# Patient Record
Sex: Female | Born: 1960
Health system: Southern US, Community
[De-identification: ages and names within clinical notes are randomized; demographics above are authoritative.]

## PROBLEM LIST (undated history)

## (undated) DIAGNOSIS — R1310 Dysphagia, unspecified: Principal | ICD-10-CM

## (undated) DIAGNOSIS — K5904 Chronic idiopathic constipation: Secondary | ICD-10-CM

## (undated) DIAGNOSIS — M1990 Unspecified osteoarthritis, unspecified site: Secondary | ICD-10-CM

## (undated) DIAGNOSIS — K219 Gastro-esophageal reflux disease without esophagitis: Secondary | ICD-10-CM

## (undated) HISTORY — DX: Dysphagia, unspecified: R13.10

## (undated) HISTORY — DX: Chronic idiopathic constipation: K59.04

## (undated) HISTORY — PX: KNEE SURGERY: SHX244

---

## 2002-02-13 ENCOUNTER — Ambulatory Visit (HOSPITAL_COMMUNITY): Admission: RE | Admit: 2002-02-13 | Discharge: 2002-02-13 | Payer: Self-pay | Admitting: Orthopaedic Surgery

## 2002-02-13 ENCOUNTER — Encounter: Payer: Self-pay | Admitting: Orthopaedic Surgery

## 2003-01-08 ENCOUNTER — Ambulatory Visit (HOSPITAL_COMMUNITY): Admission: RE | Admit: 2003-01-08 | Discharge: 2003-01-08 | Payer: Self-pay | Admitting: Orthopaedic Surgery

## 2003-01-08 ENCOUNTER — Encounter: Payer: Self-pay | Admitting: Orthopaedic Surgery

## 2003-05-29 ENCOUNTER — Ambulatory Visit (HOSPITAL_COMMUNITY): Admission: RE | Admit: 2003-05-29 | Discharge: 2003-05-29 | Payer: Self-pay | Admitting: Orthopaedic Surgery

## 2003-06-10 ENCOUNTER — Ambulatory Visit (HOSPITAL_COMMUNITY): Admission: RE | Admit: 2003-06-10 | Discharge: 2003-06-10 | Payer: Self-pay | Admitting: Orthopaedic Surgery

## 2004-07-03 IMAGING — US US RETROPERITONEAL COMPLETE
1 series · 14 of 25 positions shown · non-contrast
Comparison: none

CLINICAL DATA: Retention of activity in the renal collecting systems on a bone scan dated 05/29/03.
 ULTRASOUND RENAL  
 Both kidneys are well visualized and appear normal in size and configuration.  The right kidney is 10.6 cm in length and the left kidney is 10.5 cm in length.  There is no evidence of cyst, solid masses, hydronephrosis, renal calculi, or other significant abnormality.
 IMPRESSION
 Normal bilateral renal ultrasound.  The tracer retention on bone scan is felt to have been physiologic.

[Series 1: unknown · 0.34mm/px · 14 of 31 slices shown]
[im 1/31]
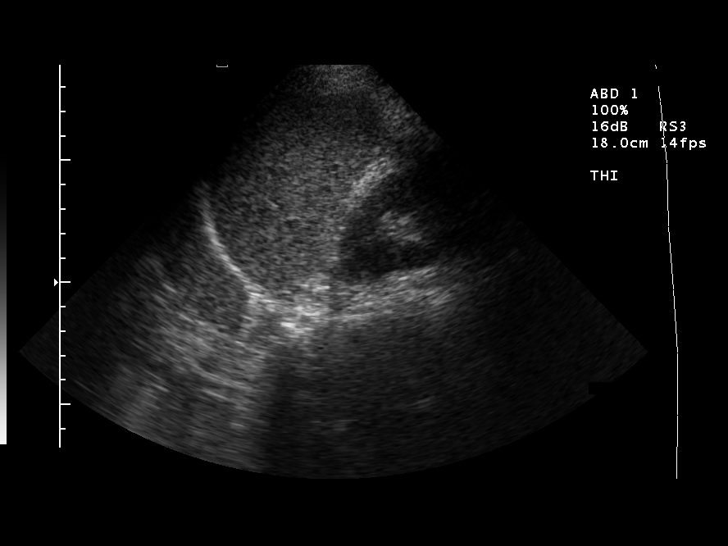
[im 3/31]
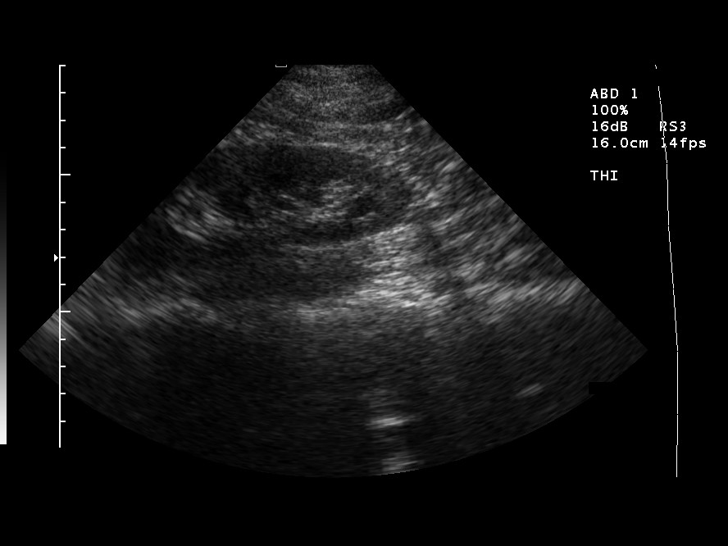
[im 6/31]
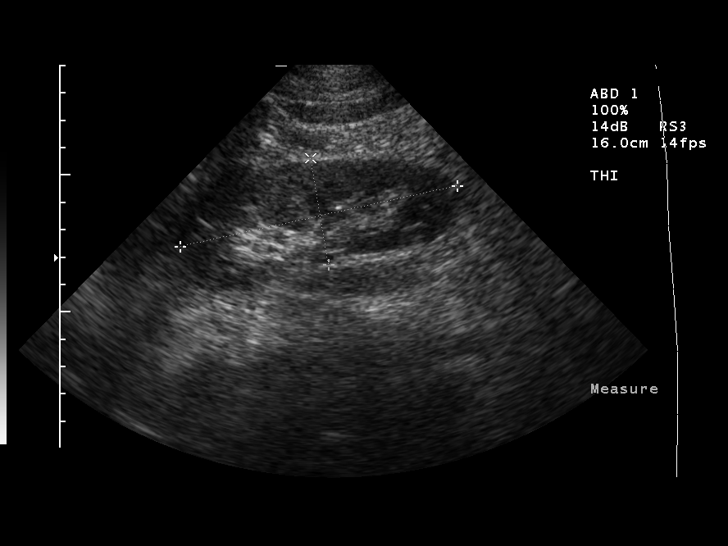
[im 8/31]
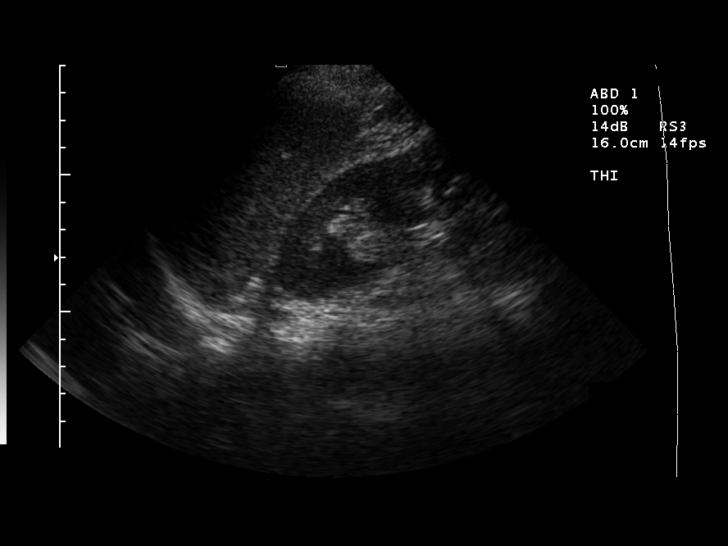
[im 11/31]
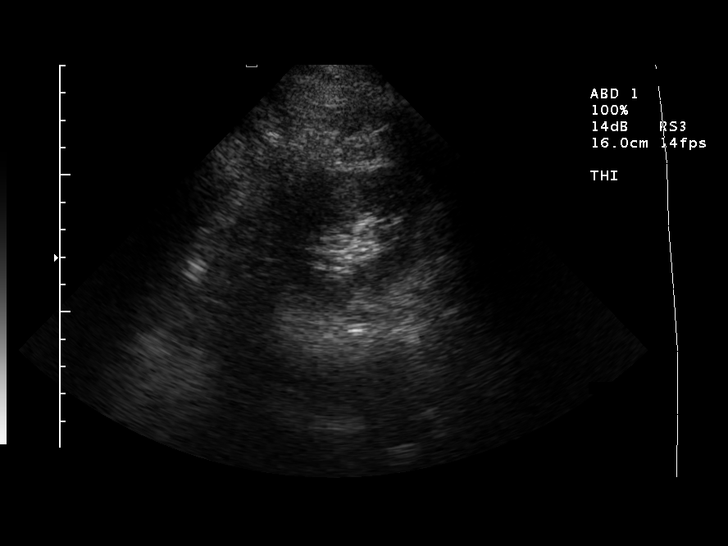
[im 12/31]
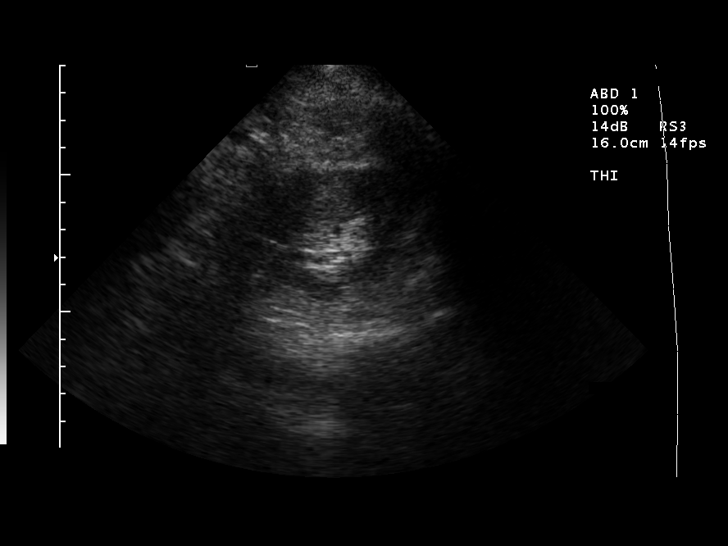
[im 14/31]
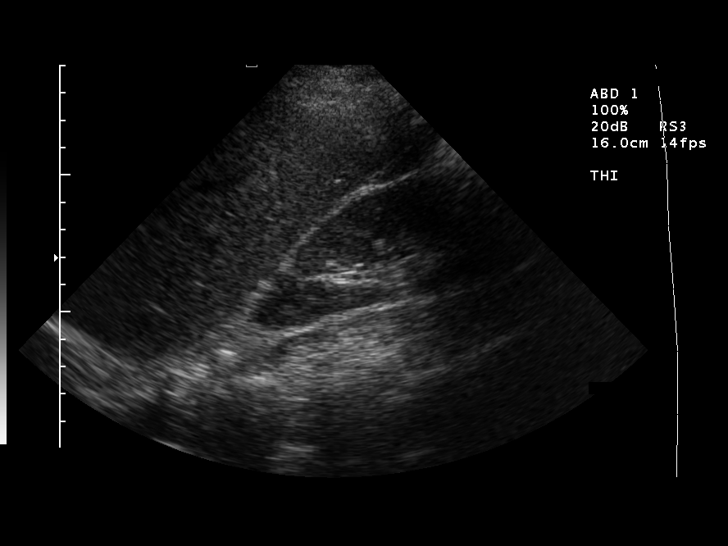
[im 17/31]
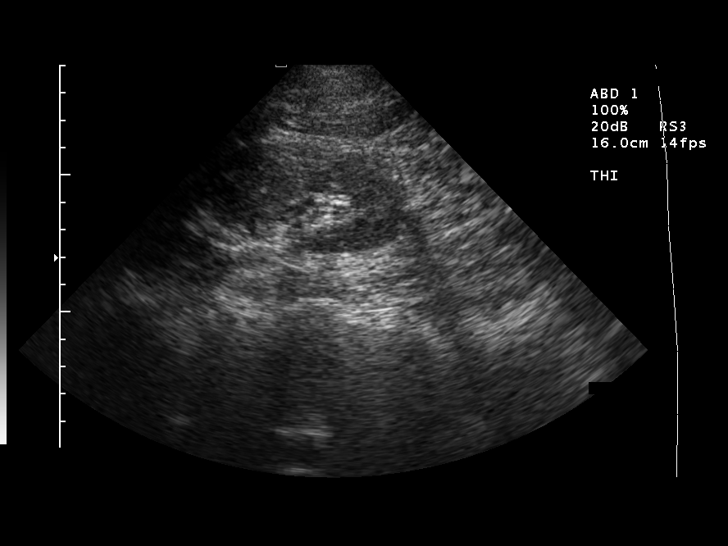
[im 19/31]
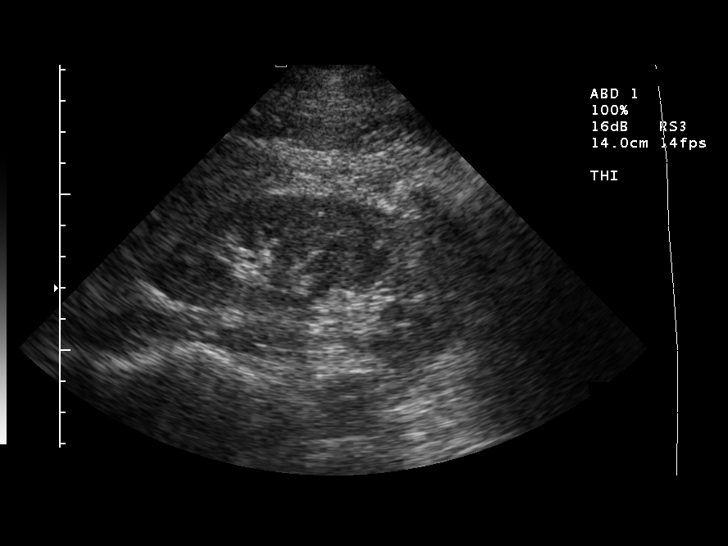
[im 21/31]
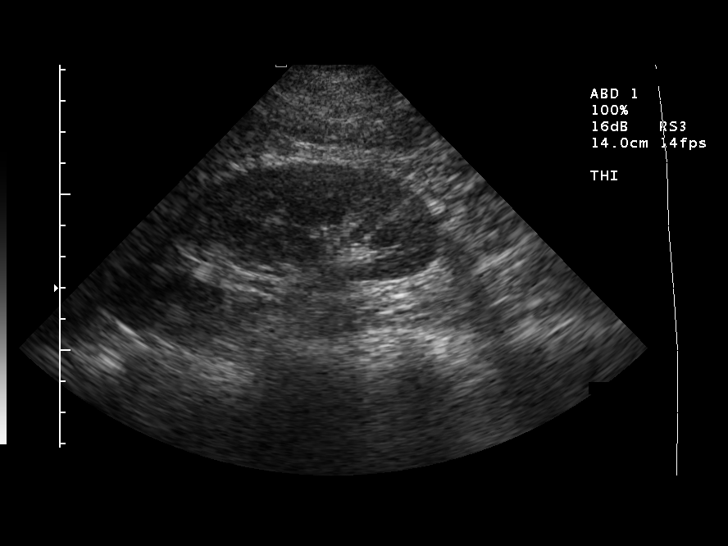
[im 23/31]
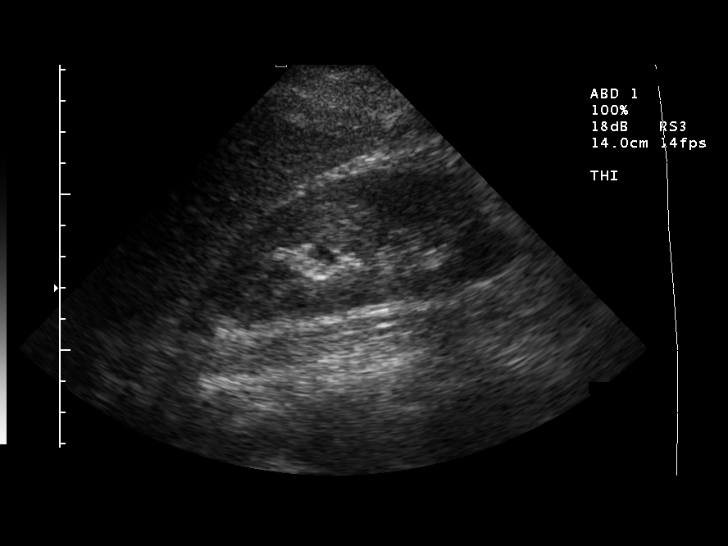
[im 26/31]
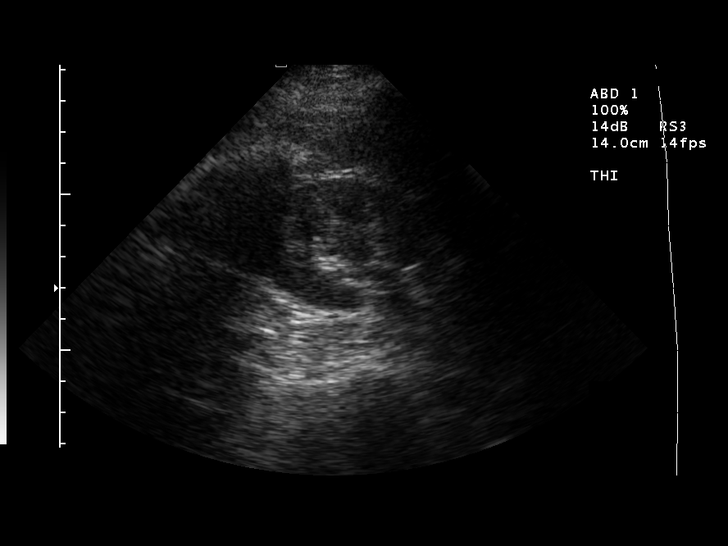
[im 28/31]
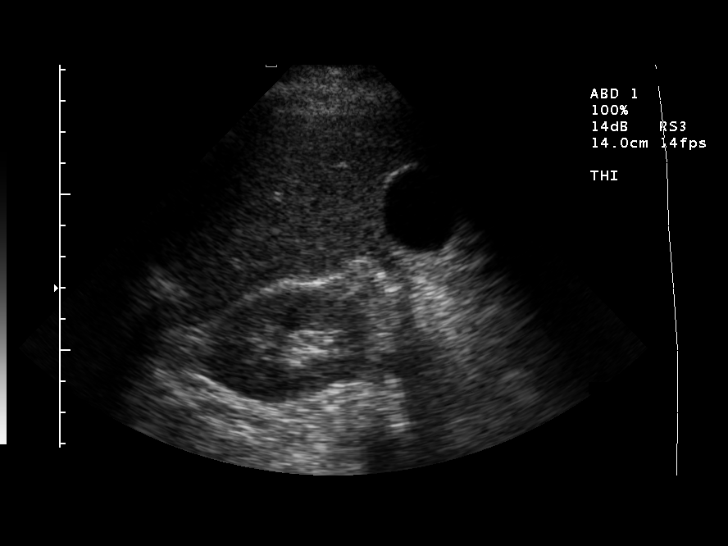
[im 31/31]
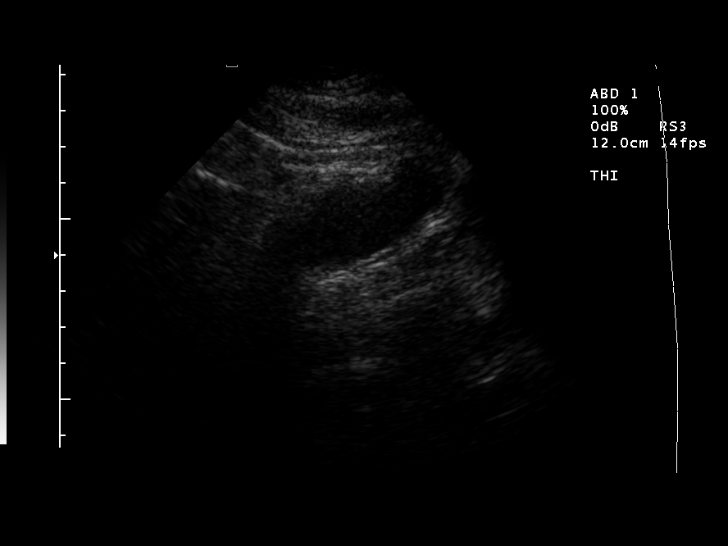

[14 of 25 positions shown; findings below may reference images not displayed]

## 2004-07-31 ENCOUNTER — Ambulatory Visit: Payer: Self-pay | Admitting: Family Medicine

## 2004-09-05 ENCOUNTER — Emergency Department (HOSPITAL_COMMUNITY): Admission: EM | Admit: 2004-09-05 | Discharge: 2004-09-05 | Payer: Self-pay | Admitting: Emergency Medicine

## 2005-06-15 ENCOUNTER — Ambulatory Visit (HOSPITAL_COMMUNITY): Admission: RE | Admit: 2005-06-15 | Discharge: 2005-06-15 | Payer: Self-pay | Admitting: Orthopaedic Surgery

## 2005-07-13 ENCOUNTER — Emergency Department (HOSPITAL_COMMUNITY): Admission: EM | Admit: 2005-07-13 | Discharge: 2005-07-13 | Payer: Self-pay | Admitting: Emergency Medicine

## 2005-07-14 ENCOUNTER — Ambulatory Visit: Payer: Self-pay | Admitting: Family Medicine

## 2005-07-20 ENCOUNTER — Ambulatory Visit: Payer: Self-pay | Admitting: Family Medicine

## 2005-08-04 ENCOUNTER — Ambulatory Visit (HOSPITAL_COMMUNITY): Admission: RE | Admit: 2005-08-04 | Discharge: 2005-08-04 | Payer: Self-pay | Admitting: Family Medicine

## 2005-08-17 ENCOUNTER — Ambulatory Visit: Payer: Self-pay | Admitting: Family Medicine

## 2006-05-24 ENCOUNTER — Encounter (INDEPENDENT_AMBULATORY_CARE_PROVIDER_SITE_OTHER): Payer: Self-pay | Admitting: *Deleted

## 2006-05-24 ENCOUNTER — Ambulatory Visit (HOSPITAL_COMMUNITY): Admission: RE | Admit: 2006-05-24 | Discharge: 2006-05-24 | Payer: Self-pay | Admitting: Orthopaedic Surgery

## 2006-06-02 ENCOUNTER — Encounter (HOSPITAL_COMMUNITY): Admission: RE | Admit: 2006-06-02 | Discharge: 2006-07-02 | Payer: Self-pay | Admitting: Orthopaedic Surgery

## 2006-06-16 ENCOUNTER — Encounter: Payer: Self-pay | Admitting: Family Medicine

## 2006-06-16 LAB — CONVERTED CEMR LAB: Pap Smear: NORMAL

## 2006-07-06 ENCOUNTER — Encounter (HOSPITAL_COMMUNITY): Admission: RE | Admit: 2006-07-06 | Discharge: 2006-08-05 | Payer: Self-pay | Admitting: Orthopaedic Surgery

## 2007-11-15 ENCOUNTER — Encounter: Payer: Self-pay | Admitting: Family Medicine

## 2007-11-15 DIAGNOSIS — Z8669 Personal history of other diseases of the nervous system and sense organs: Secondary | ICD-10-CM

## 2007-11-15 DIAGNOSIS — R519 Headache, unspecified: Secondary | ICD-10-CM | POA: Insufficient documentation

## 2007-11-15 DIAGNOSIS — N926 Irregular menstruation, unspecified: Secondary | ICD-10-CM

## 2007-11-15 DIAGNOSIS — R42 Dizziness and giddiness: Secondary | ICD-10-CM

## 2007-11-15 DIAGNOSIS — R51 Headache: Secondary | ICD-10-CM

## 2009-06-12 ENCOUNTER — Encounter: Payer: Self-pay | Admitting: Family Medicine

## 2010-01-15 ENCOUNTER — Emergency Department (HOSPITAL_COMMUNITY): Admission: EM | Admit: 2010-01-15 | Discharge: 2010-01-16 | Payer: Self-pay | Admitting: Emergency Medicine

## 2010-07-25 ENCOUNTER — Encounter: Payer: Self-pay | Admitting: Orthopaedic Surgery

## 2010-07-26 ENCOUNTER — Encounter: Payer: Self-pay | Admitting: Family Medicine

## 2010-11-20 NOTE — H&P (Signed)
NAME:  Sandra Davies, HOGLE            ACCOUNT NO.:  [id number]   MEDICAL RECORD NO.:  [id number]         PATIENT TYPE:  AMB   LOCATION:  DAY                           FACILITY:  [facility]   PHYSICIAN:  J. Darreld Mclean, M.D. DATE OF BIRTH:  08/14/1960   DATE OF ADMISSION:  DATE OF DISCHARGE:  LH                                HISTORY & PHYSICAL   CHIEF COMPLAINT:  Right knee pain.   HISTORY OF PRESENT ILLNESS:  The patient is a 50 year old female with pain  and tenderness in her right knee.  She has had pain and tenderness in her  knee for about 6 to 8 weeks.  She had MRI of the knee done on the 17th of  October showing a tear of the anterior and posterior horn of the lateral  meniscus with a small joint effusion.  Mild degenerative joint disease was  present as well.  I informed her of the findings and she wanted to hold off  on the surgery.  I saw her in the office on the 15th of November.  She wants  to go ahead and have the surgery right before Thanksgiving.  I have  __________ 2.4.  I have gone over the risks and imponderables of the  procedure.  She appears to understand.  This is an outpatient procedure.   PAST MEDICAL HISTORY:  Negative.  She denies heart disease, lung disease,  kidney disease, stroke, paralysis, weakness, hypertension, diabetes, cancer,  polio or ulcer disease.  She does have a history of degenerative joint  disease of the left knee.  She has a history of chronic back pain.   ALLERGIES:  She has no allergies.   CURRENT MEDICATIONS:  Vicodin ES which I have just given her recently, one  every 4 hours p.r.n. pain and Flexeril 5 mg one t.i.d. p.r.n. spasms.  She  also takes Mobic 15 mg daily.  She does not smoke.   SOCIAL HISTORY:  She denies alcoholic beverages.  She is a Recruitment consultant.  She lives in [place].  Dr. Donzetta Sprung is her  family doctor.   PAST SURGICAL HISTORY:  She denies any previous surgery.   FAMILY HISTORY:  Lung  disease runs in the family.   PHYSICAL EXAMINATION:  GENERAL: The patient is alert, cooperative, oriented.  HEENT:  Negative.  NECK:  Supple.  LUNGS:  Clear to P&A.  HEART:  Regular rate and rhythm without murmur.  ABDOMEN:  Soft, nontender, without masses.  EXTREMITIES:  The right knee has mild effusion, pain and tenderness with a  classic Murray laterally.  Range of motion of the knee is from 0 to 95 with  some tenderness.  Gait is good.  Other extremities are within normal limits.  CNS:  Intact.  SKIN:  Intact.   IMPRESSION:  1. Tear right knee lateral meniscus.  2. History of degenerative joint disease left knee.  3. History of chronic back pain.   PLAN:  Operative arthroscopy of the right knee, partial lateral  meniscectomy.  I discussed with the patient the planned procedure, risks and  imponderables.  She understands and agrees to the procedure as outlined.  Labs are pending.                                            ______________________________  J. Darreld Mclean, M.D.     JWK/MEDQ  D:  05/19/2006  T:  05/20/2006  Job:  [id number]

## 2010-11-20 NOTE — Op Note (Signed)
NAME:  Sandra Davies, Sandra Davies            ACCOUNT NO.:  [id number]   MEDICAL RECORD NO.:  [id number]          PATIENT TYPE:  AMB   LOCATION:  DAY                           FACILITY:  [facility]   PHYSICIAN:  J. Darreld Mclean, M.D. DATE OF BIRTH:  04/28/1961   DATE OF PROCEDURE:  05/24/2006  DATE OF DISCHARGE:                                 OPERATIVE REPORT   PREOPERATIVE DIAGNOSIS:  Tear lateral meniscus right knee.   POSTOPERATIVE DIAGNOSIS:  Tear lateral meniscus right knee.   PROCEDURE:  1. Operative arthroscopy of the right knee.  2. Partial lateral meniscectomy.   ANESTHESIA:  General.   SURGEON:  Keeling.   TOURNIQUET TIME:  Twenty minutes.   No drains.   INDICATION:  Patient is a 50 year old female with pain and tenderness in her  right knee.  An MRI shows tear of the lateral meniscus right knee.  She has  giving way with the knee pain, not improved by conservative treatment.  Surgery has been recommended.  Risks and imponderables of the procedure have  been discussed and the patient appears to understand and agrees to procedure  as outlined.   DESCRIPTION OF PROCEDURE:  The patient was seen in the holding area.  The  right knee was identified as the correct surgical site.  I placed a marker  on the right knee, she placed a marker on the right knee.  Then brought back  to the operating room, placed supine on the operating room table.  General  anesthesia was obtained.  A tourniquet and leg holder placed deflated right  upper thigh.  She was prepped and draped in the usual manner.  We had a time-  out identifying Ms. Sytsma as the patient and the right knee was correct  surgical site.  The leg was elevated, wrapped circumferentially with an  Esmarch bandage after being prepped and draped.  An inflow cannula inserted  medially, lactated Ringers inserted in the knee by an infusion pump.  Arthroscope inserted laterally.   FINDINGS:  Suprapatellar pouch had some mild synovitis,  mild __________  surface patella medially.  The articular surfaces looked very good.  There  was some grade 2 changes, no tear of the meniscus.  Laterally, the meniscus  was torn in the mid portion and more on the anterior portion of lateral  meniscus.  There were grade 2-3 changes of the femoral condyle and tibial  plateau.  No loose bodies were present.   DESCRIPTION OF PROCEDURE:  Using a meniscal shaver, a punch, I was able to  remove the meniscus and a good smooth contour.  The knee was systematically  examined, no new pathology found.  Permanent pictures were taken.  The knee  was irrigated with the remaining part of lactated Ringer's.  The wound was  reapproximated using #3-0 nylon interrupted vertical mattress manner.  Marcaine 0.25% instilled into each portal.  Tourniquet deflated for 20  minutes.  Sterile dressing applied, bulky dressing applied, knee immobilizer  applied.  A prescription for Vicodin ES for pain has been given.  If any  difficulty, she is to contact me  through the office hospital beeper system;  otherwise, I will see her in 10 days to 2 weeks.  Physical therapy has been  arranged.  Phone numbers have been provided.           ______________________________  Shela Commons. Darreld Mclean, M.D.     JWK/MEDQ  D:  05/24/2006  T:  05/24/2006  Job:  [id number]

## 2011-10-07 ENCOUNTER — Other Ambulatory Visit: Payer: Self-pay | Admitting: Obstetrics and Gynecology

## 2011-10-07 ENCOUNTER — Other Ambulatory Visit (HOSPITAL_COMMUNITY)
Admission: RE | Admit: 2011-10-07 | Discharge: 2011-10-07 | Disposition: A | Discharge status: Home / Self Care | Payer: Self-pay | Source: Ambulatory Visit | Attending: Obstetrics and Gynecology | Admitting: Obstetrics and Gynecology

## 2011-10-07 DIAGNOSIS — Z01419 Encounter for gynecological examination (general) (routine) without abnormal findings: Secondary | ICD-10-CM | POA: Insufficient documentation

## 2013-07-05 DIAGNOSIS — R1310 Dysphagia, unspecified: Secondary | ICD-10-CM

## 2013-07-05 HISTORY — DX: Dysphagia, unspecified: R13.10

## 2013-08-02 ENCOUNTER — Emergency Department (HOSPITAL_COMMUNITY): Payer: BC Managed Care – PPO

## 2013-08-02 ENCOUNTER — Emergency Department (HOSPITAL_COMMUNITY)
Admission: EM | Admit: 2013-08-02 | Discharge: 2013-08-02 | Disposition: A | Discharge status: Home / Self Care | Payer: BC Managed Care – PPO | Attending: Emergency Medicine | Admitting: Emergency Medicine

## 2013-08-02 ENCOUNTER — Encounter (HOSPITAL_COMMUNITY): Payer: Self-pay | Admitting: Emergency Medicine

## 2013-08-02 DIAGNOSIS — J111 Influenza due to unidentified influenza virus with other respiratory manifestations: Secondary | ICD-10-CM

## 2013-08-02 DIAGNOSIS — F17200 Nicotine dependence, unspecified, uncomplicated: Secondary | ICD-10-CM | POA: Insufficient documentation

## 2013-08-02 DIAGNOSIS — J029 Acute pharyngitis, unspecified: Secondary | ICD-10-CM | POA: Insufficient documentation

## 2013-08-02 DIAGNOSIS — R69 Illness, unspecified: Secondary | ICD-10-CM

## 2013-08-02 MED ORDER — GUAIFENESIN ER 1200 MG PO TB12: 1.0000 | ORAL_TABLET | Freq: Two times a day (BID) | ORAL | Status: DC

## 2013-08-02 MED ORDER — PROMETHAZINE-DM 6.25-15 MG/5ML PO SYRP: 5.0000 mL | ORAL_SOLUTION | Freq: Four times a day (QID) | ORAL | Status: DC | PRN

## 2013-08-02 MED ORDER — IBUPROFEN 800 MG PO TABS: 800.0000 mg | ORAL_TABLET | Freq: Three times a day (TID) | ORAL | Status: DC | PRN

## 2013-08-02 MED ORDER — ALBUTEROL SULFATE HFA 108 (90 BASE) MCG/ACT IN AERS
2.0000 | INHALATION_SPRAY | Freq: Four times a day (QID) | RESPIRATORY_TRACT | Status: DC
Start: 2013-08-02 — End: 2013-08-02
  Administered 2013-08-02: 2 via RESPIRATORY_TRACT
  Filled 2013-08-02: qty 6.7

## 2013-08-02 NOTE — ED Notes (Signed)
Sick for 2 weeks, cough, sore throat, nasal congestion.  Last vomiting, 2 days ago.

## 2013-08-02 NOTE — Discharge Instructions (Signed)
Return here as needed.  Increase your fluid intake, rest as much as possible °

## 2013-08-02 NOTE — ED Provider Notes (Signed)
CSN: [id number]     Arrival date & time 08/02/13  1408 History   First MD Initiated Contact with Patient 08/02/13 1441     Chief Complaint  Patient presents with  . Influenza   (Consider location/radiation/quality/duration/timing/severity/associated sxs/prior Treatment) HPI Patient presents emergency department with a two-week history of cough, runny nose, sore throat, and body aches.  The patient, states, that coughing makes her feel worse.  Patient, states, that seems to make her condition, better.  The patient has taken over-the-counter cough, and cold medications without relief.  Patient denies use of breath, nausea, vomiting, abdominal pain, back pain, dysuria, weakness, dizziness, fever, or syncope.  The patient, states, that she has coughed to the point where she has vomited. History reviewed. No pertinent past medical history. Past Surgical History  Procedure Laterality Date  . Knee surgery     History reviewed. No pertinent family history. History  Substance Use Topics  . Smoking status: Current Every Day Smoker  . Smokeless tobacco: Not on file  . Alcohol Use: Yes   OB History   Grav Para Term Preterm Abortions TAB SAB Ect Mult Living                 Review of Systems All other systems negative except as documented in the HPI. All pertinent positives and negatives as reviewed in the HPI. Allergies  Review of patient's allergies indicates no known allergies.  Home Medications   Current Outpatient Rx  Name  Route  Sig  Dispense  Refill  . diphenhydrAMINE (BENADRYL) 25 MG tablet   Oral   Take 25 mg by mouth every 6 (six) hours as needed for allergies.          BP 135/86  Pulse 90  Temp(Src) 98.8 F (37.1 C) (Oral)  Resp 20  Ht 5' 7.5" (1.715 m)  Wt 160 lb (72.576 kg)  BMI 24.68 kg/m2  SpO2 100%  LMP 07/02/2013 Physical Exam  Nursing note and vitals reviewed. Constitutional: She is oriented to person, place, and time. She appears well-developed and  well-nourished. No distress.  HENT:  Head: Normocephalic and atraumatic.  Mouth/Throat: Oropharynx is clear and moist.  Eyes: Pupils are equal, round, and reactive to light.  Neck: Normal range of motion. Neck supple.  Cardiovascular: Normal rate, regular rhythm and normal heart sounds.  Exam reveals no gallop and no friction rub.   No murmur heard. Pulmonary/Chest: Effort normal and breath sounds normal. No respiratory distress. She has no wheezes. She has no rales.  Neurological: She is alert and oriented to person, place, and time. She exhibits normal muscle tone. Coordination normal.  Skin: Skin is warm and dry. No rash noted. No erythema.    ED Course  Procedures (including critical care time) Patient most likely has an influenza-like illness to be treated and advised to increase her fluid intake.  Told to return here as needed.   Carlyle Dollyhristopher W Leya Paige, PA-C 08/02/13 1542

## 2013-08-02 NOTE — ED Notes (Signed)
Resp paged for inhaler. 

## 2013-08-03 NOTE — ED Provider Notes (Signed)
Medical screening examination/treatment/procedure(s) were performed by non-physician practitioner and as supervising physician I was immediately available for consultation/collaboration.  EKG Interpretation   None         Elsy Chiang W. Shaquil Aldana, MD 08/03/13 0716 

## 2014-07-19 ENCOUNTER — Other Ambulatory Visit (HOSPITAL_COMMUNITY): Payer: Self-pay | Admitting: Internal Medicine

## 2014-07-19 DIAGNOSIS — Z1231 Encounter for screening mammogram for malignant neoplasm of breast: Secondary | ICD-10-CM

## 2014-07-22 ENCOUNTER — Encounter: Payer: Self-pay | Admitting: Gastroenterology

## 2014-07-24 ENCOUNTER — Ambulatory Visit (HOSPITAL_COMMUNITY): Payer: Self-pay

## 2014-08-21 ENCOUNTER — Ambulatory Visit (INDEPENDENT_AMBULATORY_CARE_PROVIDER_SITE_OTHER): Payer: BLUE CROSS/BLUE SHIELD | Admitting: Gastroenterology

## 2014-08-21 ENCOUNTER — Other Ambulatory Visit: Payer: Self-pay

## 2014-08-21 ENCOUNTER — Encounter: Payer: Self-pay | Admitting: Gastroenterology

## 2014-08-21 ENCOUNTER — Ambulatory Visit (HOSPITAL_COMMUNITY)
Admission: RE | Admit: 2014-08-21 | Discharge: 2014-08-21 | Disposition: A | Discharge status: Home / Self Care | Payer: BLUE CROSS/BLUE SHIELD | Source: Ambulatory Visit | Attending: Internal Medicine | Admitting: Internal Medicine

## 2014-08-21 VITALS — BP 147/94 | HR 71 | Temp 97.7°F | Ht 67.0 in | Wt 162.8 lb

## 2014-08-21 DIAGNOSIS — Z1231 Encounter for screening mammogram for malignant neoplasm of breast: Secondary | ICD-10-CM | POA: Diagnosis present

## 2014-08-21 DIAGNOSIS — K5901 Slow transit constipation: Secondary | ICD-10-CM

## 2014-08-21 DIAGNOSIS — K5900 Constipation, unspecified: Secondary | ICD-10-CM | POA: Insufficient documentation

## 2014-08-21 DIAGNOSIS — R1310 Dysphagia, unspecified: Secondary | ICD-10-CM

## 2014-08-21 NOTE — Assessment & Plan Note (Addendum)
SYMPTOMS LIKELY DUE TO STRICTURE OR RING, LESS LIKELY GE JXN TUMOR OR ESOPHAGEAL CANCER  EGD/DIL MAR 1-DISCUSSED PROCEDURE, BENEFITS, & RISKS: < 1% chance of medication reaction, PERFORATION, OR bleeding.  SOFT DIET.  MEATS SHOULD BE CHOPPED OR GROUND ONLY. DO NOT EAT CHUNKS OF ANYTHING. FOLLOW UP IN 4 MOS.

## 2014-08-21 NOTE — Patient Instructions (Signed)
UPPER ENDOSCOPY TO DILATE YOUR ESOPHAGUS ON  MAR 1  FOLLOW A SOFT MECHANICAL DIET. SEE INFO BELOW.  MEATS SHOULD BE CHOPPED OR GROUND ONLY. DO NOT EAT CHUNKS OF ANYTHING.   AVOID LARGE PILLS.  DRINK WATER TO KEEP YOUR URINE LIGHT YELLOW.  USE MILK OF MAGNESIA LIQUID AS NEEDED FOR CONSTIPATION. YOU CAN USE A DOSE EVERY DAY OR EVERY OTHER DAY.  FOLLOW UP IN 4 MOS.   SOFT MECHANICAL DIET This SOFT MECHANICAL DIET is restricted to:  Foods that are moist, soft-textured, and easy to chew and swallow.   Meats that are ground or are minced no larger than one-quarter inch pieces. Meats are moist with gravy or sauce added.   Foods that do not include bread or bread-like textures except soft pancakes, well-moistened with syrup or sauce.   Textures with some chewing ability required.   Casseroles without rice.   Cooked vegetables that are less than half an inch in size and easily mashed with a fork. No cooked corn, peas, broccoli, cauliflower, cabbage, Brussels sprouts, asparagus, or other fibrous, non-tender or rubbery cooked vegetables.   Canned fruit except for pineapple. Fruit must be cut into pieces no larger than half an inch in size.   Foods that do not include nuts, seeds, coconut, or sticky textures.   FOOD TEXTURES FOR DYSPHAGIA DIET LEVEL 2 -SOFT MECHANICAL DIET (includes all foods on Dysphagia Diet Level 1 - Pureed, in addition to the foods listed below)  FOOD GROUP: Breads. RECOMMENDED: Soft pancakes, well-moistened with syrup or sauce.  AVOID: All others.  FOOD GROUP: Cereals.  RECOMMENDED: Cooked cereals with little texture, including oatmeal. Unprocessed wheat bran stirred into cereals for bulk. Note: If thin liquids are restricted, it is important that all of the liquid is absorbed into the cereal.  AVOID: All dry cereals and any cooked cereals that may contain flax seeds or other seeds or nuts. Whole-grain, dry, or coarse cereals. Cereals with nuts, seeds, dried  fruit, and/or coconut.  FOOD GROUP: Desserts. RECOMMENDED: Pudding, custard. Soft fruit pies with bottom crust only. Canned fruit (excluding pineapple). Soft, moist cakes with icing.Frozen malts, milk shakes, frozen yogurt, eggnog, nutritional supplements, ice cream, sherbet, regular or sugar-free gelatin, or any foods that become thin liquid at either room (70 F) or body temperature (98 F).  AVOID: Dry, coarse cakes and cookies. Anything with nuts, seeds, coconut, pineapple, or dried fruit. Breakfast yogurt with nuts. Rice or bread pudding.  FOOD GROUP: Fats. RECOMMENDED: Butter, margarine, cream for cereal (depending on liquid consistency recommendations), gravy, cream sauces, sour cream, sour cream dips with soft additives, mayonnaise, salad dressings, cream cheese, cream cheese spreads with soft additives, whipped toppings.  AVOID: All fats with coarse or chunky additives.  FOOD GROUP: Fruits. RECOMMENDED: Soft drained, canned, or cooked fruits without seeds or skin. Fresh soft and ripe banana. Fruit juices with a small amount of pulp. If thin liquids are restricted, fruit juices should be thickened to appropriate consistency.  AVOID: Fresh or frozen fruits. Cooked fruit with skin or seeds. Dried fruits. Fresh, canned, or cooked pineapple.  FOOD GROUP: Meats and Meat Substitutes. (Meat pieces should not exceed 1/4 of an inch cube and should be tender.) RECOMMENDED: Moistened ground or cooked meat, poultry, or fish. Moist ground or tender meat may be served with gravy or sauce. Casseroles without rice. Moist macaroni and cheese, well-cooked pasta with meat sauce, tuna noodle casserole, soft, moist lasagna. Moist meatballs, meatloaf, or fish loaf. Protein salads, such as tuna  or egg without large chunks, celery, or onion. Cottage cheese, smooth quiche without large chunks. Poached, scrambled, or soft-cooked eggs (egg yolks should not be "runny" but should be moist and able to be mashed with  butter, margarine, or other moisture added to them). (Cook eggs to 160 F or use pasteurized eggs for safety.) Souffls may have small, soft chunks. Tofu. Well-cooked, slightly mashed, moist legumes, such as baked beans. All meats or protein substitutes should be served with sauces or moistened to help maintain cohesiveness in the oral cavity.  AVOID: Dry meats, tough meats (such as bacon, sausage, hot dogs, bratwurst). Dry casseroles or casseroles with rice or large chunks. Peanut butter. Cheese slices and cubes. Hard-cooked or crisp fried eggs. Sandwiches.Pizza.  FOOD GROUP: Potatoes and Starches. RECOMMENDED: Well-cooked, moistened, boiled, baked, or mashed potatoes. Well-cooked shredded hash brown potatoes that are not crisp. (All potatoes need to be moist and in sauces.)Well-cooked noodles in sauce. Spaetzel or soft dumplings that have been moistened with butter or gravy.  AVOID: Potato skins and chips. Fried or French-fried potatoes. Rice.  FOOD GROUP: Soups. RECOMMENDED: Soups with easy-to-chew or easy-to-swallow meats or vegetables: Particle sizes in soups should be less than 1/2 inch. Soups will need to be thickened to appropriate consistency if soup is thinner than prescribed liquid consistency.  AVOID: Soups with large chunks of meat and vegetables. Soups with rice, corn, peas.  FOOD GROUP: Vegetables. RECOMMENDED: All soft, well-cooked vegetables. Vegetables should be less than a half inch. Should be easily mashed with a fork.  AVOID: Cooked corn and peas. Broccoli, cabbage, Brussels sprouts, asparagus, or other fibrous, non-tender or rubbery cooked vegetables.  FOOD GROUP: Miscellaneous. RECOMMENDED: Jams and preserves without seeds, jelly. Sauces, salsas, etc., that may have small tender chunks less than 1/2 inch. Soft, smooth chocolate bars that are easily chewed.  AVOID: Seeds, nuts, coconut, or sticky foods. Chewy candies such as caramels or licorice.

## 2014-08-21 NOTE — Assessment & Plan Note (Signed)
MOST LIKELY FUNCTIONAL.  DRINK WATER TO KEEP YOUR URINE LIGHT YELLOW. EAT FIBER TCS IN NEAR FUTURE FOLLOW UP IN 4 MOS.

## 2014-08-21 NOTE — Progress Notes (Signed)
   Subjective:    Patient ID: Sandra Davies, female    DOB: 02/05/1961, 54 y.o.   MRN: [id number]  HALL, ZACH, MD  HPI Sx for 1 year AND GETTING WORSE. HAVING TROUBLE WITH THINGS COMING BACK UP. IF SHE EATS IT GETS HUNG BUT IT COMES BACK UP. WEIGHT LOSS: 2-3 LBS. PROBLEMS WITH CONSTIPATION. RARE CHEST PAIN. SX STARTED AFTER MUCINEX PILLS. QUIT SMOKING 3 YRS AGO. RARE ETOH. UNABLE TO HAVE KIDS. HALF SIS/BROS(8)  PT DENIES FEVER, CHILLS, HEMATOCHEZIA, nausea, melena, diarrhea,  SHORTNESS OF BREATH, CHANGE IN BOWEL IN HABITS, abdominal pain, OR problems with sedation.  Past Medical History  Diagnosis Date  . Dysphagia 2015  . Constipation - functional     FOR A LONG TIME    Past Surgical History  Procedure Laterality Date  . Knee surgery     No Known Allergies  Current Outpatient Prescriptions  Medication Sig Dispense Refill  . cyclobenzaprine (FLEXERIL) 10 MG tablet Take 10 mg by mouth daily.    . diclofenac (VOLTAREN) 75 MG EC tablet Take 75 mg by mouth 2 (two) times daily.    . diphenhydrAMINE (BENADRYL) 25 MG tablet Take 25 mg by mouth every 6 (six) hours as needed for allergies.    . HYDROcodone-acetaminophen (NORCO) 7.5-325 MG per tablet Take 1 tablet by mouth every 6 (six) hours as needed for moderate pain (as needed).    . ibuprofen (ADVIL,MOTRIN) 800 MG tablet Take 1 tablet (800 mg total) by mouth every 8 (eight) hours as needed. QD:2X   .      .        History  Substance Use Topics  . Smoking status: Former Smoker    Quit date: 08/21/2009  . Smokeless tobacco: Not on file  . Alcohol Use: 0.0 oz/week    0 Standard drinks or equivalent per week   Review of Systems PER HPI OTHERWISE ALL SYSTEMS ARE NEGATIVE.     Objective:   Physical Exam  Constitutional: She is oriented to person, place, and time. She appears well-developed and well-nourished. No distress.  HENT:  Head: Normocephalic and atraumatic.  Mouth/Throat: Oropharynx is clear and moist. No  oropharyngeal exudate.  Eyes: Pupils are equal, round, and reactive to light. No scleral icterus.  Neck: Normal range of motion. Neck supple.  Cardiovascular: Normal rate, regular rhythm and normal heart sounds.   Pulmonary/Chest: Effort normal and breath sounds normal. No respiratory distress.  Abdominal: Soft. Bowel sounds are normal. She exhibits no distension. There is no tenderness.  Musculoskeletal: She exhibits no edema.  Lymphadenopathy:    She has no cervical adenopathy.  Neurological: She is alert and oriented to person, place, and time.  NO FOCAL DEFICITS   Psychiatric:  SLIGHTLY ANXIOUS MOOD, NL AFFECT   Vitals reviewed.         Assessment & Plan:   

## 2014-08-21 NOTE — Progress Notes (Signed)
PATIENT ON RECALL LIST  °

## 2014-08-24 NOTE — Progress Notes (Signed)
CC'ED TO PCP 

## 2014-08-26 ENCOUNTER — Telehealth: Payer: Self-pay | Admitting: Gastroenterology

## 2014-08-26 NOTE — Telephone Encounter (Signed)
PLEASE CALL PT. She only needs to be out of work MAR 1 AND MAR 2.

## 2014-08-26 NOTE — Telephone Encounter (Signed)
Patient called today to ask if SF would know about how many days she would need out of work or if she needed to get FMLA papers. I told her that I would have to ask to find out and [facility] and it may be tomorrow before I know anything, She said that she would just try calling back tomorrow. Please advise.

## 2014-08-26 NOTE — Telephone Encounter (Signed)
Talked with patient and she is going to bring some FMLA papers by here Tuesday for work.

## 2014-08-27 NOTE — Telephone Encounter (Signed)
Called home, many rings and no answer.

## 2014-08-28 NOTE — Telephone Encounter (Signed)
Left the message on Vm that she will only need to be out for the 2 days.

## 2014-09-03 ENCOUNTER — Encounter (HOSPITAL_COMMUNITY): Payer: Self-pay | Admitting: *Deleted

## 2014-09-03 ENCOUNTER — Ambulatory Visit (HOSPITAL_COMMUNITY)
Admission: RE | Admit: 2014-09-03 | Discharge: 2014-09-03 | Disposition: A | Discharge status: Home / Self Care | Payer: BLUE CROSS/BLUE SHIELD | Source: Ambulatory Visit | Attending: Gastroenterology | Admitting: Gastroenterology

## 2014-09-03 ENCOUNTER — Encounter (HOSPITAL_COMMUNITY)
Admission: RE | Disposition: A | Discharge status: Home / Self Care | Payer: Self-pay | Source: Ambulatory Visit | Attending: Gastroenterology

## 2014-09-03 DIAGNOSIS — K2950 Unspecified chronic gastritis without bleeding: Secondary | ICD-10-CM | POA: Diagnosis not present

## 2014-09-03 DIAGNOSIS — K222 Esophageal obstruction: Secondary | ICD-10-CM

## 2014-09-03 DIAGNOSIS — R1310 Dysphagia, unspecified: Secondary | ICD-10-CM | POA: Diagnosis present

## 2014-09-03 DIAGNOSIS — Z7982 Long term (current) use of aspirin: Secondary | ICD-10-CM | POA: Diagnosis not present

## 2014-09-03 DIAGNOSIS — Z87891 Personal history of nicotine dependence: Secondary | ICD-10-CM | POA: Insufficient documentation

## 2014-09-03 DIAGNOSIS — K5900 Constipation, unspecified: Secondary | ICD-10-CM | POA: Insufficient documentation

## 2014-09-03 HISTORY — PX: ESOPHAGOGASTRODUODENOSCOPY: SHX5428

## 2014-09-03 HISTORY — DX: Unspecified osteoarthritis, unspecified site: M19.90

## 2014-09-03 HISTORY — PX: ESOPHAGEAL DILATION: SHX303

## 2014-09-03 SURGERY — EGD (ESOPHAGOGASTRODUODENOSCOPY)
Anesthesia: Moderate Sedation

## 2014-09-03 MED ORDER — STERILE WATER FOR IRRIGATION IR SOLN
Status: DC | PRN
  Administered 2014-09-03: 15:00:00

## 2014-09-03 MED ORDER — MINERAL OIL PO OIL
TOPICAL_OIL | ORAL | Status: AC
  Filled 2014-09-03: qty 30

## 2014-09-03 MED ORDER — LIDOCAINE VISCOUS 2 % MT SOLN
OROMUCOSAL | Status: DC | PRN
  Administered 2014-09-03: 1 via OROMUCOSAL

## 2014-09-03 MED ORDER — OMEPRAZOLE 20 MG PO CPDR: DELAYED_RELEASE_CAPSULE | ORAL | Status: DC

## 2014-09-03 MED ORDER — LIDOCAINE VISCOUS 2 % MT SOLN
OROMUCOSAL | Status: AC
  Filled 2014-09-03: qty 15

## 2014-09-03 MED ORDER — MEPERIDINE HCL 100 MG/ML IJ SOLN
INTRAMUSCULAR | Status: DC | PRN
  Administered 2014-09-03 (×3): 25 mg via INTRAVENOUS

## 2014-09-03 MED ORDER — MIDAZOLAM HCL 5 MG/5ML IJ SOLN
INTRAMUSCULAR | Status: DC | PRN
  Administered 2014-09-03 (×2): 1 mg via INTRAVENOUS
  Administered 2014-09-03: 2 mg via INTRAVENOUS
  Administered 2014-09-03: 1 mg via INTRAVENOUS
  Administered 2014-09-03: 2 mg via INTRAVENOUS

## 2014-09-03 MED ORDER — SODIUM CHLORIDE 0.9 % IV SOLN
INTRAVENOUS | Status: DC
  Administered 2014-09-03: 14:00:00 via INTRAVENOUS

## 2014-09-03 NOTE — H&P (View-Only) (Signed)
   Subjective:    Patient ID: Sandra Davies, female    DOB: 04/07/1961, 54 y.o.   MRN: [id number]  [name], ZACH, MD  HPI Sx for 1 year AND GETTING WORSE. HAVING TROUBLE WITH THINGS COMING BACK UP. IF SHE EATS IT GETS HUNG BUT IT COMES BACK UP. WEIGHT LOSS: 2-3 LBS. PROBLEMS WITH CONSTIPATION. RARE CHEST PAIN. SX STARTED AFTER MUCINEX PILLS. QUIT SMOKING 3 YRS AGO. RARE ETOH. UNABLE TO HAVE KIDS. HALF SIS/BROS(8)  PT DENIES FEVER, CHILLS, HEMATOCHEZIA, nausea, melena, diarrhea,  SHORTNESS OF BREATH, CHANGE IN BOWEL IN HABITS, abdominal pain, OR problems with sedation.  Past Medical History  Diagnosis Date  . Dysphagia 2015  . Constipation - functional     FOR A LONG TIME    Past Surgical History  Procedure Laterality Date  . Knee surgery     No Known Allergies  Current Outpatient Prescriptions  Medication Sig Dispense Refill  . cyclobenzaprine (FLEXERIL) 10 MG tablet Take 10 mg by mouth daily.    . diclofenac (VOLTAREN) 75 MG EC tablet Take 75 mg by mouth 2 (two) times daily.    . diphenhydrAMINE (BENADRYL) 25 MG tablet Take 25 mg by mouth every 6 (six) hours as needed for allergies.    Marland Kitchen. HYDROcodone-acetaminophen (NORCO) 7.5-325 MG per tablet Take 1 tablet by mouth every 6 (six) hours as needed for moderate pain (as needed).    Marland Kitchen. ibuprofen (ADVIL,MOTRIN) 800 MG tablet Take 1 tablet (800 mg total) by mouth every 8 (eight) hours as needed. QD:2X   .      .        History  Substance Use Topics  . Smoking status: Former Smoker    Quit date: 08/21/2009  . Smokeless tobacco: Not on file  . Alcohol Use: 0.0 oz/week    0 Standard drinks or equivalent per week   Review of Systems PER HPI OTHERWISE ALL SYSTEMS ARE NEGATIVE.     Objective:   Physical Exam  Constitutional: She is oriented to person, place, and time. She appears well-developed and well-nourished. No distress.  HENT:  Head: Normocephalic and atraumatic.  Mouth/Throat: Oropharynx is clear and moist. No  oropharyngeal exudate.  Eyes: Pupils are equal, round, and reactive to light. No scleral icterus.  Neck: Normal range of motion. Neck supple.  Cardiovascular: Normal rate, regular rhythm and normal heart sounds.   Pulmonary/Chest: Effort normal and breath sounds normal. No respiratory distress.  Abdominal: Soft. Bowel sounds are normal. She exhibits no distension. There is no tenderness.  Musculoskeletal: She exhibits no edema.  Lymphadenopathy:    She has no cervical adenopathy.  Neurological: She is alert and oriented to person, place, and time.  NO FOCAL DEFICITS   Psychiatric:  SLIGHTLY ANXIOUS MOOD, NL AFFECT   Vitals reviewed.         Assessment & Plan:

## 2014-09-03 NOTE — Progress Notes (Signed)
[facility], therefore Marcelino DusterMichelle from Pharmacy restocked Demerol 100 mg vial to [facility] and Jacobs EngineeringMegan White, RN witnessed waste of 3 mg of Versed.

## 2014-09-03 NOTE — Discharge Instructions (Signed)
I dilated your esophagus. You have a stricture near the base of your esophagus.  You have mild gastritis & A GASTRIC POLYP. I biopsied your stomach.   CONTINUE A SOFT MECHANICAL DIET. MEATS SHOULD BE BAKED, BROILED, OR BOILED.  MEATS SHOULD BE CHOPPED OR GROUND ONLY. DO NOT EAT CHUNKS OF ANYTHING.  START OMEPRAZOLE.  TAKE 30 MINUTES PRIOR TO YOUR MEALS TWICE DAILY.  HOLD IBUPROFEN FOR 7 DAYS.  YOUR BIOPSY RESULTS WILL BE AVAILABLE IN MY CHART AFTER MAR 3 OR MY OFFICE WILL CONTACT YOU IN 10-14 DAYS WITH YOUR RESULTS.   REPEAT UPPER ENDOSCOPY TO DILATE YOUR ESOPHAGUS IN 3 WEEKS.  FOLLOW UP IN 4 MOS.  UPPER ENDOSCOPY AFTER CARE Read the instructions outlined below and refer to this sheet in the next week. These discharge instructions provide you with general information on caring for yourself after you leave the hospital. While your treatment has been planned according to the most current medical practices available, unavoidable complications occasionally occur. If you have any problems or questions after discharge, call DR. Jannell Franta, [phone].  ACTIVITY  You may resume your regular activity, but move at a slower pace for the next 24 hours.   Take frequent rest periods for the next 24 hours.   Walking will help get rid of the air and reduce the bloated feeling in your belly (abdomen).   No driving for 24 hours (because of the medicine (anesthesia) used during the test).   You may shower.   Do not sign any important legal documents or operate any machinery for 24 hours (because of the anesthesia used during the test).    NUTRITION  Drink plenty of fluids.   You may resume your normal diet as instructed by your doctor.   Begin with a light meal and progress to your normal diet. Heavy or fried foods are harder to digest and may make you feel sick to your stomach (nauseated).   Avoid alcoholic beverages for 24 hours or as instructed.    MEDICATIONS  You may resume your  normal medications.   WHAT YOU CAN EXPECT TODAY  Some feelings of bloating in the abdomen.   Passage of more gas than usual.    IF YOU HAD A BIOPSY TAKEN DURING THE UPPER ENDOSCOPY:  Eat a soft diet IF YOU HAVE NAUSEA, BLOATING, ABDOMINAL PAIN, OR VOMITING.    FINDING OUT THE RESULTS OF YOUR TEST Not all test results are available during your visit. DR. Darrick Penna WILL CALL YOU WITHIN 14 DAYS OF YOUR PROCEDUE WITH YOUR RESULTS. Do not assume everything is normal if you have not heard from DR. Zaheer Wageman, CALL HER OFFICE AT [phone].  SEEK IMMEDIATE MEDICAL ATTENTION AND CALL THE OFFICE: [phone] IF:  You have more than a spotting of blood in your stool.   Your belly is swollen (abdominal distention).   You are nauseated or vomiting.   You have a temperature over 101F.   You have abdominal pain or discomfort that is severe or gets worse throughout the day.  Gastritis  Gastritis is an inflammation (the body's way of reacting to injury and/or infection) of the stomach. It is often caused by viral or bacterial (germ) infections. It can also be caused BY ASPIRIN, BC/GOODY POWDER'S, (IBUPROFEN) MOTRIN, OR ALEVE (NAPROXEN), chemicals (including alcohol), SPICY FOODS, and medications. This illness may be associated with generalized malaise (feeling tired, not well), UPPER ABDOMINAL STOMACH cramps, and fever. One common bacterial cause of gastritis is an organism known as H.  Pylori. This can be treated with antibiotics.   ESOPHAGEAL STRICTURE  Esophageal strictures can be caused by stomach acid backing up into the tube that carries food from the mouth down to the stomach (lower esophagus).  TREATMENT There are a number of medicines used to treat reflux/stricture, including: Antacids.  Proton-pump inhibitors: OMEPRAZOLE ZANTAC OR PEPCID  HOME CARE INSTRUCTIONS Eat 2-3 hours before going to bed.  Try to reach and maintain a healthy weight.  Do not eat just a few very large meals.  Instead, eat 4 TO 6 smaller meals throughout the day.  Try to identify foods and beverages that make your symptoms worse, and avoid these.  Avoid tight clothing.  Do not exercise right after eating.

## 2014-09-03 NOTE — Interval H&P Note (Signed)
History and Physical Interval Note:  09/03/2014 3:08 PM  Barkley BrunsJacqueline D Rogstad  has presented today for surgery, with the diagnosis of dysphagia  The various methods of treatment have been discussed with the patient and family. After consideration of risks, benefits and other options for treatment, the patient has consented to  Procedure(s) with comments: ESOPHAGOGASTRODUODENOSCOPY (EGD) (N/A) - 230  ESOPHAGEAL DILATION (N/A) as a surgical intervention .  The patient's history has been reviewed, patient examined, no change in status, stable for surgery.  I have reviewed the patient's chart and labs.  Questions were answered to the patient's satisfaction.     Eaton CorporationSandi Pranish Akhavan

## 2014-09-04 ENCOUNTER — Encounter (HOSPITAL_COMMUNITY): Payer: Self-pay | Admitting: Gastroenterology

## 2014-09-04 NOTE — Op Note (Signed)
NAME:  Sandra Davies, Sandra Davies            ACCOUNT NO.:  [id number]  MEDICAL RECORD NO.:  [id number]  LOCATION:  [facility]                          FACILITY:  [facility]  PHYSICIAN:  Jonette EvaSandi Fields, M.D.     DATE OF BIRTH:  Nov 13, 1960  DATE OF PROCEDURE:  09/03/2014 DATE OF DISCHARGE:  09/03/2014                              OPERATIVE REPORT  FINAL PATH REPORT: GASTRITIS. REPEAT EGD/DILATION W/ ESOPHAGEAL BIOPSY IN 3 WEEKS.   REFERRING PHYSICIAN:  Catalina PizzaZach Hall, M.D.  PROCEDURE:  Esophagogastroduodenoscopy with Savary dilation and cold forceps biopsy.  INDICATION FOR EXAM:  Sandra Davies is a 54 year old female who uses ibuprofen and Voltaren.  She presents with solid dysphagia and intermittent food impaction.  FINDINGS: 1. Distal esophageal stricture narrowing the lumen to approximately 10     mm.  The esophagus was dilated from 10 mm to 12.8 mm.  Trace heme     was seen on the dilators.  The dilators passed with mild     resistance. 2. Diffuse erythema and edema of the gastric mucosa.  Cold forceps     biopsies obtained to evaluate for H. pylori gastritis. 3. Normal duodenal bulb and second portion of the duodenum.  IMPRESSION: 1. Dysphagia due to distal esophageal stricture. 2. Moderate gastritis.  RECOMMENDATIONS: 1. Continue soft mechanical diet.  Meat should be chopped or ground     only.  She should eat meats that are baked, boiled, or broiled.     She should not eat junks of anything. 2. Start omeprazole.  Take 30 minutes prior to meals twice daily. 3. Hold aspirin for 7 days. 4. Await biopsies. 5. Repeat upper endoscopy to complete dilation in 3 weeks. 6. Follow up in 4 months.  MEDICATIONS: 1. Demerol 100 mg IV. 2. Versed 7 mg IV.  PROCEDURE TECHNIQUE:  Physical exam was performed.  Informed consent was obtained from the patient, explained the benefits, risks, and alternatives to the procedure.  The patient was connected to monitor and placed in left lateral position.  Continuous  oxygen was provided by nasal cannula and IV medicine administered through an indwelling cannula.  After administration of sedation, the patient's esophagus was intubated and scope was advanced under direct visualization to the distal esophagus.  The diagnostic gastroscope was passed with mild resistance.  The scope was advanced under direct visualization to the second portion of the duodenum.  The scope was removed slowly and carefully by examining the color, texture, anatomy, and integrity of the mucosa on the way out.  Prior to removal of the scope, cold forceps biopsies were obtained.  The Savary guidewire was advanced.  A mild superficial tear was noted in the distal esophagus just proximal to the esophageal stricture.  The esophagus was dilated from 10 mm to 12.8 mm.  The dilator and guidewire were removed.  The patient was recovered in endoscopy and discharged home in satisfactory condition.     Jonette EvaSandi Fields, M.D.     SF/MEDQ  D:  09/03/2014  T:  09/04/2014  Job:  [id number]

## 2014-09-05 ENCOUNTER — Other Ambulatory Visit: Payer: Self-pay

## 2014-09-05 ENCOUNTER — Telehealth: Payer: Self-pay

## 2014-09-05 DIAGNOSIS — R1314 Dysphagia, pharyngoesophageal phase: Secondary | ICD-10-CM

## 2014-09-05 NOTE — Telephone Encounter (Signed)
REVIEWED-NO ADDITIONAL RECOMMENDATIONS. 

## 2014-09-05 NOTE — Telephone Encounter (Signed)
Pt is scheduled for repeat EGD on 09/26/2014.  Spoke with her and all medications were reviewed. H&P is up to date.

## 2014-09-07 NOTE — Telephone Encounter (Signed)
Please call pt. HER stomach Bx shows gastritis.    CONTINUE A SOFT MECHANICAL DIET. MEATS SHOULD BE BAKED, BROILED, OR BOILED.  MEATS SHOULD BE CHOPPED OR GROUND ONLY. DO NOT EAT CHUNKS OF ANYTHING.  START OMEPRAZOLE.  TAKE 30 MINUTES PRIOR TO YOUR MEALS TWICE DAILY.  HOLD IBUPROFEN FOR 7 DAYS.  FOLLOW UP FOR REPEAT UPPER ENDOSCOPY.  FOLLOW UP IN JUN 2016 E30 DYSPHAGIA/CONSTIPATION.

## 2014-09-09 NOTE — Telephone Encounter (Signed)
Called home. Many rings and no answer. Mailing a letter to call.

## 2014-09-16 ENCOUNTER — Other Ambulatory Visit: Payer: Self-pay

## 2014-09-16 NOTE — Telephone Encounter (Signed)
Pt called and was informed. Said she is scheduled for the repeat EGD on 09/26/2014.

## 2014-09-26 ENCOUNTER — Ambulatory Visit (HOSPITAL_COMMUNITY)
Admission: RE | Admit: 2014-09-26 | Discharge: 2014-09-26 | Disposition: A | Discharge status: Home / Self Care | Payer: BLUE CROSS/BLUE SHIELD | Source: Ambulatory Visit | Attending: Gastroenterology | Admitting: Gastroenterology

## 2014-09-26 ENCOUNTER — Encounter (HOSPITAL_COMMUNITY): Payer: Self-pay | Admitting: *Deleted

## 2014-09-26 ENCOUNTER — Encounter (HOSPITAL_COMMUNITY)
Admission: RE | Disposition: A | Discharge status: Home / Self Care | Payer: Self-pay | Source: Ambulatory Visit | Attending: Gastroenterology

## 2014-09-26 DIAGNOSIS — K219 Gastro-esophageal reflux disease without esophagitis: Secondary | ICD-10-CM | POA: Insufficient documentation

## 2014-09-26 DIAGNOSIS — K2950 Unspecified chronic gastritis without bleeding: Secondary | ICD-10-CM | POA: Diagnosis not present

## 2014-09-26 DIAGNOSIS — K2970 Gastritis, unspecified, without bleeding: Secondary | ICD-10-CM | POA: Diagnosis not present

## 2014-09-26 DIAGNOSIS — R1310 Dysphagia, unspecified: Secondary | ICD-10-CM | POA: Diagnosis present

## 2014-09-26 DIAGNOSIS — K2270 Barrett's esophagus without dysplasia: Secondary | ICD-10-CM | POA: Diagnosis not present

## 2014-09-26 DIAGNOSIS — R1314 Dysphagia, pharyngoesophageal phase: Secondary | ICD-10-CM

## 2014-09-26 DIAGNOSIS — M1990 Unspecified osteoarthritis, unspecified site: Secondary | ICD-10-CM | POA: Diagnosis not present

## 2014-09-26 DIAGNOSIS — K222 Esophageal obstruction: Secondary | ICD-10-CM | POA: Insufficient documentation

## 2014-09-26 HISTORY — DX: Gastro-esophageal reflux disease without esophagitis: K21.9

## 2014-09-26 HISTORY — PX: ESOPHAGOGASTRODUODENOSCOPY: SHX5428

## 2014-09-26 SURGERY — EGD (ESOPHAGOGASTRODUODENOSCOPY)
Anesthesia: Moderate Sedation

## 2014-09-26 MED ORDER — STERILE WATER FOR IRRIGATION IR SOLN
Status: DC | PRN
  Administered 2014-09-26: 09:00:00

## 2014-09-26 MED ORDER — MIDAZOLAM HCL 5 MG/5ML IJ SOLN
INTRAMUSCULAR | Status: AC
  Filled 2014-09-26: qty 10

## 2014-09-26 MED ORDER — MEPERIDINE HCL 100 MG/ML IJ SOLN
INTRAMUSCULAR | Status: DC | PRN
  Administered 2014-09-26: 50 mg
  Administered 2014-09-26: 25 mg

## 2014-09-26 MED ORDER — MEPERIDINE HCL 100 MG/ML IJ SOLN
INTRAMUSCULAR | Status: AC
  Filled 2014-09-26: qty 2

## 2014-09-26 MED ORDER — SODIUM CHLORIDE 0.9 % IV SOLN
INTRAVENOUS | Status: DC
  Administered 2014-09-26: 08:00:00 via INTRAVENOUS

## 2014-09-26 MED ORDER — LIDOCAINE VISCOUS 2 % MT SOLN
OROMUCOSAL | Status: AC
  Filled 2014-09-26: qty 15

## 2014-09-26 MED ORDER — LIDOCAINE VISCOUS 2 % MT SOLN
OROMUCOSAL | Status: DC | PRN
  Administered 2014-09-26: 1 via OROMUCOSAL

## 2014-09-26 MED ORDER — MIDAZOLAM HCL 5 MG/5ML IJ SOLN
INTRAMUSCULAR | Status: DC | PRN
  Administered 2014-09-26: 1 mg via INTRAVENOUS
  Administered 2014-09-26 (×2): 2 mg via INTRAVENOUS

## 2014-09-26 NOTE — H&P (Signed)
Primary Care Physician:  Catalina Pizza, MD Primary Gastroenterologist:  Dr. Darrick Penna  Pre-Procedure History & Physical: HPI:  Sandra Davies is a 54 y.o. female here for DYSPHAGIA.   Past Medical History  Diagnosis Date  . Dysphagia 2015  . Constipation - functional     FOR A LONG TIME  . Arthritis   . GERD (gastroesophageal reflux disease)     Past Surgical History  Procedure Laterality Date  . Knee surgery    . Esophagogastroduodenoscopy N/A 09/03/2014    Procedure: ESOPHAGOGASTRODUODENOSCOPY (EGD);  Surgeon: West Bali, MD;  Location: [facility];  Service: Endoscopy;  Laterality: N/A;  230   . Esophageal dilation N/A 09/03/2014    Procedure: ESOPHAGEAL DILATION;  Surgeon: West Bali, MD;  Location: [facility];  Service: Endoscopy;  Laterality: N/A;    Prior to Admission medications   Medication Sig Start Date End Date Taking? Authorizing Provider  cyclobenzaprine (FLEXERIL) 10 MG tablet Take 10 mg by mouth daily.   Yes Historical Provider, MD  diclofenac (VOLTAREN) 75 MG EC tablet Take 75 mg by mouth 2 (two) times daily.   Yes Historical Provider, MD  Guaifenesin 1200 MG TB12 Take 1 tablet (1,200 mg total) by mouth 2 (two) times daily. 08/02/13  Yes Christopher Lawyer, PA-C  HYDROcodone-acetaminophen (NORCO) 7.5-325 MG per tablet Take 1 tablet by mouth every 6 (six) hours as needed for moderate pain (as needed).   Yes Historical Provider, MD  omeprazole (PRILOSEC) 20 MG capsule 1 PO 30 mins prior to breakfast and supper 09/03/14  Yes West Bali, MD  diphenhydrAMINE (BENADRYL) 25 MG tablet Take 25 mg by mouth every 6 (six) hours as needed for allergies.    Historical Provider, MD  promethazine-dextromethorphan (PROMETHAZINE-DM) 6.25-15 MG/5ML syrup Take 5 mLs by mouth 4 (four) times daily as needed for cough. Patient not taking: Reported on 08/21/2014 08/02/13   Charlestine Night, PA-C    Allergies as of 09/05/2014  . (No Known Allergies)    Family History   Problem Relation Age of Onset  . Breast cancer Mother     DECEASED  . Colon polyps Neg Hx   . Colon cancer Neg Hx   . Stomach cancer Neg Hx     History   Social History  . Marital Status: Single    Spouse Name: N/A  . Number of Children: N/A  . Years of Education: N/A   Occupational History  . Not on file.   Social History Main Topics  . Smoking status: Never Smoker   . Smokeless tobacco: Not on file  . Alcohol Use: 0.0 oz/week    0 Standard drinks or equivalent per week     Comment: very occasional  . Drug Use: No  . Sexual Activity: Not on file   Other Topics Concern  . Not on file   Social History Narrative    Review of Systems: See HPI, otherwise negative ROS   Physical Exam: BP 131/97 mmHg  Pulse 76  Temp(Src) 97.7 F (36.5 C) (Oral)  Resp 14  Ht  (1.702 m)  Wt 162 lb (73.483 kg)  BMI 25.37 kg/m2  SpO2 100%  LMP 07/02/2013 General:   Alert,  pleasant and cooperative in NAD Head:  Normocephalic and atraumatic. Neck:  Supple; Lungs:  Clear throughout to auscultation.    Heart:  Regular rate and rhythm. Abdomen:  Soft, nontender and nondistended. Normal bowel sounds, without guarding, and without rebound.   Neurologic:  Alert  and  oriented x4;  grossly normal neurologically.  Impression/Plan:    DYSPHAGIA  PLAN:  EGD/DIL TODAY

## 2014-09-26 NOTE — Discharge Instructions (Signed)
I dilated your esophagus. You have a stricture near the base of your esophagus.  You have mild gastritis. I biopsied your esophagus.   CONTINUE OMEPRAZOLE.  TAKE 30 MINUTES PRIOR TO YOUR MEALS TWICE DAILY.  FOLLOW A LOW FAT DIET.  YOUR BIOPSY RESULTS WILL BE AVAILABLE IN MY CHART AFTER MAR 29 OR MY OFFICE WILL CONTACT YOU IN 10-14 DAYS WITH YOUR RESULTS.   FOLLOW UP IN JUN 2016.  UPPER ENDOSCOPY AFTER CARE Read the instructions outlined below and refer to this sheet in the next week. These discharge instructions provide you with general information on caring for yourself after you leave the hospital. While your treatment has been planned according to the most current medical practices available, unavoidable complications occasionally occur. If you have any problems or questions after discharge, call DR. Garren Greenman, [phone].  ACTIVITY  You may resume your regular activity, but move at a slower pace for the next 24 hours.   Take frequent rest periods for the next 24 hours.   Walking will help get rid of the air and reduce the bloated feeling in your belly (abdomen).   No driving for 24 hours (because of the medicine (anesthesia) used during the test).   You may shower.   Do not sign any important legal documents or operate any machinery for 24 hours (because of the anesthesia used during the test).    NUTRITION  Drink plenty of fluids.   You may resume your normal diet as instructed by your doctor.   Begin with a light meal and progress to your normal diet. Heavy or fried foods are harder to digest and may make you feel sick to your stomach (nauseated).   Avoid alcoholic beverages for 24 hours or as instructed.    MEDICATIONS  You may resume your normal medications.   WHAT YOU CAN EXPECT TODAY  Some feelings of bloating in the abdomen.   Passage of more gas than usual.    IF YOU HAD A BIOPSY TAKEN DURING THE UPPER ENDOSCOPY:  Eat a soft diet IF YOU HAVE NAUSEA,  BLOATING, ABDOMINAL PAIN, OR VOMITING.    FINDING OUT THE RESULTS OF YOUR TEST Not all test results are available during your visit. DR. Darrick Penna WILL CALL YOU WITHIN 14 DAYS OF YOUR PROCEDUE WITH YOUR RESULTS. Do not assume everything is normal if you have not heard from DR. Nymir Ringler, CALL HER OFFICE AT [phone].  SEEK IMMEDIATE MEDICAL ATTENTION AND CALL THE OFFICE: [phone] IF:  You have more than a spotting of blood in your stool.   Your belly is swollen (abdominal distention).   You are nauseated or vomiting.   You have a temperature over 101F.   You have abdominal pain or discomfort that is severe or gets worse throughout the day.  Gastritis  Gastritis is an inflammation (the body's way of reacting to injury and/or infection) of the stomach. It is often caused by viral or bacterial (germ) infections. It can also be caused BY ASPIRIN, BC/GOODY POWDER'S, (IBUPROFEN) MOTRIN, OR ALEVE (NAPROXEN), chemicals (including alcohol), SPICY FOODS, and medications. This illness may be associated with generalized malaise (feeling tired, not well), UPPER ABDOMINAL STOMACH cramps, and fever. One common bacterial cause of gastritis is an organism known as H. Pylori. This can be treated with antibiotics.   ESOPHAGEAL STRICTURE  Esophageal strictures can be caused by stomach acid backing up into the tube that carries food from the mouth down to the stomach (lower esophagus).  TREATMENT There are a  number of medicines used to treat reflux/stricture, including: Antacids.  Proton-pump inhibitors: OMEPRAZOLE  HOME CARE INSTRUCTIONS Eat 2-3 hours before going to bed.  Try to reach and maintain a healthy weight.  Do not eat just a few very large meals. Instead, eat 4 TO 6 smaller meals throughout the day.  Try to identify foods and beverages that make your symptoms worse, and avoid these.  Avoid tight clothing.  Do not exercise right after eating.

## 2014-09-26 NOTE — Op Note (Signed)
[facility] [address] [place], [id number]   ENDOSCOPY PROCEDURE REPORT  PATIENT: Sandra Davies, [name]  MR#: [id number] BIRTHDATE: [birth year] , 53  yrs. old GENDER: female  ENDOSCOPIST: West BaliSandi L Kaydin Labo, MD REFFERED AO:ZHYQBY:Zach Hall, M.D.  PROCEDURE DATE:  09/26/2014 PROCEDURE:   EGD with biopsy and EGD with dilatation over guidewire   INDICATIONS:1.  dysphagia. MEDICATIONS: Demerol 75 mg IV and Versed 5 mg IV TOPICAL ANESTHETIC: Viscous Xylocaine  DESCRIPTION OF PROCEDURE:   After the risks benefits and alternatives of the procedure were thoroughly explained, informed consent was obtained.  The [id number] ([id number]  endoscope was introduced through the mouth and advanced to the second portion of the duodenum. The instrument was slowly withdrawn as the mucosa was carefully examined.  Prior to withdrawal of the scope, the guidwire was placed.  The esophagus was dilated successfully.  The patient was recovered in endoscopy and discharged home in satisfactory condition.   ESOPHAGUS: A stricture was found at the gastroesophageal junction. The stenosis was traversable with the endoscope.  Multiple biopsies were performed using cold forceps.  Sample obtained for evaluation of eosinophilic esophagitis.   STOMACH: Mild non-erosive gastritis (inflammation) was found in the gastric antrum.  Multiple biopsies were performed using cold forceps.   DUODENUM: The duodenal mucosa showed no abnormalities in the bulb and second portion of the duodenum.   Dilation was then performed at the gastroesphageal junction Dilator: Savary over guidewire Size(s): 14-16 mm Resistance: moderate Heme: yes  COMPLICATIONS: There were no immediate complications.  ENDOSCOPIC IMPRESSION: 1.   Stricture at the gastroesophageal junction 2.   MILD Non-erosive gastritis  RECOMMENDATIONS: START OMEPRAZOLE.  TAKE 30 MINUTES PRIOR TO BREAKFAST AND SUPPER FOR 3 MOS THEN ONCE DAILY . AVOID ITEMS THAT  TRIGGER GASTRITIS. FOLLOW A HIGH FIBER/LOW FAT DIET.  AVOID ITEMS THAT CAUSE BLOATING.  AWAIT BIOPSY. REPEAT EGD IN 3 MOS. Next colonoscopy in 3-5 years.   eSigned:  West BaliSandi L Manette Doto, MD 09/26/2014 11:35 AM   CPT CODES: ICD CODES:  The ICD and CPT codes recommended by this software are interpretations from the data that the clinical staff has captured with the software.  The verification of the translation of this report to the ICD and CPT codes and modifiers is the sole responsibility of the health care institution and practicing physician where this report was generated.  [employer] will not be held responsible for the validity of the ICD and CPT codes included on this report.  AMA assumes no liability for data contained or not contained herein. CPT is a Publishing rights managerregistered trademark of the [employer].

## 2014-09-30 ENCOUNTER — Encounter (HOSPITAL_COMMUNITY): Payer: Self-pay | Admitting: Gastroenterology

## 2014-10-15 ENCOUNTER — Telehealth: Payer: Self-pay | Admitting: Gastroenterology

## 2014-10-15 NOTE — Telephone Encounter (Signed)
Tried to call pt. Many rings and no answer. Mailing a letter to call.  

## 2014-10-15 NOTE — Telephone Encounter (Signed)
Please call pt. HER ESOPHAGUS BIOPSIES show AN INFLAMMED ESOPHAGUS FROM ACID REFULX. Marland Kitchen.    CONTINUE OMEPRAZOLE.  TAKE 30 MINUTES PRIOR TO YOUR MEALS TWICE DAILY FOR 3 MS THEN ONCE DAILY FOREVER.  FOLLOW A LOW FAT DIET AND GERD LIFESTYLE RECOMMENDATIONS.  FOLLOW UP IN JUN 2016 E30 DYSPHAGIA/GERD.    Lifestyle and home remedies You may eliminate or reduce the frequency of heartburn by making the following lifestyle changes:  . Control your weight. Being overweight is a major risk factor for heartburn and GERD. Excess pounds put pressure on your abdomen, pushing up your stomach and causing acid to back up into your esophagus.   . Eat smaller meals. 4 TO 6 MEALS A DAY. This reduces pressure on the lower esophageal sphincter, helping to prevent the valve from opening and acid from washing back into your esophagus.   Allena Earing. Loosen your belt. Clothes that fit tightly around your waist put pressure on your abdomen and the lower esophageal sphincter.  . Eliminate heartburn triggers. Everyone has specific triggers.  .  Common triggers such as fatty or fried foods, spicy food, tomato sauce, carbonated beverages, alcohol, chocolate, mint, garlic, onion, caffeine and nicotine may make heartburn worse.   Marland Kitchen. Avoid stooping or bending. Tying your shoes is OK. Bending over for longer periods to weed your garden isn't, especially soon after eating.   . Don't lie down after a meal. Wait at least three to four hours after eating before going to bed, and don't lie down right after eating.

## 2014-10-16 NOTE — Telephone Encounter (Signed)
PATIENT HAS APPOINTMENT  °

## 2014-12-10 ENCOUNTER — Ambulatory Visit: Payer: BLUE CROSS/BLUE SHIELD | Admitting: Nurse Practitioner

## 2014-12-16 ENCOUNTER — Ambulatory Visit: Payer: BLUE CROSS/BLUE SHIELD | Admitting: Nurse Practitioner

## 2015-01-07 ENCOUNTER — Ambulatory Visit: Payer: BLUE CROSS/BLUE SHIELD | Admitting: Gastroenterology

## 2015-04-09 ENCOUNTER — Ambulatory Visit (INDEPENDENT_AMBULATORY_CARE_PROVIDER_SITE_OTHER): Payer: Federal, State, Local not specified - PPO | Admitting: Obstetrics and Gynecology

## 2015-04-09 ENCOUNTER — Encounter: Payer: Self-pay | Admitting: Obstetrics and Gynecology

## 2015-04-09 VITALS — BP 130/92 | Ht 67.5 in | Wt 162.0 lb

## 2015-04-09 DIAGNOSIS — N951 Menopausal and female climacteric states: Secondary | ICD-10-CM | POA: Diagnosis not present

## 2015-04-09 MED ORDER — ESTRADIOL 0.1 MG/24HR TD PTTW: 1.0000 | MEDICATED_PATCH | TRANSDERMAL | Status: DC

## 2015-04-09 MED ORDER — PROGESTERONE MICRONIZED 200 MG PO CAPS: 200.0000 mg | ORAL_CAPSULE | Freq: Every day | ORAL | Status: DC

## 2015-04-09 NOTE — Progress Notes (Signed)
Patient ID: Sandra Davies, female   DOB: 14-Feb-1961, 54 y.o.   MRN: [id number] Pt here today for menopause issues. Pt states that she is very tired, has no desire for intercourse, hot flashes. Pt states that the symptoms started over the summer.

## 2015-04-09 NOTE — Progress Notes (Signed)
Patient ID: Sandra Davies, female   DOB: Jun 05, 1961, 55 y.o.   MRN: [id number]    [facility] Visit  Patient name: Sandra Davies MRN [id number]  Date of birth: Mar 05, 1961  CC & HPI:  Sandra Davies is a 54 y.o. female presenting today for menopause issues.decreased libido, vag dryness, insomnia, and severe hot flashes. Pt works at [employer], where workspace is very very hot and has been sanctioned for conditions.  ROS:  Lengthy discussion of pt concerns about coworkers, social issues in community, and potential for change.  Pertinent History Reviewed:   Reviewed: Significant for  Medical         Past Medical History  Diagnosis Date  . Dysphagia 2015  . Constipation - functional     FOR A LONG TIME  . Arthritis   . GERD (gastroesophageal reflux disease)                               Surgical Hx:    Past Surgical History  Procedure Laterality Date  . Knee surgery    . Esophagogastroduodenoscopy N/A 09/03/2014    YQM:VHQIONGE gastritis/dysphagia due to distal esophagel stricture  . Esophageal dilation N/A 09/03/2014    Procedure: ESOPHAGEAL DILATION;  Surgeon: West Bali, MD;  [facility];  Service: Endoscopy;  Laterality: N/A;  . Esophagogastroduodenoscopy N/A 09/26/2014    XBM:WUXL non-erosive gastritis/stricture at the gastroesphageal   Medications: Reviewed & Updated - see associated section                       Current outpatient prescriptions:  .  cyclobenzaprine (FLEXERIL) 10 MG tablet, Take 10 mg by mouth daily., Disp: , Rfl:  .  diclofenac (VOLTAREN) 75 MG EC tablet, Take 75 mg by mouth 2 (two) times daily., Disp: , Rfl:  .  diphenhydrAMINE (BENADRYL) 25 MG tablet, Take 25 mg by mouth every 6 (six) hours as needed for allergies., Disp: , Rfl:  .  HYDROcodone-acetaminophen (NORCO) 7.5-325 MG per tablet, Take 1 tablet by mouth every 6 (six) hours as needed for moderate pain (as needed)., Disp: , Rfl:  .  omeprazole (PRILOSEC) 20 MG capsule, 1  PO 30 mins prior to breakfast and supper, Disp: 60 capsule, Rfl: 11   Social History: Reviewed -  reports that she has never smoked. She has never used smokeless tobacco.  Objective Findings:  Vitals: Blood pressure 130/92, height 5' 7.5" (1.715 m), weight 162 lb (73.483 kg), last menstrual period 07/02/2013.  Physical Examination: General appearance - alert, well appearing, and in no distress, oriented to person, place, and time and normal appearing weight Mental status - alert, oriented to person, place, and time, normal mood, behavior, speech, dress, motor activity, and thought processes .time spent 30 mims  Assessment & Plan:  A: menopause sx.  P: 1. Trial Vivelle dot. 2 prometrium 200 x 14d/month  1. rechk 2 mos

## 2015-06-11 ENCOUNTER — Ambulatory Visit: Payer: Federal, State, Local not specified - PPO | Admitting: Obstetrics and Gynecology

## 2015-07-03 ENCOUNTER — Ambulatory Visit: Payer: Federal, State, Local not specified - PPO | Admitting: Obstetrics and Gynecology

## 2015-07-14 ENCOUNTER — Ambulatory Visit: Payer: Federal, State, Local not specified - PPO | Admitting: Obstetrics and Gynecology

## 2015-07-23 ENCOUNTER — Ambulatory Visit: Payer: Federal, State, Local not specified - PPO | Admitting: Obstetrics and Gynecology

## 2015-08-05 ENCOUNTER — Other Ambulatory Visit: Payer: Self-pay | Admitting: Orthopaedic Surgery

## 2015-08-05 DIAGNOSIS — M545 Low back pain: Secondary | ICD-10-CM

## 2015-08-12 ENCOUNTER — Encounter: Payer: Self-pay | Admitting: Orthopaedic Surgery

## 2015-08-20 ENCOUNTER — Ambulatory Visit (HOSPITAL_COMMUNITY)
Admission: RE | Admit: 2015-08-20 | Discharge: 2015-08-20 | Disposition: A | Discharge status: Home / Self Care | Payer: BLUE CROSS/BLUE SHIELD | Source: Ambulatory Visit | Attending: Orthopaedic Surgery | Admitting: Orthopaedic Surgery

## 2015-08-20 DIAGNOSIS — M545 Low back pain: Secondary | ICD-10-CM | POA: Insufficient documentation

## 2015-08-20 DIAGNOSIS — M899 Disorder of bone, unspecified: Secondary | ICD-10-CM | POA: Diagnosis not present

## 2015-08-20 DIAGNOSIS — M47816 Spondylosis without myelopathy or radiculopathy, lumbar region: Secondary | ICD-10-CM | POA: Insufficient documentation

## 2015-08-21 ENCOUNTER — Encounter: Payer: Self-pay | Admitting: Orthopaedic Surgery

## 2015-08-21 ENCOUNTER — Telehealth: Payer: Self-pay | Admitting: Obstetrics and Gynecology

## 2015-08-21 ENCOUNTER — Ambulatory Visit: Payer: Federal, State, Local not specified - PPO | Admitting: Obstetrics and Gynecology

## 2015-08-21 ENCOUNTER — Encounter: Payer: Self-pay | Admitting: Obstetrics and Gynecology

## 2015-08-21 ENCOUNTER — Ambulatory Visit (INDEPENDENT_AMBULATORY_CARE_PROVIDER_SITE_OTHER): Payer: BLUE CROSS/BLUE SHIELD | Admitting: Orthopaedic Surgery

## 2015-08-21 VITALS — BP 152/94 | HR 92 | Temp 97.3°F | Ht 67.5 in | Wt 161.6 lb

## 2015-08-21 DIAGNOSIS — M545 Low back pain, unspecified: Secondary | ICD-10-CM

## 2015-08-21 MED ORDER — DICLOFENAC SODIUM 75 MG PO TBEC: 75.0000 mg | DELAYED_RELEASE_TABLET | Freq: Two times a day (BID) | ORAL | Status: DC

## 2015-08-21 NOTE — Progress Notes (Signed)
Patient ZO:[id number] Sandra Davies, female DOB:05-23-1961, 55 y.o. WJX:[id number]  Chief Complaint  Patient presents with  . Follow-up    Back pain and MRI results    HPI  Sandra Davies is a 55 y.o. female who has been having lower back pain.  She had a MRI on 08/20/15.  The findings were:   Transitional lumbosacral anatomy as described above. Recommend correlation with plain films if any intervention is planned.  No evidence of posttraumatic change is identified.  Mild progression of spondylosis without central canal narrowing. Extra foraminal disc on the right at L4-5 contacts but does not compress or displace the the right L4 root. There has also been mild progression of facet degenerative disease on the left at L3-4.  No change in a T2 hyperintense lesion on the right T12-L1 since 2004 which could be a prominent perineural cyst or pseudomeningocele.  I have explained the findings to her.  I have reviewed the films.  She asked appropriate questions.  She will continue her exercises.  She will remain out of work. HPI  Body mass index is 24.92 kg/(m^2).  Review of Systems   Review of Systems  Gastrointestinal: Positive for constipation.       GERD  Musculoskeletal: Positive for back pain and joint swelling (right knee).  All other systems reviewed and are negative.   Past Medical History  Diagnosis Date  . Dysphagia 2015  . Constipation - functional     FOR A LONG TIME  . Arthritis   . GERD (gastroesophageal reflux disease)     Past Surgical History  Procedure Laterality Date  . Knee surgery    . Esophagogastroduodenoscopy N/A 09/03/2014    OZH:[id number] gastritis/dysphagia due to distal esophagel stricture  . Esophageal dilation N/A 09/03/2014    Procedure: ESOPHAGEAL DILATION;  Surgeon: West Bali, MD;  [facility];  Service: Endoscopy;  Laterality: N/A;  . Esophagogastroduodenoscopy N/A 09/26/2014    NGE:XBMW non-erosive gastritis/stricture at the  gastroesphageal    Family History  Problem Relation Age of Onset  . Breast cancer Mother     DECEASED  . Colon polyps Neg Hx   . Colon cancer Neg Hx   . Stomach cancer Neg Hx   . Alzheimer's disease Father   . Heart disease Brother   . Lupus Sister     Social History Social History  Substance Use Topics  . Smoking status: Never Smoker   . Smokeless tobacco: Never Used  . Alcohol Use: 0.0 oz/week    0 Standard drinks or equivalent per week     Comment: very occasional    No Known Allergies  Current Outpatient Prescriptions  Medication Sig Dispense Refill  . cyclobenzaprine (FLEXERIL) 10 MG tablet Take 10 mg by mouth daily.    Marland Kitchen estradiol (ALORA) 0.1 MG/24HR patch Place 1 patch (0.1 mg total) onto the skin 2 (two) times a week. 8 patch 12  . HYDROcodone-acetaminophen (NORCO) 7.5-325 MG per tablet Take 1 tablet by mouth every 6 (six) hours as needed for moderate pain (as needed).    Marland Kitchen omeprazole (PRILOSEC) 20 MG capsule 1 PO 30 mins prior to breakfast and supper 60 capsule 11  . progesterone (PROMETRIUM) 200 MG capsule Take 1 capsule (200 mg total) by mouth daily. Take x 14 days each month (first 14 days) 42 capsule 3  . diclofenac (VOLTAREN) 75 MG EC tablet Take 1 tablet (75 mg total) by mouth 2 (two) times daily with a meal. 60 tablet  2  . diphenhydrAMINE (BENADRYL) 25 MG tablet Take 25 mg by mouth every 6 (six) hours as needed for allergies. Reported on 08/21/2015     No current facility-administered medications for this visit.     Physical Exam  Blood pressure 152/94, pulse 92, temperature 97.3 F (36.3 C), height 5' 7.5" (1.715 m), weight 161 lb 9.6 oz (73.301 kg), last menstrual period 07/02/2013.  Constitutional: overall normal hygiene, normal nutrition, well developed, normal grooming, normal body habitus. Assistive device:none  Musculoskeletal: gait and station Limp none, muscle tone and strength are normal, no tremors or atrophy is present.  .  Neurological:  coordination overall normal.  Deep tendon reflex/nerve stretch intact.  Sensation normal.  Cranial nerves II-XII intact.   Skin:   normal overall no scars, lesions, ulcers or rashes. No psoriasis.  Psychiatric: Alert and oriented x 3.  Recent memory intact, remote memory unclear.  Normal mood and affect. Well groomed.  Good eye contact.  Cardiovascular: overall no swelling, no varicosities, no edema bilaterally, normal temperatures of the legs and arms, no clubbing, cyanosis and good capillary refill.  Lymphatic: palpation is normal.  Spine/Pelvis examination:  Inspection:  Overall, sacoiliac joint benign and hips nontender; without crepitus or defects.   Thoracic spine inspection: Alignment normal without kyphosis present   Lumbar spine inspection:  Alignment  with normal lumbar lordosis, without scoliosis apparent.   Thoracic spine palpation:  without tenderness of spinal processes   Lumbar spine palpation: with tenderness of lumbar area; without tightness of lumbar muscles    Range of Motion:   Lumbar flexion, forward flexion is 30  with pain or tenderness    Lumbar extension is 5  with pain or tenderness   Left lateral bend is Normal  without pain or tenderness   Right lateral bend is Normal without pain or tenderness   Straight leg raising is Normal   Strength & tone: Normal   Stability overall normal stability   The patient has been educated about the nature of the problem(s) and counseled on treatment options.  The patient appeared to understand what I have discussed and is in agreement with it.  PLAN Call if any problems.  Precautions discussed.  Continue current medications.   Return to clinic 2 wks Remain out of work

## 2015-08-21 NOTE — Telephone Encounter (Signed)
Husband reports that pt had an appt he thinks with Dr Hilda Lias at 2 pm today, and that she left 20 mins ago for that meeting. Pt to call our office and reschedule. Pt would be a good source of info as we try to sort out how to improve appt system.

## 2015-08-23 ENCOUNTER — Other Ambulatory Visit: Payer: Self-pay | Admitting: Gastroenterology

## 2015-09-01 ENCOUNTER — Telehealth: Payer: Self-pay | Admitting: Orthopaedic Surgery

## 2015-09-01 MED ORDER — HYDROCODONE-ACETAMINOPHEN 7.5-325 MG PO TABS: 1.0000 | ORAL_TABLET | ORAL | Status: DC | PRN

## 2015-09-01 NOTE — Telephone Encounter (Signed)
Rx printed

## 2015-09-01 NOTE — Telephone Encounter (Signed)
Patient is requesting Norco 7.5-325 mgs.  Qty 120   This was last filled on 07-28-15

## 2015-09-04 ENCOUNTER — Encounter: Payer: Self-pay | Admitting: Orthopaedic Surgery

## 2015-09-04 ENCOUNTER — Ambulatory Visit (INDEPENDENT_AMBULATORY_CARE_PROVIDER_SITE_OTHER): Payer: BLUE CROSS/BLUE SHIELD | Admitting: Orthopaedic Surgery

## 2015-09-04 VITALS — BP 130/91 | HR 110 | Temp 97.3°F | Resp 16 | Ht 67.5 in | Wt 161.0 lb

## 2015-09-04 DIAGNOSIS — M545 Low back pain, unspecified: Secondary | ICD-10-CM

## 2015-09-04 MED ORDER — HYDROCODONE-ACETAMINOPHEN 7.5-325 MG PO TABS: 1.0000 | ORAL_TABLET | ORAL | Status: DC | PRN

## 2015-09-04 NOTE — Progress Notes (Addendum)
Patient Sandra Davies Sandra Davies, female DOB:Jan 23, 1961, 55 y.o. EXB:[id number]  Chief Complaint  Patient presents with  . Follow-up    follow up back pain    HPI  Sandra Davies is a 55 y.o. female who has lower back pain that is not improving.  She had MRI which did not show HNP.  She has been doing exercises at home that are not helping.  Her pain medicine helps some but she still has lower back pain and no radiation.  She has no bowel or bladder problem.  She has tried ice, heat, rest and still has pain.  I will begin PT.  HPI  Body mass index is 24.83 kg/(m^2).   Review of Systems  Constitutional:       Patient does not have Diabetes Mellitus. Patient does not have hypertension. Patient does not have COPD or shortness of breath. Patient does not have BMI > 35. Patient does not have current smoking history.   HENT: Negative for congestion.   Respiratory: Negative for cough and shortness of breath.   Cardiovascular: Negative for chest pain.  Gastrointestinal: Positive for constipation.       GERD  Endocrine: Positive for cold intolerance.  Musculoskeletal: Positive for back pain and joint swelling (right knee).  Allergic/Immunologic: Negative for environmental allergies.  All other systems reviewed and are negative.   Past Medical History  Diagnosis Date  . Dysphagia 2015  . Constipation - functional     FOR A LONG TIME  . Arthritis   . GERD (gastroesophageal reflux disease)     Past Surgical History  Procedure Laterality Date  . Knee surgery    . Esophagogastroduodenoscopy N/A 09/03/2014    NUU:[id number] gastritis/dysphagia due to distal esophagel stricture  . Esophageal dilation N/A 09/03/2014    Procedure: ESOPHAGEAL DILATION;  Surgeon: West Bali, MD;  [facility];  Service: Endoscopy;  Laterality: N/A;  . Esophagogastroduodenoscopy N/A 09/26/2014    IHK:VQQV non-erosive gastritis/stricture at the gastroesphageal    Family History  Problem  Relation Age of Onset  . Breast cancer Mother     DECEASED  . Colon polyps Neg Hx   . Colon cancer Neg Hx   . Stomach cancer Neg Hx   . Alzheimer's disease Father   . Heart disease Brother   . Lupus Sister     Social History Social History  Substance Use Topics  . Smoking status: Never Smoker   . Smokeless tobacco: Never Used  . Alcohol Use: 0.0 oz/week    0 Standard drinks or equivalent per week     Comment: very occasional    No Known Allergies  Current Outpatient Prescriptions  Medication Sig Dispense Refill  . cyclobenzaprine (FLEXERIL) 10 MG tablet Take 10 mg by mouth daily.    . diclofenac (VOLTAREN) 75 MG EC tablet Take 1 tablet (75 mg total) by mouth 2 (two) times daily with a meal. 60 tablet 2  . diphenhydrAMINE (BENADRYL) 25 MG tablet Take 25 mg by mouth every 6 (six) hours as needed for allergies. Reported on 08/21/2015    . estradiol (ALORA) 0.1 MG/24HR patch Place 1 patch (0.1 mg total) onto the skin 2 (two) times a week. 8 patch 12  . HYDROcodone-acetaminophen (NORCO) 7.5-325 MG tablet Take 1 tablet by mouth every 4 (four) hours as needed for moderate pain (Must last 30 days.  Do not drive or operate machinery while taking this medicine.). 120 tablet 0  . omeprazole (PRILOSEC) 20 MG capsule TAKE  1 CAPSULE BY MOUTH 30 MINUTES PRIOR TO BREAKFAST AND SUPPER 60 capsule 11  . progesterone (PROMETRIUM) 200 MG capsule Take 1 capsule (200 mg total) by mouth daily. Take x 14 days each month (first 14 days) 42 capsule 3   No current facility-administered medications for this visit.     Physical Exam  Blood pressure 130/91, pulse 110, temperature 97.3 F (36.3 C), resp. rate 16, height 5' 7.5" (1.715 m), weight 161 lb (73.029 kg), last menstrual period 07/02/2013.  Constitutional: overall normal hygiene, normal nutrition, well developed, normal grooming, normal body habitus. Assistive device:none  Musculoskeletal: gait and station Limp none, muscle tone and strength are  normal, no tremors or atrophy is present.  .  Neurological: coordination overall normal.  Deep tendon reflex/nerve stretch intact.  Sensation normal.  Cranial nerves II-XII intact.   Skin:   normal overall no scars, lesions, ulcers or rashes. No psoriasis.  Psychiatric: Alert and oriented x 3.  Recent memory intact, remote memory unclear.  Normal mood and affect. Well groomed.  Good eye contact.  Cardiovascular: overall no swelling, no varicosities, no edema bilaterally, normal temperatures of the legs and arms, no clubbing, cyanosis and good capillary refill.  Lymphatic: palpation is normal. Spine/Pelvis examination:  Inspection:  Overall, sacoiliac joint benign and hips nontender; without crepitus or defects.   Thoracic spine inspection: Alignment normal without kyphosis present   Lumbar spine inspection:  Alignment  with normal lumbar lordosis, without scoliosis apparent.   Thoracic spine palpation:  without tenderness of spinal processes   Lumbar spine palpation: with tenderness of lumbar area; with tightness of lumbar muscles    Range of Motion:   Lumbar flexion, forward flexion is 20  with pain or tenderness    Lumbar extension is 5  with pain or tenderness   Left lateral bend is Normal  without pain or tenderness   Right lateral bend is Normal without pain or tenderness   Straight leg raising is Normal   Strength & tone: Normal   Stability overall normal stability    Additional services performed: Begin PT.  I have talked to her about her pain.  She is depressed as the pain continues and she cannot work.  I have talked about stretching exercises and the therapy.  The patient has been educated about the nature of the problem(s) and counseled on treatment options.  The patient appeared to understand what I have discussed and is in agreement with it.  Encounter Diagnosis  Name Primary?  . Midline low back pain without sciatica Yes    PLAN Call if any problems.   Precautions discussed.  Continue current medications.   Return to clinic 3 weeks

## 2015-09-04 NOTE — Patient Instructions (Addendum)
OOW Begin PT

## 2015-09-18 ENCOUNTER — Ambulatory Visit (HOSPITAL_COMMUNITY): Payer: BLUE CROSS/BLUE SHIELD | Attending: Orthopedic Surgery | Admitting: Physical Therapy

## 2015-09-18 DIAGNOSIS — R6889 Other general symptoms and signs: Secondary | ICD-10-CM | POA: Insufficient documentation

## 2015-09-18 DIAGNOSIS — R29898 Other symptoms and signs involving the musculoskeletal system: Secondary | ICD-10-CM | POA: Diagnosis present

## 2015-09-18 DIAGNOSIS — M545 Low back pain, unspecified: Secondary | ICD-10-CM

## 2015-09-18 NOTE — Therapy (Signed)
[facility] [address] [place], [place], [address] Phone: [phone]   Fax:  [phone]  Physical Therapy Evaluation  Patient Details  Name: Sandra Davies MRN: [id number] Date of Birth: 02/05/1961 Referring Provider: Darreld Mclean  Encounter Date: 09/18/2015      PT End of Session - 09/18/15 1609    Visit Number 1   Number of Visits 12   Date for PT Re-Evaluation 10/09/15   Authorization Type [employer]   PT Start Time 1520   PT Stop Time 1600   PT Time Calculation (min) 40 min   Activity Tolerance Patient tolerated treatment well   Behavior During Therapy [facility] for tasks assessed/performed      Past Medical History  Diagnosis Date  . Dysphagia 2015  . Constipation - functional     FOR A LONG TIME  . Arthritis   . GERD (gastroesophageal reflux disease)     Past Surgical History  Procedure Laterality Date  . Knee surgery    . Esophagogastroduodenoscopy N/A 09/03/2014    ONG:[id number] gastritis/dysphagia due to distal esophagel stricture  . Esophageal dilation N/A 09/03/2014    Procedure: ESOPHAGEAL DILATION;  Surgeon: West Bali, MD;  [facility];  Service: Endoscopy;  Laterality: N/A;  . Esophagogastroduodenoscopy N/A 09/26/2014    KGM:WNUU non-erosive gastritis/stricture at the gastroesphageal    There were no vitals filed for this visit.  Visit Diagnosis:  Proximal leg weakness - Plan: PT plan of care cert/re-cert  Bilateral low back pain without sciatica - Plan: PT plan of care cert/re-cert  Decreased activity tolerance - Plan: PT plan of care cert/re-cert      Subjective Assessment - 09/18/15 1523    Subjective Ms. Hiraldo states that she fell in the summer and was stiff for a couple of days but during the holidays she began to notice increased pain.   She states that her pain is mainly on the right side of her back but is bilateral.    She states that she currently has came out of work due to her back  pain.    Pertinent History Rt knee surgery    How long can you sit comfortably? Able to sit for 30 minutes    How long can you stand comfortably? able to stand for 15-20 mintues    How long can you walk comfortably? able to walk for 15 mintues   Patient Stated Goals less pain; get back to work    Currently in Pain? Yes   Pain Score 6    Pain Location Back   Pain Orientation Lower   Pain Descriptors / Indicators Aching;Tightness;Throbbing   Pain Type Chronic pain   Pain Onset More than a month ago   Pain Frequency Intermittent   Aggravating Factors  sitting down,(the act of); lying on her back    Pain Relieving Factors , ointment, baths   Multiple Pain Sites No            [facility] PT Assessment - 09/18/15 0001    Assessment   Medical Diagnosis Lumbar strain   Referring Provider Darreld Mclean   Onset Date/Surgical Date 07/23/15   Next MD Visit 09/25/2015   Prior Therapy None   Precautions   Precautions None   Restrictions   Weight Bearing Restrictions No   Balance Screen   Has the patient fallen in the past 6 months No   How many times? 1   Has the patient had a decrease  in activity level because of a fear of falling?  Yes   Is the patient reluctant to leave their home because of a fear of falling?  Yes   Home Environment   Living Environment Private residence   Home Access Level entry   Home Layout One level   Prior Function   Vocation Full time employment   Vocation Requirements Uses a handjack; pulls boxes out, on concrete    Observation/Other Assessments   Focus on Therapeutic Outcomes (FOTO)  36   Posture/Postural Control   Posture/Postural Control No significant limitations   ROM / Strength   AROM / PROM / Strength AROM;Strength   AROM   AROM Assessment Site Lumbar   Lumbar Flexion 90 with increased pain pulling herself back up    Lumbar Extension 22 reps no change    Strength   Overall Strength Comments core appears to be 3/5    Strength Assessment Site  Hip;Knee;Ankle   Right/Left Hip Right;Left   Right Hip Flexion 4/5   Right Hip Extension 4/5   Right Hip ABduction 5/5   Left Hip Flexion 4/5   Left Hip Extension 4+/5   Left Hip ABduction 5/5   Right/Left Knee Right;Left   Right Knee Flexion 4/5   Right Knee Extension 5/5   Left Knee Flexion 5/5   Left Knee Extension 5/5   Right/Left Ankle Right;Left   Right Ankle Dorsiflexion 5/5   Left Ankle Dorsiflexion 5/5   Flexibility   Soft Tissue Assessment /Muscle Length yes   Hamstrings LT 155; Rt  145                   [facility] Adult PT Treatment/Exercise - 09/18/15 0001    Exercises   Exercises Lumbar   Lumbar Exercises: Stretches   Active Hamstring Stretch 3 reps;30 seconds   Single Knee to Chest Stretch 3 reps;30 seconds   Standing Extension 5 reps   Lumbar Exercises: Supine   Ab Set 5 reps   Clam 5 reps   Bridge 5 reps                PT Education - 09/18/15 1608    Education provided Yes   Education Details HEP   Person(s) Educated Patient   Methods Explanation;Handout;Verbal cues   Comprehension Verbalized understanding          PT Short Term Goals - 09/18/15 1618    PT SHORT TERM GOAL #1   Title Pt to improve her hip and low back ROM to be able to pick items off the floor with ease   Time 3   Period Weeks   Status New   PT SHORT TERM GOAL #2   Title Pt core strength to improve one grade to allow pt to complete an hour of housework without having increased pain.    Time 3   Period Weeks   Status New   PT SHORT TERM GOAL #3   Title Pt to understand and to be using core stabiization with transitional motion to allow sit to stand and stand to sit to occur without any increased pain    Time 3   Period Weeks   Status New   PT SHORT TERM GOAL #4   Title Pt to be able to demonstrate correct body mechanics for washing face, getting items out of a low cabinet in order to decrease stress on her low back to allow pain to decrease to no greater than  a 3/10  Time 3   Period Weeks   Status New           PT Long Term Goals - 09/18/15 1623    PT LONG TERM GOAL #1   Title Pt core sterngth to improve to a 4+/5 to allow pt to carry in groceries without having increased pain    Time 6   Period Weeks   PT LONG TERM GOAL #2   Title Pt to be able to demonsrate proper body mechanics for lifting 12 to waist as well as to operate a Landscape architectjack hammer in order to feel confident in returning to work    Time 6   Period Weeks   Status New   PT LONG TERM GOAL #3   Title Pt LE strength to be 5/5 to allow pt to use proper body mechanics with higher level activity to allow pt pain to decrease to no greater than a 1/10 to assist in returning to work.    Time 6   Period Weeks   Status New   PT LONG TERM GOAL #4   Title Pt to state that she is able to sleep on her back with comfort again    Time 6   Period Weeks   Status New               Plan - 09/18/15 1610    Clinical Impression Statement [name] is a 55 yo female who has been having progressive bilaterel back pain to the extent that she came out of work.  She feels her pain is related to a fall that she had in the summer due to losing her balance.    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    Examination demonstrate tight paraspinal musculature, decreased ROM, decreased proximla leg strength and increased pain.   [name] will beneft trom skilled therapy to address these issues.    Pt will benefit from skilled therapeutic intervention in order to improve on the following deficits Decreased activity tolerance;Decreased balance;Decreased range of motion;Decreased strength;Difficulty walking;Impaired flexibility;Pain   Rehab Potential Good   PT Frequency 2x / week   PT Duration 6 weeks   PT  Treatment/Interventions Ultrasound;Cryotherapy;Electrical Stimulation;Iontophoresis 4mg /ml Dexamethasone;Moist Heat;Traction;Gait training;Therapeutic exercise;Balance training;Manual techniques;Patient/family education   PT Next Visit Plan continue with stabilization including straight leg raise both prone and supine, heel squeezes; heel raises and functional squats;  add manual to decrease pain if needed    PT Home Exercise Plan given          Problem List Patient Active Problem List   Diagnosis Date Noted  . Menopausal symptoms 04/09/2015  . Dysphagia, pharyngoesophageal phase   . Dysphagia 08/21/2014  . Constipation 08/21/2014  . IRREGULAR MENSTRUAL CYCLE 11/15/2007  . VERTIGO, CHRONIC 11/15/2007  . HEADACHE 11/15/2007  . Junius FinnerSYNCOPE, HX OF 11/15/2007    Virgina Organynthia Russell, PT CLT [phone] 09/18/2015, 4:30 PM  [facility] [address] [place], [place], [address] Phone: [phone]   Fax:  [phone]  Name: Barkley BrunsJacqueline D Felipe MRN: [id number] Date of Birth: 02/09/1961

## 2015-09-18 NOTE — Patient Instructions (Signed)
Hamstring Stretch: Active    Support behind right knee. Starting with knee bent, attempt to straighten knee until a comfortable stretch is felt in back of thigh. Hold 30____ seconds. Repeat __3__ times per set. Do1 ____ sets per session. Do __2__ sessions per day.  http://orth.exer.us/158   Copyright  VHI. All rights reserved.  Knee-to-Chest Stretch: Unilateral    With hand behind right knee, pull knee in to chest until a comfortable stretch is felt in lower back and buttocks. Keep back relaxed. Hold _30___ seconds. Repeat _10___ times per set. Do __1__ sets per session. Do _2___ sessions per day.  http://orth.exer.us/126   Copyright  VHI. All rights reserved.  Isometric Abdominal    Lying on back with knees bent, tighten stomach by pressing elbows down. Hold ___5_ seconds. Repeat _10___ times per set. Do ___1_ sets per session. Do _3___ sessions per day.  http://orth.exer.us/1086   Copyright  VHI. All rights reserved.  Hip Abduction / Adduction: with Knee Flexion (Supine)    With right knee bent, gently lower knee to side and return. Repeat __10__ times per set. Do ___1_ sets per session. Do _2___ sessions per day.  http://orth.exer.us/682   Copyright  VHI. All rights reserved.  Bent Leg Lift (Hook-Lying)    Tighten stomach and slowly raise right leg __2__ inches from floor. Keep trunk rigid. Hold 2____ seconds. Repeat __10__ times per set. Do __1__ sets per session. Do 2____ sessions per day.  http://orth.exer.us/1090   Copyright  VHI. All rights reserved.  Bridging    Slowly raise buttocks from floor, keeping stomach tight. Repeat __10__ times per set. Do __1__ sets per session. Do ___2_ sessions per day.  http://orth.exer.us/1096   Copyright  VHI. All rights reserved.

## 2015-09-24 ENCOUNTER — Ambulatory Visit (HOSPITAL_COMMUNITY): Payer: BLUE CROSS/BLUE SHIELD

## 2015-09-24 DIAGNOSIS — R6889 Other general symptoms and signs: Secondary | ICD-10-CM

## 2015-09-24 DIAGNOSIS — R29898 Other symptoms and signs involving the musculoskeletal system: Secondary | ICD-10-CM

## 2015-09-24 DIAGNOSIS — M545 Low back pain, unspecified: Secondary | ICD-10-CM

## 2015-09-24 NOTE — Therapy (Signed)
[place] [facility] [address] [place], [place], [address] Phone: [phone]   Fax:  [phone]  Physical Therapy Treatment  Patient Details  Name: Sandra Davies MRN: [id number] Date of Birth: [birth year] Referring Provider: Darreld Mclean  Encounter Date: 09/24/2015      PT End of Session - 09/24/15 1751    Visit Number 2   Number of Visits 12   Date for PT Re-Evaluation 10/09/15   Authorization Type BCBS   PT Start Time 1736   PT Stop Time 1823   PT Time Calculation (min) 47 min   Activity Tolerance Patient tolerated treatment well   Behavior During Therapy [facility] for tasks assessed/performed      Past Medical History  Diagnosis Date  . Dysphagia 2015  . Constipation - functional     FOR A LONG TIME  . Arthritis   . GERD (gastroesophageal reflux disease)     Past Surgical History  Procedure Laterality Date  . Knee surgery    . Esophagogastroduodenoscopy N/A 09/03/2014    ONG:EXBMWUXL gastritis/dysphagia due to distal esophagel stricture  . Esophageal dilation N/A 09/03/2014    Procedure: ESOPHAGEAL DILATION;  Surgeon: West Bali, MD;  [facility];  Service: Endoscopy;  Laterality: N/A;  . Esophagogastroduodenoscopy N/A 09/26/2014    KGM:WNUU non-erosive gastritis/stricture at the gastroesphageal    There were no vitals filed for this visit.  Visit Diagnosis:  Proximal leg weakness  Bilateral low back pain without sciatica  Decreased activity tolerance      Subjective Assessment - 09/24/15 1741    Subjective Pt stated her Rt hip bursitis and Rt flank to core pain scale 5/10   Pertinent History Rt knee surgery    Patient Stated Goals less pain; get back to work    Currently in Pain? Yes   Pain Score 5    Pain Location Back   Pain Orientation Right;Lower   Pain Descriptors / Indicators Aching;Dull   Pain Type Chronic pain   Pain Frequency Intermittent   Aggravating Factors  sitting down,(the act of);  lying on her back    Pain Relieving Factors ointment, baths   Multiple Pain Sites Yes   Pain Score 5   Pain Location Hip   Pain Orientation Right   Pain Descriptors / Indicators Aching;Dull   Pain Type Chronic pain   Pain Onset More than a month ago   Pain Frequency Intermittent   Aggravating Factors  sitting down,(the act of); lying on her back    Pain Relieving Factors ointment, baths             [facility] Adult PT Treatment/Exercise - 09/24/15 0001    Lumbar Exercises: Stretches   Active Hamstring Stretch 3 reps;30 seconds  1set) arms behind knee 2) rope   Single Knee to Chest Stretch 3 reps;30 seconds   Lumbar Exercises: Standing   Heel Raises 10 reps   Functional Squats 10 reps   Lumbar Exercises: Supine   Ab Set 10 reps;3 seconds   AB Set Limitations cueing for proper activation   Clam 10 reps   Bent Knee Raise 10 reps;5 seconds   Bent Knee Raise Limitations with ab set   Bridge 10 reps   Straight Leg Raise 10 reps   Lumbar Exercises: Prone   Straight Leg Raise 10 reps   Other Prone Lumbar Exercises heel squeeze 10 x5"            PT Short Term Goals -  09/18/15 1618    PT SHORT TERM GOAL #1   Title Pt to improve her hip and low back ROM to be able to pick items off the floor with ease   Time 3   Period Weeks   Status New   PT SHORT TERM GOAL #2   Title Pt core strength to improve one grade to allow pt to complete an hour of housework without having increased pain.    Time 3   Period Weeks   Status New   PT SHORT TERM GOAL #3   Title Pt to understand and to be using core stabiization with transitional motion to allow sit to stand and stand to sit to occur without any increased pain    Time 3   Period Weeks   Status New   PT SHORT TERM GOAL #4   Title Pt to be able to demonstrate correct body mechanics for washing face, getting items out of a low cabinet in order to decrease stress on her low back to allow pain to decrease to no greater than a 3/10     Time 3   Period Weeks   Status New           PT Long Term Goals - 09/18/15 1623    PT LONG TERM GOAL #1   Title Pt core sterngth to improve to a 4+/5 to allow pt to carry in groceries without having increased pain    Time 6   Period Weeks   PT LONG TERM GOAL #2   Title Pt to be able to demonsrate proper body mechanics for lifting 12 to waist as well as to operate a Landscape architectjack hammer in order to feel confident in returning to work    Time 6   Period Weeks   Status New   PT LONG TERM GOAL #3   Title Pt LE strength to be 5/5 to allow pt to use proper body mechanics with higher level activity to allow pt pain to decrease to no greater than a 1/10 to assist in returning to work.    Time 6   Period Weeks   Status New   PT LONG TERM GOAL #4   Title Pt to state that she is able to sleep on her back with comfort again    Time 6   Period Weeks   Status New               Plan - 09/24/15 1815    Clinical Impression Statement Reviewed goals, compliance with HEP and copy of evaluation given to pt.  Reviewed technqiue with HEP with multimodal cueing to improve core activation.  Session foucs on improving core stabilization and proximal musculature strengthening to reduce stress on lower back and pain control.  Pt did c/o cramping Rt thigh area, able to reduce with stretches.  End of session no reports of pain.     Pt will benefit from skilled therapeutic intervention in order to improve on the following deficits Decreased activity tolerance;Decreased balance;Decreased range of motion;Decreased strength;Difficulty walking;Impaired flexibility;Pain   Rehab Potential Good   PT Frequency 2x / week   PT Duration 6 weeks   PT Treatment/Interventions Ultrasound;Cryotherapy;Electrical Stimulation;Iontophoresis 4mg /ml Dexamethasone;Moist Heat;Traction;Gait training;Therapeutic exercise;Balance training;Manual techniques;Patient/family education   PT Next Visit Plan continue with stabilization  including straight leg raise both prone and supine, heel squeezes; heel raises and functional squats;  add manual to decrease pain if needed         Problem  List Patient Active Problem List   Diagnosis Date Noted  . Menopausal symptoms 04/09/2015  . Dysphagia, pharyngoesophageal phase   . Dysphagia 08/21/2014  . Constipation 08/21/2014  . IRREGULAR MENSTRUAL CYCLE 11/15/2007  . VERTIGO, CHRONIC 11/15/2007  . HEADACHE 11/15/2007  . SYNCOPE, HX OF 11/15/2007   Becky Sax, LPTA; CBIS [phone]  Juel Burrow 09/24/2015, 6:30 PM  [place] [facility] [address] [place], [place], [address] Phone: [phone]   Fax:  [phone]  Name: Sandra Davies MRN: [id number] Date of Birth: 1960/09/01

## 2015-09-25 ENCOUNTER — Ambulatory Visit (HOSPITAL_COMMUNITY): Payer: BLUE CROSS/BLUE SHIELD | Admitting: Physical Therapy

## 2015-09-25 ENCOUNTER — Ambulatory Visit (INDEPENDENT_AMBULATORY_CARE_PROVIDER_SITE_OTHER): Payer: BLUE CROSS/BLUE SHIELD | Admitting: Orthopaedic Surgery

## 2015-09-25 ENCOUNTER — Encounter: Payer: Self-pay | Admitting: Orthopaedic Surgery

## 2015-09-25 ENCOUNTER — Telehealth (HOSPITAL_COMMUNITY): Payer: Self-pay | Admitting: Physical Therapy

## 2015-09-25 VITALS — BP 145/91 | HR 90 | Temp 97.3°F | Ht 67.5 in | Wt 161.0 lb

## 2015-09-25 DIAGNOSIS — M545 Low back pain, unspecified: Secondary | ICD-10-CM

## 2015-09-25 MED ORDER — CYCLOBENZAPRINE HCL 10 MG PO TABS: 10.0000 mg | ORAL_TABLET | Freq: Three times a day (TID) | ORAL | Status: DC | PRN

## 2015-09-25 NOTE — Telephone Encounter (Signed)
Patient a no-show for today's session. Called and left message detailing time/date of next session.    Nedra HaiKristen Miaa Latterell PT, DPT [phone]

## 2015-09-26 NOTE — Progress Notes (Signed)
Patient ZO:[id number] [name], female DOB:[birth year], 55 y.o. WJX:[id number]  Chief Complaint  Patient presents with  . Follow-up    back    HPI  PAIZLEE KINDER is a 55 y.o. female who has had lower back pain that has been persistent.  She has been going to physical therapy recently and is slowly getting better.  She is pleased she is finally making progress now.  She has less lower back pain, no paresthesias.  She is doing her exercises regularly. She has no new acute episodes of pain.  She has no bowel or bladder problems.  Her pain medicine helps as well.  She is taking her anti-spasm medicine which helps too.  HPI  Body mass index is 24.83 kg/(m^2).  Review of Systems  Constitutional:       Patient does not have Diabetes Mellitus. Patient does not have hypertension. Patient does not have COPD or shortness of breath. Patient does not have BMI > 35. Patient does not have current smoking history.   HENT: Negative for congestion.   Respiratory: Negative for cough and shortness of breath.   Cardiovascular: Negative for chest pain.  Gastrointestinal: Positive for constipation.       GERD  Endocrine: Positive for cold intolerance.  Musculoskeletal: Positive for back pain and joint swelling (right knee).  All other systems reviewed and are negative.   Past Medical History  Diagnosis Date  . Dysphagia 2015  . Constipation - functional     FOR A LONG TIME  . Arthritis   . GERD (gastroesophageal reflux disease)     Past Surgical History  Procedure Laterality Date  . Knee surgery    . Esophagogastroduodenoscopy N/A 09/03/2014    OZH:YQMVHQIO gastritis/dysphagia due to distal esophagel stricture  . Esophageal dilation N/A 09/03/2014    Procedure: ESOPHAGEAL DILATION;  Surgeon: West Bali, MD;  [facility];  Service: Endoscopy;  Laterality: N/A;  . Esophagogastroduodenoscopy N/A 09/26/2014    NGE:XBMW non-erosive gastritis/stricture at the gastroesphageal     Family History  Problem Relation Age of Onset  . Breast cancer Mother     DECEASED  . Colon polyps Neg Hx   . Colon cancer Neg Hx   . Stomach cancer Neg Hx   . Alzheimer's disease Father   . Heart disease Brother   . Lupus Sister     Social History Social History  Substance Use Topics  . Smoking status: Never Smoker   . Smokeless tobacco: Never Used  . Alcohol Use: 0.0 oz/week    0 Standard drinks or equivalent per week     Comment: very occasional    No Known Allergies  Current Outpatient Prescriptions  Medication Sig Dispense Refill  . cyclobenzaprine (FLEXERIL) 10 MG tablet Take 1 tablet (10 mg total) by mouth 3 (three) times daily as needed for muscle spasms. 90 tablet 3  . diclofenac (VOLTAREN) 75 MG EC tablet Take 1 tablet (75 mg total) by mouth 2 (two) times daily with a meal. 60 tablet 2  . diphenhydrAMINE (BENADRYL) 25 MG tablet Take 25 mg by mouth every 6 (six) hours as needed for allergies. Reported on 08/21/2015    . estradiol (ALORA) 0.1 MG/24HR patch Place 1 patch (0.1 mg total) onto the skin 2 (two) times a week. 8 patch 12  . HYDROcodone-acetaminophen (NORCO) 7.5-325 MG tablet Take 1 tablet by mouth every 4 (four) hours as needed for moderate pain (Must last 30 days.  Do not drive or operate machinery  while taking this medicine.). 120 tablet 0  . omeprazole (PRILOSEC) 20 MG capsule TAKE 1 CAPSULE BY MOUTH 30 MINUTES PRIOR TO BREAKFAST AND SUPPER 60 capsule 11  . progesterone (PROMETRIUM) 200 MG capsule Take 1 capsule (200 mg total) by mouth daily. Take x 14 days each month (first 14 days) 42 capsule 3   No current facility-administered medications for this visit.     Physical Exam  Blood pressure 145/91, pulse 90, temperature 97.3 F (36.3 C), height 5' 7.5" (1.715 m), weight 161 lb (73.029 kg), last menstrual period 07/02/2013.  Constitutional: overall normal hygiene, normal nutrition, well developed, normal grooming, normal body habitus. Assistive  device:none  Musculoskeletal: gait and station Limp none, muscle tone and strength are normal, no tremors or atrophy is present.  .  Neurological: coordination overall normal.  Deep tendon reflex/nerve stretch intact.  Sensation normal.  Cranial nerves II-XII intact.   Skin:   normal overall no scars, lesions, ulcers or rashes. No psoriasis.  Psychiatric: Alert and oriented x 3.  Recent memory intact, remote memory unclear.  Normal mood and affect. Well groomed.  Good eye contact.  Cardiovascular: overall no swelling, no varicosities, no edema bilaterally, normal temperatures of the legs and arms, no clubbing, cyanosis and good capillary refill.  Lymphatic: palpation is normal.  Spine/Pelvis examination:  Inspection:  Overall, sacoiliac joint benign and hips nontender; without crepitus or defects.   Thoracic spine inspection: Alignment normal without kyphosis present   Lumbar spine inspection:  Alignment  with normal lumbar lordosis, without scoliosis apparent.   Thoracic spine palpation:  without tenderness of spinal processes   Lumbar spine palpation: with tenderness of lumbar area; without tightness of lumbar muscles    Range of Motion:   Lumbar flexion, forward flexion is 35  without pain or tenderness    Lumbar extension is 10  without pain or tenderness   Left lateral bend is Normal  without pain or tenderness   Right lateral bend is Normal without pain or tenderness   Straight leg raising is Normal   Strength & tone: Normal   Stability overall normal stability     The patient has been educated about the nature of the problem(s) and counseled on treatment options.  The patient appeared to understand what I have discussed and is in agreement with it.  Encounter Diagnosis  Name Primary?  . Midline low back pain without sciatica Yes    PLAN Call if any problems.  Precautions discussed.  Continue current medications.   Continue PT.  Return to clinic 2  weeks

## 2015-09-30 ENCOUNTER — Ambulatory Visit (HOSPITAL_COMMUNITY): Payer: BLUE CROSS/BLUE SHIELD

## 2015-09-30 DIAGNOSIS — R6889 Other general symptoms and signs: Secondary | ICD-10-CM

## 2015-09-30 DIAGNOSIS — R29898 Other symptoms and signs involving the musculoskeletal system: Secondary | ICD-10-CM

## 2015-09-30 DIAGNOSIS — M545 Low back pain, unspecified: Secondary | ICD-10-CM

## 2015-09-30 NOTE — Therapy (Signed)
[place] [facility] [address] [place], [place], [address] Phone: [phone]   Fax:  [phone]  Physical Therapy Treatment  Patient Details  Name: Sandra Davies MRN: [id number] Date of Birth: 1961-01-13 Referring Provider: Darreld Mclean  Encounter Date: 09/30/2015      PT End of Session - 09/30/15 0759    Visit Number 3   Number of Visits 12   Date for PT Re-Evaluation 10/09/15   Authorization Type BCBS   PT Start Time 0800   PT Stop Time 0844   PT Time Calculation (min) 44 min   Activity Tolerance Patient tolerated treatment well   Behavior During Therapy [facility] for tasks assessed/performed      Past Medical History  Diagnosis Date  . Dysphagia 2015  . Constipation - functional     FOR A LONG TIME  . Arthritis   . GERD (gastroesophageal reflux disease)     Past Surgical History  Procedure Laterality Date  . Knee surgery    . Esophagogastroduodenoscopy N/A 09/03/2014    ONG:EXBMWUXL gastritis/dysphagia due to distal esophagel stricture  . Esophageal dilation N/A 09/03/2014    Procedure: ESOPHAGEAL DILATION;  Surgeon: West Bali, MD;  [facility];  Service: Endoscopy;  Laterality: N/A;  . Esophagogastroduodenoscopy N/A 09/26/2014    KGM:WNUU non-erosive gastritis/stricture at the gastroesphageal    There were no vitals filed for this visit.  Visit Diagnosis:  Proximal leg weakness  Bilateral low back pain without sciatica  Decreased activity tolerance      Subjective Assessment - 09/30/15 0801    Subjective Pt reported that her low back is sore today, which she attributed to "the rainy weather". Her low back soreness was rated a 4/10 on a  VAS. In addition, she c/o R hip pain associated with her hx of hip bursitis that was rated a 6/10 on a VAS upon arrival to clinic. Pt reports that her LBP has improved with less frequent and severe pain reported.    Pertinent History Rt knee surgery    Patient Stated  Goals less pain; get back to work    Currently in Pain? Yes   Pain Score 4    Pain Location Back   Pain Orientation Right;Lower   Pain Descriptors / Indicators Aching;Dull   Pain Type Chronic pain   Pain Onset More than a month ago   Pain Frequency Intermittent   Aggravating Factors  sitting down and lifting   Pain Relieving Factors rest    Effect of Pain on Daily Activities limiting her ability to complete work related activities    Pain Score 6   Pain Location Hip   Pain Orientation Right   Pain Descriptors / Indicators Aching   Pain Type Chronic pain   Pain Onset More than a month ago   Pain Frequency Intermittent   Aggravating Factors  lying on her R side    Pain Relieving Factors rest, hot tub, and muscle pain rub    Effect of Pain on Daily Activities limiting her ability to complete work related activities                          [facility] Adult PT Treatment/Exercise - 09/30/15 0001    Lumbar Exercises: Stretches   Active Hamstring Stretch 3 reps;30 seconds   Active Hamstring Stretch Limitations bilateral   Single Knee to Chest Stretch 3 reps;30 seconds   Single Knee to Chest Stretch  Limitations bilateral   Piriformis Stretch 3 reps;30 seconds   Piriformis Stretch Limitations bilateral   Lumbar Exercises: Aerobic   Stationary Bike Nustep; LE only; level 2 x 6.5 minutes    Lumbar Exercises: Standing   Functional Squats 15 reps   Lumbar Exercises: Supine   Ab Set 10 reps;3 seconds   Bent Knee Raise 10 reps;5 seconds   Bent Knee Raise Limitations with ab set   Straight Leg Raise 10 reps   Straight Leg Raises Limitations 2 sets   Lumbar Exercises: Sidelying   Clam 15 reps   Clam Limitations red thera-band    Lumbar Exercises: Prone   Straight Leg Raise 15 reps   Manual Therapy   Manual Therapy Passive ROM   Manual therapy comments completed separately from ther ex   Passive ROM B hip  PROM into flexion/IR/ER x 10 reps with 5 sec hold                 PT Education - 09/30/15 0800    Education provided Yes   Education Details HEP and core bracing with functional mobil;ity activities    Person(s) Educated Patient   Methods Explanation   Comprehension Verbalized understanding          PT Short Term Goals - 09/18/15 1618    PT SHORT TERM GOAL #1   Title Pt to improve her hip and low back ROM to be able to pick items off the floor with ease   Time 3   Period Weeks   Status New   PT SHORT TERM GOAL #2   Title Pt core strength to improve one grade to allow pt to complete an hour of housework without having increased pain.    Time 3   Period Weeks   Status New   PT SHORT TERM GOAL #3   Title Pt to understand and to be using core stabiization with transitional motion to allow sit to stand and stand to sit to occur without any increased pain    Time 3   Period Weeks   Status New   PT SHORT TERM GOAL #4   Title Pt to be able to demonstrate correct body mechanics for washing face, getting items out of a low cabinet in order to decrease stress on her low back to allow pain to decrease to no greater than a 3/10    Time 3   Period Weeks   Status New           PT Long Term Goals - 09/18/15 1623    PT LONG TERM GOAL #1   Title Pt core sterngth to improve to a 4+/5 to allow pt to carry in groceries without having increased pain    Time 6   Period Weeks   PT LONG TERM GOAL #2   Title Pt to be able to demonsrate proper body mechanics for lifting 12 to waist as 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    Time 6   Period Weeks   Status New   PT LONG TERM GOAL #4  Title Pt to state that she is able to sleep on her back with  comfort again    Time 6   Period Weeks   Status New               Plan - 09/30/15 0759    Clinical Impression Statement PT tx was focused on B LE stretches, core stabilization in supine, and hip strengthening with addition of resistance. Initiated PT tx with warm-up on the Nustep with use of LE only in order to improve LE conditioning. Progressed clam shells from supine to S/L position with addition of red thera-band in order to improve lumbar/pelvic stabilization. Reviewed and completed ab set in supine with fair technique demo. Verbal cues required to maintain core bracing during alternating hip flexion. Improved technique demo once cues provided. Further added B hip PROM in order to improve mobility into IR/ER. Pt tolerated progressed ther ex with no issues reported.  LBP and hip pain were reduced to 3/10 on a VAS and 4/10 on a VAS, respectively. Pt is progressing towards stated goals with less LBP and improved activity tolerance. Continue with current POC and progress with lumbar stabilization ther ex as tolerated.    Pt will benefit from skilled therapeutic intervention in order to improve on the following deficits Decreased activity tolerance;Decreased balance;Decreased range of motion;Decreased strength;Difficulty walking;Impaired flexibility;Pain   Rehab Potential Good   PT Frequency 2x / week   PT Duration 6 weeks   PT Treatment/Interventions Ultrasound;Cryotherapy;Electrical Stimulation;Iontophoresis 4mg /ml Dexamethasone;Moist Heat;Traction;Gait training;Therapeutic exercise;Balance training;Manual techniques;Patient/family education   PT Next Visit Plan Next visit to focus on core stabilization in supine, LE stretches, functional squats with focus on core bracing, and hip strengthening. Progress core stabilization from supine to quadruped or seated position   PT Home Exercise Plan Reviewed HEP with no changes made this visit; Add piriformis stretch to HEP at next PT visit.     Recommended Other Services None at this time    Consulted and Agree with Plan of Care Patient        Problem List Patient Active Problem List   Diagnosis Date Noted  . Menopausal symptoms 04/09/2015  . Dysphagia, pharyngoesophageal phase   . Dysphagia 08/21/2014  . Constipation 08/21/2014  . IRREGULAR MENSTRUAL CYCLE 11/15/2007  . VERTIGO, CHRONIC 11/15/2007  . HEADACHE 11/15/2007  . SYNCOPE, HX OF 11/15/2007    Bonnee QuinGabe Aruna Nestler, PT, DPT  09/30/2015, 8:46 AM  [place] [facility] [address] [place], [place], [address] Phone: [phone]   Fax:  [phone]  Name: Sandra Davies MRN: [id number] Date of Birth: 01/21/1961

## 2015-10-02 ENCOUNTER — Ambulatory Visit (HOSPITAL_COMMUNITY): Payer: BLUE CROSS/BLUE SHIELD | Admitting: Physical Therapy

## 2015-10-02 DIAGNOSIS — M545 Low back pain, unspecified: Secondary | ICD-10-CM

## 2015-10-02 DIAGNOSIS — R6889 Other general symptoms and signs: Secondary | ICD-10-CM

## 2015-10-02 DIAGNOSIS — R29898 Other symptoms and signs involving the musculoskeletal system: Secondary | ICD-10-CM

## 2015-10-02 NOTE — Therapy (Signed)
[facility] [address] [place], [place], [address] Phone: [phone]   Fax:  [phone]  Physical Therapy Treatment  Patient Details  Name: Sandra Davies MRN: [id number] Date of Birth: 12/15/1960 Referring Provider: Darreld Mclean  Encounter Date: 10/02/2015      PT End of Session - 10/02/15 1550    Visit Number 4   Number of Visits 12   Date for PT Re-Evaluation 10/09/15   Authorization Type BCBS   PT Start Time 1503   PT Stop Time 1547   PT Time Calculation (min) 44 min   Activity Tolerance Patient tolerated treatment well   Behavior During Therapy [facility] for tasks assessed/performed      Past Medical History  Diagnosis Date  . Dysphagia 2015  . Constipation - functional     FOR A LONG TIME  . Arthritis   . GERD (gastroesophageal reflux disease)     Past Surgical History  Procedure Laterality Date  . Knee surgery    . Esophagogastroduodenoscopy N/A 09/03/2014    ONG:EXBMWUXL gastritis/dysphagia due to distal esophagel stricture  . Esophageal dilation N/A 09/03/2014    Procedure: ESOPHAGEAL DILATION;  Surgeon: West Bali, MD;  [facility];  Service: Endoscopy;  Laterality: N/A;  . Esophagogastroduodenoscopy N/A 09/26/2014    KGM:WNUU non-erosive gastritis/stricture at the gastroesphageal    There were no vitals filed for this visit.  Visit Diagnosis:  Proximal leg weakness  Bilateral low back pain without sciatica  Decreased activity tolerance      Subjective Assessment - 10/02/15 1506    Subjective Pt state she did some work around the house yesterday and is a little sore from that. She rates it a 4/10 VAS. She has been performing her HEP.   Pertinent History Rt knee surgery                          [facility] Adult PT Treatment/Exercise - 10/02/15 0001    Lumbar Exercises: Stretches   Active Hamstring Stretch 3 reps;30 seconds   Active Hamstring Stretch Limitations bilateral    Piriformis Stretch 3 reps;30 seconds   Piriformis Stretch Limitations bilateral   Lumbar Exercises: Supine   Bridge 10 reps   Bridge Limitations 2 sets  last set with pillow under hips for end range strengthening   Straight Leg Raise 10 reps   Straight Leg Raises Limitations with UE pressdown   Lumbar Exercises: Sidelying   Clam 15 reps  Rt   Clam Limitations red TB   Hip Abduction 15 reps  Lt   Hip Abduction Limitations against wall   Lumbar Exercises: Prone   Straight Leg Raise 5 reps  with UE pressdowns x3 sets each   Manual Therapy   Manual Therapy Soft tissue mobilization   Manual therapy comments completed separately from ther ex   Soft tissue mobilization trigger point release to R piriformis, R illiopsoas and LL hip adductor                PT Education - 10/02/15 1549    Education provided Yes   Education Details reviewed HEP and updated with piriformis stretch and red TB for clamshells   Person(s) Educated Patient   Methods Explanation   Comprehension Verbalized understanding          PT Short Term Goals - 09/18/15 1618    PT SHORT TERM GOAL #1   Title Pt to improve her hip  and low back ROM to be able to pick items off the floor with ease   Time 3   Period Weeks   Status New   PT SHORT TERM GOAL #2   Title Pt core strength to improve one grade to allow pt to complete an hour of housework without having increased pain.    Time 3   Period Weeks   Status New   PT SHORT TERM GOAL #3   Title Pt to understand and to be using core stabiization with transitional motion to allow sit to stand and stand to sit to occur without any increased pain    Time 3   Period Weeks   Status New   PT SHORT TERM GOAL #4   Title Pt to be able to demonstrate correct body mechanics for washing face, getting items out of a low cabinet in order to decrease stress on her low back to allow pain to decrease to no greater than a 3/10    Time 3   Period Weeks   Status New            PT Long Term Goals - 09/18/15 1623    PT LONG TERM GOAL #1   Title Pt core sterngth to improve to a 4+/5 to allow pt to carry in groceries without having increased pain    Time 6   Period Weeks   PT LONG TERM GOAL #2   Title Pt to be able to demonsrate proper body mechanics for lifting 12 to waist as well as to operate a Landscape architectjack hammer in order to feel confident in returning to work    Time 6   Period Weeks   Status New   PT LONG TERM GOAL #3   Title Pt LE strength to be 5/5 to allow pt to use proper body mechanics with higher level activity to allow pt pain to decrease to no greater than a 1/10 to assist in returning to work.    Time 6   Period Weeks   Status New   PT LONG TERM GOAL #4   Title Pt to state that she is able to sleep on her back with comfort again    Time 6   Period Weeks   Status New               Plan - 10/02/15 1550    Clinical Impression Statement Pt presenting with increased trigger points and pain report noted with palpation along her R piriformis region. Manual treatment was performed with some relief. She continues to demonstrate poor glute strength and activation leading to increased hip pain report during activity. Attempted sidelying hip abduction strengthening exercise with increase in R hip pain, so activity was changed to clamshell with improved pain report. Will continue current POC addressing limitations in hip strength, flexibility and trunk strength to improve activity tolerance.   Pt will benefit from skilled therapeutic intervention in order to improve on the following deficits Decreased activity tolerance;Decreased balance;Decreased range of motion;Decreased strength;Difficulty walking;Impaired flexibility;Pain   Rehab Potential Good   PT Frequency 2x / week   PT Duration 6 weeks   PT Treatment/Interventions Ultrasound;Cryotherapy;Electrical Stimulation;Iontophoresis 4mg /ml Dexamethasone;Moist Heat;Traction;Gait training;Therapeutic  exercise;Balance training;Manual techniques;Patient/family education   PT Next Visit Plan  core stabilization in supine, LE stretches, functional squats with focus on core bracing, and hip strengthening. Progress core stabilization from supine to quadruped or seated position; manual treatment to address soft tissue restrictions as needed for pain control  PT Home Exercise Plan Updated HEP with piriformis stretch and provided red TB for clamshells   Consulted and Agree with Plan of Care Patient        Problem List Patient Active Problem List   Diagnosis Date Noted  . Menopausal symptoms 04/09/2015  . Dysphagia, pharyngoesophageal phase   . Dysphagia 08/21/2014  . Constipation 08/21/2014  . IRREGULAR MENSTRUAL CYCLE 11/15/2007  . VERTIGO, CHRONIC 11/15/2007  . HEADACHE 11/15/2007  . SYNCOPE, HX OF 11/15/2007    3:58 PM,10/02/2015 Marylyn Ishihara PT, DPT [facility] [phone]  [facility] [address] [place], [place], [address] Phone: [phone]   Fax:  [phone]  Name: JEYLI ZWICKER MRN: [id number] Date of Birth: 1961/03/29

## 2015-10-03 ENCOUNTER — Telehealth: Payer: Self-pay | Admitting: Orthopaedic Surgery

## 2015-10-03 NOTE — Telephone Encounter (Signed)
Patient requesting refill of Hydrocodone 7.5/325mg Qty 120 Tablets °

## 2015-10-06 MED ORDER — HYDROCODONE-ACETAMINOPHEN 7.5-325 MG PO TABS: 1.0000 | ORAL_TABLET | ORAL | Status: DC | PRN

## 2015-10-06 NOTE — Telephone Encounter (Signed)
Rx done. 

## 2015-10-07 ENCOUNTER — Ambulatory Visit (HOSPITAL_COMMUNITY): Payer: BLUE CROSS/BLUE SHIELD | Attending: Orthopedic Surgery

## 2015-10-07 DIAGNOSIS — M545 Low back pain, unspecified: Secondary | ICD-10-CM

## 2015-10-07 DIAGNOSIS — M6281 Muscle weakness (generalized): Secondary | ICD-10-CM | POA: Insufficient documentation

## 2015-10-07 DIAGNOSIS — R6889 Other general symptoms and signs: Secondary | ICD-10-CM | POA: Insufficient documentation

## 2015-10-07 DIAGNOSIS — R29898 Other symptoms and signs involving the musculoskeletal system: Secondary | ICD-10-CM | POA: Insufficient documentation

## 2015-10-07 NOTE — Patient Instructions (Addendum)
Piriformis Stretch, Sitting    Sit, one ankle on opposite knee, same-side hand on crossed knee. Push down on knee, keeping spine straight. Lean torso forward, with flat back, until tension is felt in hamstrings and gluteals of crossed-leg side. Hold 30 seconds.  Repeat 3 times per session. Do 1-2 sessions per day.  Copyright  VHI. All rights reserved.   Piriformis Stretch, Supine    Lie supine, one ankle crossed onto opposite knee. Holding bottom leg behind knee, gently pull legs toward chest until stretch is felt in buttock of top leg. Hold 30 seconds. For deeper stretch gently push top knee away from body.  Repeat 3 times per session. Do 1-2 sessions per day.  Copyright  VHI. All rights reserved.   Heel Squeeze (Prone)    Abdomen supported, bend knees and gently squeeze heels together making a V shape with feet. Hold 5 seconds. Repeat 10-20  times per set. Do 1-2 sets per session.   http://orth.exer.us/1081   Copyright  VHI. All rights reserved.   Abduction: Clam (Eccentric) - Side-Lying    Lie on side with knees bent. Lift top knee, keeping feet together. Keep trunk steady. Slowly lower for 3-5 seconds. 10-20 reps per set, 1-2 sets per day, 3-5 days per week.  http://ecce.exer.us/65   Copyright  VHI. All rights reserved.

## 2015-10-07 NOTE — Therapy (Signed)
[facility] [address] [place], [place], [address] Phone: [phone]   Fax:  [phone]  Physical Therapy Treatment  Patient Details  Name: Sandra Davies MRN: [id number] Date of Birth: 1961-06-21 Referring Provider: Darreld Mclean  Encounter Date: 10/07/2015      PT End of Session - 10/07/15 1420    Visit Number 5   Number of Visits 12   Date for PT Re-Evaluation 10/09/15   Authorization Type BCBS   PT Start Time 1340   PT Stop Time 1430   PT Time Calculation (min) 50 min   Activity Tolerance Patient tolerated treatment well   Behavior During Therapy [facility] for tasks assessed/performed      Past Medical History  Diagnosis Date  . Dysphagia 2015  . Constipation - functional     FOR A LONG TIME  . Arthritis   . GERD (gastroesophageal reflux disease)     Past Surgical History  Procedure Laterality Date  . Knee surgery    . Esophagogastroduodenoscopy N/A 09/03/2014    ONG:EXBMWUXL gastritis/dysphagia due to distal esophagel stricture  . Esophageal dilation N/A 09/03/2014    Procedure: ESOPHAGEAL DILATION;  Surgeon: West Bali, MD;  [facility];  Service: Endoscopy;  Laterality: N/A;  . Esophagogastroduodenoscopy N/A 09/26/2014    KGM:WNUU non-erosive gastritis/stricture at the gastroesphageal    There were no vitals filed for this visit.  Visit Diagnosis:  Proximal leg weakness  Bilateral low back pain without sciatica  Decreased activity tolerance      Subjective Assessment - 10/07/15 1337    Subjective Pt stated she has been doing more around the house, making sheets on bed is most difficult task.  Current pain center of lower back and Rt hip due to bursitiis pain scale 5/10   Pertinent History Rt knee surgery    Patient Stated Goals less pain; get back to work    Currently in Pain? Yes   Pain Score 5    Pain Location Back   Pain Orientation Lower   Pain Descriptors / Indicators Aching   Pain  Type Chronic pain   Pain Onset More than a month ago   Pain Frequency Intermittent   Aggravating Factors  sitting down and lifting   Pain Relieving Factors rest   Effect of Pain on Daily Activities limiting her ability to complete work related activities   Pain Score 5   Pain Location Hip   Pain Orientation Right   Pain Descriptors / Indicators Aching   Pain Type Chronic pain   Pain Onset More than a month ago   Pain Frequency Intermittent   Aggravating Factors  lying on her R side   Pain Relieving Factors rest, hot tub, and muscle pain rub   Effect of Pain on Daily Activities limiting her ability to complete work related activities                [facility] Adult PT Treatment/Exercise - 10/07/15 0001    Lumbar Exercises: Stretches   Active Hamstring Stretch 3 reps;30 seconds   Active Hamstring Stretch Limitations BLE supine with rope   Piriformis Stretch 3 reps;30 seconds   Piriformis Stretch Limitations bilateral 1st set in seated 2nd/3rd supine figure 4 with towel   Lumbar Exercises: Standing   Functional Squats 15 reps   Lumbar Exercises: Sidelying   Clam 15 reps  Bil LE with RTB   Clam Limitations red TB   Hip Abduction 15 reps  c/o  increased pain Rt hip   Hip Abduction Limitations against wall   Lumbar Exercises: Quadruped   [place]/Old Horse 5 reps   Single Arm Raise Right;Left;5 reps;3 seconds   Straight Leg Raise 5 reps;3 seconds   Manual Therapy   Manual Therapy Soft tissue mobilization   Manual therapy comments completed separately from ther ex   Soft tissue mobilization trigger point release to R piriformis, R illiopsoas and LL hip adductor            PT Short Term Goals - 09/18/15 1618    PT SHORT TERM GOAL #1   Title Pt to improve her hip and low back ROM to be able to pick items off the floor with ease   Time 3   Period Weeks   Status New   PT SHORT TERM GOAL #2   Title Pt core strength to improve one grade to allow pt to complete an hour of  housework without having increased pain.    Time 3   Period Weeks   Status New   PT SHORT TERM GOAL #3   Title Pt to understand and to be using core stabiization with transitional motion to allow sit to stand and stand to sit to occur without any increased pain    Time 3   Period Weeks   Status New   PT SHORT TERM GOAL #4   Title Pt to be able to demonstrate correct body mechanics for washing face, getting items out of a low cabinet in order to decrease stress on her low back to allow pain to decrease to no greater than a 3/10    Time 3   Period Weeks   Status New           PT Long Term Goals - 09/18/15 1623    PT LONG TERM GOAL #1   Title Pt core sterngth to improve to a 4+/5 to allow pt to carry in groceries without having increased pain    Time 6   Period Weeks   PT LONG TERM GOAL #2   Title Pt to be able to demonsrate proper body mechanics for lifting 12 to waist as well as to operate a Landscape architectjack hammer in order to feel confident in returning to work    Time 6   Period Weeks   Status New   PT LONG TERM GOAL #3   Title Pt LE strength to be 5/5 to allow pt to use proper body mechanics with higher level activity to allow pt pain to decrease to no greater than a 1/10 to assist in returning to work.    Time 6   Period Weeks   Status New   PT LONG TERM GOAL #4   Title Pt to state that she is able to sleep on her back with comfort again    Time 6   Period Weeks   Status New               Plan - 10/07/15 1424    Clinical Impression Statement PT session on improving core and proximal musculature strengthening and manual techniques to reduce pain.  Progressed to quadruped for core and gluteal  strengthening with cueing for stability with new exercise.  Pt continues to demonstreat poor gluteal medius strengthening and activation leading to increased hip pain with sidelying abduction, reports increased tolerance with sidelying clam activities.  Pt presenting with increased  trigger point and reports of pain along Rt piriformis region.  Manual  techniques complete to reduce trigger point and soft tissue mobilization to Rt piriformis region.     Pt will benefit from skilled therapeutic intervention in order to improve on the following deficits Decreased activity tolerance;Decreased balance;Decreased range of motion;Decreased strength;Difficulty walking;Impaired flexibility;Pain   Rehab Potential Good   PT Frequency 2x / week   PT Duration 6 weeks   PT Treatment/Interventions Ultrasound;Cryotherapy;Electrical Stimulation;Iontophoresis /ml Dexamethasone;Moist Heat;Traction;Gait training;Therapeutic exercise;Balance training;Manual techniques;Patient/family education   PT Next Visit Plan  core stabilization in supine, LE stretches, functional squats with focus on core bracing, and hip strengthening. Progress core stabilization from supine to quadruped or seated position; manual treatment to address soft tissue restrictions as needed for pain control   PT Home Exercise Plan Given handout for HEP including piriformis stretches, prone heel squeeze and clamshells.        Problem List Patient Active Problem List   Diagnosis Date Noted  . Menopausal symptoms 04/09/2015  . Dysphagia, pharyngoesophageal phase   . Dysphagia 08/21/2014  . Constipation 08/21/2014  . IRREGULAR MENSTRUAL CYCLE 11/15/2007  . VERTIGO, CHRONIC 11/15/2007  . HEADACHE 11/15/2007  . SYNCOPE, HX OF 11/15/2007   Becky Sax, LPTA; CBIS [phone]  Juel Burrow 10/07/2015, 2:55 PM  [facility] [address] [place], [place], [address] Phone: [phone]   Fax:  [phone]  Name: KALESHA IRVING MRN: [id number] Date of Birth: Feb 15, 1961

## 2015-10-09 ENCOUNTER — Ambulatory Visit (HOSPITAL_COMMUNITY): Payer: BLUE CROSS/BLUE SHIELD

## 2015-10-09 ENCOUNTER — Ambulatory Visit (INDEPENDENT_AMBULATORY_CARE_PROVIDER_SITE_OTHER): Payer: BLUE CROSS/BLUE SHIELD | Admitting: Orthopaedic Surgery

## 2015-10-09 ENCOUNTER — Encounter: Payer: Self-pay | Admitting: Orthopaedic Surgery

## 2015-10-09 VITALS — BP 143/94 | HR 80 | Temp 97.4°F | Ht 67.5 in | Wt 165.6 lb

## 2015-10-09 DIAGNOSIS — M545 Low back pain, unspecified: Secondary | ICD-10-CM

## 2015-10-09 NOTE — Progress Notes (Signed)
[name], female DOB:11/17/1960, 55 y.o. WJX:[id number]  Chief Complaint  Patient presents with  . Back Pain    follow up physical therapy    HPI  [name] is a 55 y.o. female who is followed for lower back pain.  She is better.  She has gone to PT and is improving.  She is doing her exercises.  I have reviewed her notes.  I have gone over them with her.  She still has pain with bending and standing a while.  She has no new trauma.  She has no paresthesias.  She has no bowel or bladder problems.  I will keep her out of work.  HPI  Body mass index is 25.54 kg/(m^2).  Review of Systems  Constitutional:       Patient does not have Diabetes Mellitus. Patient does not have hypertension. Patient does not have COPD or shortness of breath. Patient does not have BMI > 35. Patient does not have current smoking history.   HENT: Negative for congestion.   Respiratory: Negative for cough and shortness of breath.   Cardiovascular: Negative for chest pain.  Gastrointestinal: Positive for constipation.       GERD  Endocrine: Positive for cold intolerance.  Musculoskeletal: Positive for back pain and joint swelling (right knee).  All other systems reviewed and are negative.   Past Medical History  Diagnosis Date  . Dysphagia 2015  . Constipation - functional     FOR A LONG TIME  . Arthritis   . GERD (gastroesophageal reflux disease)     Past Surgical History  Procedure Laterality Date  . Knee surgery    . Esophagogastroduodenoscopy N/A 09/03/2014    OZH:YQMVHQIOSLF:moderate gastritis/dysphagia due to distal esophagel stricture  . Esophageal dilation N/A 09/03/2014    Procedure: ESOPHAGEAL DILATION;  Surgeon: West BaliSandi L Fields, MD;  [facility];  Service: Endoscopy;  Laterality: N/A;  . Esophagogastroduodenoscopy N/A 09/26/2014    NGE:XBMWSLF:mild non-erosive gastritis/stricture at the gastroesphageal    Family History  Problem Relation Age of Onset  . Breast cancer  Mother     DECEASED  . Colon polyps Neg Hx   . Colon cancer Neg Hx   . Stomach cancer Neg Hx   . Alzheimer's disease Father   . Heart disease Brother   . Lupus Sister     Social History Social History  Substance Use Topics  . Smoking status: Never Smoker   . Smokeless tobacco: Never Used  . Alcohol Use: 0.0 oz/week    0 Standard drinks or equivalent per week     Comment: very occasional    No Known Allergies  Current Outpatient Prescriptions  Medication Sig Dispense Refill  . cyclobenzaprine (FLEXERIL) 10 MG tablet Take 1 tablet (10 mg total) by mouth 3 (three) times daily as needed for muscle spasms. 90 tablet 3  . diclofenac (VOLTAREN) 75 MG EC tablet Take 1 tablet (75 mg total) by mouth 2 (two) times daily with a meal. 60 tablet 2  . diphenhydrAMINE (BENADRYL) 25 MG tablet Take 25 mg by mouth every 6 (six) hours as needed for allergies. Reported on 08/21/2015    . estradiol (ALORA) 0.1 MG/24HR patch Place 1 patch (0.1 mg total) onto the skin 2 (two) times a week. 8 patch 12  . HYDROcodone-acetaminophen (NORCO) 7.5-325 MG tablet Take 1 tablet by mouth every 4 (four) hours as needed for moderate pain (Must last 30 days.  Do not drive or operate machinery while  taking this medicine.). 120 tablet 0  . omeprazole (PRILOSEC) 20 MG capsule TAKE 1 CAPSULE BY MOUTH 30 MINUTES PRIOR TO BREAKFAST AND SUPPER 60 capsule 11  . progesterone (PROMETRIUM) 200 MG capsule Take 1 capsule (200 mg total) by mouth daily. Take x 14 days each month (first 14 days) 42 capsule 3   No current facility-administered medications for this visit.     Physical Exam  Blood pressure 143/94, pulse 80, temperature 97.4 F (36.3 C), temperature source Tympanic, height 5' 7.5" (1.715 m), weight 165 lb 9.6 oz (75.116 kg), last menstrual period 07/02/2013.  Constitutional: overall normal hygiene, normal nutrition, well developed, normal grooming, normal body habitus. Assistive device:none  Musculoskeletal: gait  and station Limp none, muscle tone and strength are normal, no tremors or atrophy is present.  .  Neurological: coordination overall normal.  Deep tendon reflex/nerve stretch intact.  Sensation normal.  Cranial nerves II-XII intact.   Skin:   normal overall no scars, lesions, ulcers or rashes. No psoriasis.  Psychiatric: Alert and oriented x 3.  Recent memory intact, remote memory unclear.  Normal mood and affect. Well groomed.  Good eye contact.  Cardiovascular: overall no swelling, no varicosities, no edema bilaterally, normal temperatures of the legs and arms, no clubbing, cyanosis and good capillary refill.  Lymphatic: palpation is normal.  Spine/Pelvis examination:  Inspection:  Overall, sacoiliac joint benign and hips nontender; without crepitus or defects.   Thoracic spine inspection: Alignment normal without kyphosis present   Lumbar spine inspection:  Alignment  with normal lumbar lordosis, without scoliosis apparent.   Thoracic spine palpation:  without tenderness of spinal processes   Lumbar spine palpation: with tenderness of lumbar area; with tightness of lumbar muscles    Range of Motion:   Lumbar flexion, forward flexion is 35  without pain or tenderness    Lumbar extension is 10  without pain or tenderness   Left lateral bend is Normal  without pain or tenderness   Right lateral bend is Normal without pain or tenderness   Straight leg raising is Normal   Strength & tone: Normal   Stability overall normal stability   The patient has been educated about the nature of the problem(s) and counseled on treatment options.  The patient appeared to understand what I have discussed and is in agreement with it.  Encounter Diagnosis  Name Primary?  . Midline low back pain without sciatica Yes    PLAN Call if any problems.  Precautions discussed.  Continue current medications.   Return to clinic 2 weeks   Stay out of work.  Continue PT and home exercises.

## 2015-10-09 NOTE — Patient Instructions (Signed)
Stay out of work. 

## 2015-10-14 ENCOUNTER — Ambulatory Visit (HOSPITAL_COMMUNITY): Payer: BLUE CROSS/BLUE SHIELD | Admitting: Physical Therapy

## 2015-10-14 DIAGNOSIS — M545 Low back pain, unspecified: Secondary | ICD-10-CM

## 2015-10-14 DIAGNOSIS — R29898 Other symptoms and signs involving the musculoskeletal system: Secondary | ICD-10-CM | POA: Diagnosis not present

## 2015-10-14 DIAGNOSIS — M6281 Muscle weakness (generalized): Secondary | ICD-10-CM

## 2015-10-14 NOTE — Therapy (Signed)
[place], [place], [address] Phone: [phone]   Fax:  [phone]  Physical Therapy [facility] Reassessment/Re-Cert  Patient Details  Name: Sandra Davies MRN: [id number] Date of Birth: Dec 31, 1960 Referring Provider: Sanjuana Kava, MD  Encounter Date: 10/14/2015      PT End of Session - 10/14/15 1218    Visit Number 6   Number of Visits 12   Date for PT Re-Evaluation 10/09/15   Authorization Type BCBS   PT Start Time 0902   PT Stop Time 0945   PT Time Calculation (min) 43 min   Activity Tolerance Patient tolerated treatment well   Behavior During Therapy [facility] for tasks assessed/performed      Past Medical History  Diagnosis Date  . Dysphagia 2015  . Constipation - functional     FOR A LONG TIME  . Arthritis   . GERD (gastroesophageal reflux disease)     Past Surgical History  Procedure Laterality Date  . Knee surgery    . Esophagogastroduodenoscopy N/A 09/03/2014    SJG:[id number] gastritis/dysphagia due to distal esophagel stricture  . Esophageal dilation N/A 09/03/2014    Procedure: ESOPHAGEAL DILATION;  Surgeon: Danie Binder, MD;  [facility];  Service: Endoscopy;  Laterality: N/A;  . Esophagogastroduodenoscopy N/A 09/26/2014    TML:YYTK non-erosive gastritis/stricture at the gastroesphageal    There were no vitals filed for this visit.      Subjective Assessment - 10/14/15 0904    Subjective Pt states she's going back to her MD 10/23/15 to see if she will be going back to work. She is unsure how much heavy lifting, etc. she will be having to do once she returns. She plans on doing her HEP during work breaks. She notes pain in her Rt hip and R low back today. She was on her feet alot yesterday and feels this is why she is having increased pain.    Pertinent History Rt knee surgery    Currently in Pain? Yes   Pain Score 5   in Rt hip and back   Pain Location Back  hip   Pain  Orientation Right;Lower   Pain Descriptors / Indicators Aching   Pain Type Chronic pain   Pain Onset More than a month ago   Pain Frequency Intermittent   Aggravating Factors  being up and doing alot of extra work   Pain Relieving Factors exercises, pain meds   Effect of Pain on Daily Activities limiting full participation in activity.    Multiple Pain Sites Yes            [facility] PT Assessment - 10/14/15 0001    Assessment   Medical Diagnosis Lumbar strain   Referring Provider Sanjuana Kava, MD   Next MD Visit 10/23/15   Precautions   Precautions None   Restrictions   Weight Bearing Restrictions No   Balance Screen   Has the patient fallen in the past 6 months No   Has the patient had a decrease in activity level because of a fear of falling?  No   Is the patient reluctant to leave their home because of a fear of falling?  No   Home Environment   Living Environment Private residence   Home Access Level entry   [place] One level   Prior [place] Full time employment   Vocation Requirements Uses a handjack; pulls boxes out, on concrete    Posture/Postural Control  Posture/Postural Control No significant limitations   AROM   Lumbar Flexion 2 inches below knees with pull along low back   Lumbar Extension 10 deg   Strength   Right Hip Flexion 4+/5   Right Hip Extension 5/5   Right Hip ABduction 5/5   Left Hip Flexion 4+/5   Left Hip Extension 5/5   Left Hip ABduction 5/5   Right Knee Flexion 4-/5   Right Knee Extension 5/5   Left Knee Flexion 5/5   Left Knee Extension 5/5   Right Ankle Dorsiflexion 5/5   Left Ankle Dorsiflexion 5/5   Flexibility   Soft Tissue Assessment /Muscle Length --                     [facility] Adult PT Treatment/Exercise - 10/14/15 0001    Lumbar Exercises: Stretches   Quad Stretch 3 reps;30 seconds  quad/hip flexor stretch   Quad Stretch Limitations supine with strap    ITB Stretch 3 reps;30 seconds   ITB  Stretch Limitations standing   Manual Therapy   Manual Therapy Soft tissue mobilization   Manual therapy comments completed separately from ther ex   Soft tissue mobilization TrP release R piriformis; rolling catch and release along ITB and STM to glute med and TFL  instructed pt in proper technique with rolling ITB/quad/HS                PT Education - 10/14/15 1217    Education provided Yes   Education Details updated HEP; discussed goals and POC; reviewed proper log roll technique for getting out of bed.   Person(s) Educated Patient   Methods Explanation;Handout   Comprehension Verbalized understanding;Returned demonstration          PT Short Term Goals - 10/14/15 0941    PT SHORT TERM GOAL #1   Title Pt to improve her hip and low back ROM to be able to pick items off the floor with ease   Time 3   Period Weeks   Status New   PT SHORT TERM GOAL #2   Title Pt core strength to improve one grade to allow pt to complete an hour of housework without having increased pain.    Baseline Pt is sweeping and light house duties   Time 3   Period Weeks   Status Partially Met   PT SHORT TERM GOAL #3   Title Pt to understand and to be using core stabiization with transitional motion to allow sit to stand and stand to sit to occur without any increased pain    Baseline decreased pain with sit/stand   Time 3   Period Weeks   Status New   PT SHORT TERM GOAL #4   Title Pt to be able to demonstrate correct body mechanics for washing face, getting items out of a low cabinet in order to decrease stress on her low back to allow pain to decrease to no greater than a 3/10    Time 3   Period Weeks   Status New           PT Long Term Goals - 09/18/15 1623    PT LONG TERM GOAL #1   Title Pt core sterngth to improve to a 4+/5 to allow pt to carry in groceries without having increased pain    Time 6   Period Weeks   PT LONG TERM GOAL #2   Title Pt to be able to demonsrate proper  body  mechanics for lifting 12 to waist as well as to operate a Scientist, water quality in order to feel confident in returning to work    Time 6   Period Weeks   Status New   PT LONG TERM GOAL #3   Title Pt LE strength to be 5/5 to allow pt to use proper body mechanics with higher level activity to allow pt pain to decrease to no greater than a 1/10 to assist in returning to work.    Time 6   Period Weeks   Status New   PT LONG TERM GOAL #4   Title Pt to state that she is able to sleep on her back with comfort again    Time 6   Period Weeks   Status New               Plan - 10/14/15 1218    Clinical Impression Statement Pt was reassessed this session and is progressing towards her goals. Her strength has improved in BLE except Rt hamstring strength which decreased to less than 4/5 MMT. Overall she feels her pain is improving and she is supposed to discuss return to work with her MD in a few weeks. This session focused on manual techniques to improve pain muscle relaxation as she was noted to have increased pain and muscle restrictions along her ITB. Pt was able to return demonstration for self-rolling technique to perform at home without any confusion. Will continue with current POC addressing limitations in strength, flexibility, trunk stability and begin addressing body ergonomics with potential return to work. Will continue current POC.   Rehab Potential Good   PT Frequency 2x / week   PT Duration 4 weeks   PT Treatment/Interventions Ultrasound;Cryotherapy;Electrical Stimulation;Iontophoresis 44m/ml Dexamethasone;Moist Heat;Traction;Gait training;Therapeutic exercise;Balance training;Manual techniques;Patient/family education   PT Next Visit Plan  core stabilization in supine, LE stretches, functional squats with focus on core bracing, and hip strengthening. Progress core stabilization from supine to quadruped or seated position; manual treatment to address soft tissue restrictions as needed for  pain control   PT Home Exercise Plan updated HEP with supine hip flexor stretch, ITB rolling and ITB stretch   Consulted and Agree with Plan of Care Patient      Patient will benefit from skilled therapeutic intervention in order to improve the following deficits and impairments:  Decreased activity tolerance, Decreased balance, Decreased range of motion, Decreased strength, Difficulty walking, Impaired flexibility, Pain  Visit Diagnosis: Muscle weakness (generalized) - Plan: PT plan of care cert/re-cert  Bilateral low back pain without sciatica - Plan: PT plan of care cert/re-cert  Other symptoms and signs involving the musculoskeletal system - Plan: PT plan of care cert/re-cert     Problem List Patient Active Problem List   Diagnosis Date Noted  . Menopausal symptoms 04/09/2015  . Dysphagia, pharyngoesophageal phase   . Dysphagia 08/21/2014  . Constipation 08/21/2014  . IRREGULAR MENSTRUAL CYCLE 11/15/2007  . VERTIGO, CHRONIC 11/15/2007  . HEADACHE 11/15/2007  . SYNCOPE, HX OF 11/15/2007    2:26 PM,10/14/2015 SElly ModenaPT, DPT [facility]Out[facility] [id number] [address]Phone: [phone]  Fax:  [phone] Name: Sandra JORSTADMRN: [id number] of Birth: 711/14/1962

## 2015-10-16 ENCOUNTER — Encounter (HOSPITAL_COMMUNITY): Payer: BLUE CROSS/BLUE SHIELD

## 2015-10-17 ENCOUNTER — Ambulatory Visit (HOSPITAL_COMMUNITY): Payer: BLUE CROSS/BLUE SHIELD

## 2015-10-21 ENCOUNTER — Ambulatory Visit (HOSPITAL_COMMUNITY): Payer: BLUE CROSS/BLUE SHIELD

## 2015-10-21 DIAGNOSIS — R29898 Other symptoms and signs involving the musculoskeletal system: Secondary | ICD-10-CM

## 2015-10-21 DIAGNOSIS — M545 Low back pain, unspecified: Secondary | ICD-10-CM

## 2015-10-21 DIAGNOSIS — M6281 Muscle weakness (generalized): Secondary | ICD-10-CM

## 2015-10-21 NOTE — Therapy (Signed)
[place], [place], [address] Phone: [phone]   Fax:  [phone]  Physical Therapy Treatment  Patient Details  Name: Sandra Davies MRN: [id number] Date of Birth: [birth year] Referring Provider: Sanjuana Kava, MD  Encounter Date: 10/21/2015    Past Medical History  Diagnosis Date  . Dysphagia 2015  . Constipation - functional     FOR A LONG TIME  . Arthritis   . GERD (gastroesophageal reflux disease)     Past Surgical History  Procedure Laterality Date  . Knee surgery    . Esophagogastroduodenoscopy N/A 09/03/2014    ONG:[id number] gastritis/dysphagia due to distal esophagel stricture  . Esophageal dilation N/A 09/03/2014    Procedure: ESOPHAGEAL DILATION;  Surgeon: Danie Binder, MD;  [facility];  Service: Endoscopy;  Laterality: N/A;  . Esophagogastroduodenoscopy N/A 09/26/2014    KGM:WNUU non-erosive gastritis/stricture at the gastroesphageal    There were no vitals filed for this visit.      Subjective Assessment - 10/21/15 0949    Subjective Pt stated she feels like she over did it over weekend and has increased pain Rt hip following pain scale 5-6/10   Pertinent History Rt knee surgery    Patient Stated Goals less pain; get back to work    Currently in Pain? Yes   Pain Score 5   LB and Rt hip   Pain Location Hip   Pain Descriptors / Indicators Tender   Pain Type Chronic pain   Pain Onset More than a month ago   Pain Frequency Intermittent   Aggravating Factors  being up and doing alot of extra work   Pain Relieving Factors exercises, pain meds   Effect of Pain on Daily Activities limiting full participation in activity             [facility] Adult PT Treatment/Exercise - 10/21/15 0001    Lumbar Exercises: Stretches   Quad Stretch 3 reps;30 seconds   Quad Stretch Limitations Thomas st supine with strap    ITB Stretch 3 reps;30 seconds   ITB Stretch Limitations standing    Lumbar Exercises: Standing   Other Standing Lumbar Exercises Standing core sets 10x 3"   Lumbar Exercises: Quadruped   Single Arm Raise Right;Left;10 reps;3 seconds   Straight Leg Raise 10 reps;3 seconds   Opposite Arm/Leg Raise Right arm/Left leg;Left arm/Right leg;5 reps;3 seconds   Manual Therapy   Manual Therapy Soft tissue mobilization   Manual therapy comments completed separately from ther ex   Soft tissue mobilization TrP release R piriformis; rolling catch and release along ITB and STM to glute med and TFL            PT Short Term Goals - 10/14/15 0941    PT SHORT TERM GOAL #1   Title Pt to improve her hip and low back ROM to be able to pick items off the floor with ease   Time 3   Period Weeks   Status New   PT SHORT TERM GOAL #2   Title Pt core strength to improve one grade to allow pt to complete an hour of housework without having increased pain.    Baseline Pt is sweeping and light house duties   Time 3   Period Weeks   Status Partially Met   PT SHORT TERM GOAL #3   Title Pt to understand and to be using core stabiization with transitional motion to allow sit to stand and  stand to sit to occur without any increased pain    Baseline decreased pain with sit/stand   Time 3   Period Weeks   Status New   PT SHORT TERM GOAL #4   Title Pt to be able to demonstrate correct body mechanics for washing face, getting items out of a low cabinet in order to decrease stress on her low back to allow pain to decrease to no greater than a 3/10    Time 3   Period Weeks   Status New           PT Long Term Goals - 09/18/15 1623    PT LONG TERM GOAL #1   Title Pt core sterngth to improve to a 4+/5 to allow pt to carry in groceries without having increased pain    Time 6   Period Weeks   PT LONG TERM GOAL #2   Title Pt to be able to demonsrate proper body mechanics for lifting 12 to waist as well as to operate a Scientist, water quality in order to feel confident in returning to work     Time 6   Period Weeks   Status New   PT LONG TERM GOAL #3   Title Pt LE strength to be 5/5 to allow pt to use proper body mechanics with higher level activity to allow pt pain to decrease to no greater than a 1/10 to assist in returning to work.    Time 6   Period Weeks   Status New   PT LONG TERM GOAL #4   Title Pt to state that she is able to sleep on her back with comfort again    Time 6   Period Weeks   Status New               Plan - 10/21/15 1301    Clinical Impression Statement Continued session focus on improving core strengthening and LE mobilty.  Progressed core strengthening in quadruped position exercises with opp arm/leg with level on back to increase awareness with core stabilization with min verbal cueing.  Continued hip flexor stretches to improve pelvic position and began standing core stabilization exercises with verbal and tactile cueing for proper activation.  Ended session with manual soft tissue mobilization and trigger point release to Rt hip for pain relief.  End of session pt reports pain free with improve gait mechancs and posture.     Rehab Potential Good   PT Frequency 2x / week   PT Duration 4 weeks   PT Treatment/Interventions Ultrasound;Cryotherapy;Electrical Stimulation;Iontophoresis 26m/ml Dexamethasone;Moist Heat;Traction;Gait training;Therapeutic exercise;Balance training;Manual techniques;Patient/family education   PT Next Visit Plan  core stabilization in supine, LE stretches, functional squats with focus on core bracing, and hip strengthening. Progress core stabilization from supine to quadruped or seated position; manual treatment to address soft tissue restrictions as needed for pain control      Patient will benefit from skilled therapeutic intervention in order to improve the following deficits and impairments:  Decreased activity tolerance, Decreased balance, Decreased range of motion, Decreased strength, Difficulty walking, Impaired  flexibility, Pain  Visit Diagnosis: Muscle weakness (generalized)  Bilateral low back pain without sciatica  Other symptoms and signs involving the musculoskeletal system     Problem List Patient Active Problem List   Diagnosis Date Noted  . Menopausal symptoms 04/09/2015  . Dysphagia, pharyngoesophageal phase   . Dysphagia 08/21/2014  . Constipation 08/21/2014  . IRREGULAR MENSTRUAL CYCLE 11/15/2007  . VERTIGO, CHRONIC 11/15/2007  .  HEADACHE 11/15/2007  . SYNCOPE, HX OF 11/15/2007   Ihor Austin, LPTA; CBIS [phone]  Aldona Lento 10/21/2015, 1:10 PM  [place] [address] [place], [place], [address] Phone: [phone]   Fax:  [phone]  Name: Sandra Davies MRN: [id number] Date of Birth: [birth year]

## 2015-10-23 ENCOUNTER — Ambulatory Visit (INDEPENDENT_AMBULATORY_CARE_PROVIDER_SITE_OTHER): Payer: BLUE CROSS/BLUE SHIELD | Admitting: Orthopaedic Surgery

## 2015-10-23 ENCOUNTER — Telehealth (HOSPITAL_COMMUNITY): Payer: Self-pay | Admitting: Physical Therapy

## 2015-10-23 ENCOUNTER — Encounter: Payer: Self-pay | Admitting: Orthopaedic Surgery

## 2015-10-23 ENCOUNTER — Ambulatory Visit (HOSPITAL_COMMUNITY): Payer: BLUE CROSS/BLUE SHIELD | Admitting: Physical Therapy

## 2015-10-23 VITALS — BP 141/92 | HR 95 | Temp 97.7°F | Ht 67.5 in | Wt 165.0 lb

## 2015-10-23 DIAGNOSIS — M545 Low back pain, unspecified: Secondary | ICD-10-CM

## 2015-10-23 NOTE — Progress Notes (Signed)
Patient Sandra Davies [name], female DOB:[birth year], 55 y.o. WJX:[id number]  Chief Complaint  Patient presents with  . Follow-up    back pain    HPI  Sandra Davies is a 55 y.o. female who has lower back pain.  She is improving.  She is going to PT and has more motion now.  She has no paresthesias.  She has no bowel or bladder problems.  She has no new trauma.  She is doing her exercises at home as well.  I have reviewed her PT notes.   HPI  Body mass index is 25.45 kg/(m^2).  Review of Systems  Constitutional:       Patient does not have Diabetes Mellitus. Patient does not have hypertension. Patient does not have COPD or shortness of breath. Patient does not have BMI > 35. Patient does not have current smoking history.   HENT: Negative for congestion.   Respiratory: Negative for cough and shortness of breath.   Cardiovascular: Negative for chest pain.  Gastrointestinal: Positive for constipation.       GERD  Endocrine: Positive for cold intolerance.  Musculoskeletal: Positive for back pain and joint swelling (right knee).  All other systems reviewed and are negative.   Past Medical History  Diagnosis Date  . Dysphagia 2015  . Constipation - functional     FOR A LONG TIME  . Arthritis   . GERD (gastroesophageal reflux disease)     Past Surgical History  Procedure Laterality Date  . Knee surgery    . Esophagogastroduodenoscopy N/A 09/03/2014    OZH:[id number] gastritis/dysphagia due to distal esophagel stricture  . Esophageal dilation N/A 09/03/2014    Procedure: ESOPHAGEAL DILATION;  Surgeon: West Bali, MD;  [facility];  Service: Endoscopy;  Laterality: N/A;  . Esophagogastroduodenoscopy N/A 09/26/2014    NGE:XBMW non-erosive gastritis/stricture at the gastroesphageal    Family History  Problem Relation Age of Onset  . Breast cancer Mother     DECEASED  . Colon polyps Neg Hx   . Colon cancer Neg Hx   . Stomach cancer Neg Hx   . Alzheimer's  disease Father   . Heart disease Brother   . Lupus Sister     Social History Social History  Substance Use Topics  . Smoking status: Never Smoker   . Smokeless tobacco: Never Used  . Alcohol Use: 0.0 oz/week    0 Standard drinks or equivalent per week     Comment: very occasional    No Known Allergies  Current Outpatient Prescriptions  Medication Sig Dispense Refill  . cyclobenzaprine (FLEXERIL) 10 MG tablet Take 1 tablet (10 mg total) by mouth 3 (three) times daily as needed for muscle spasms. 90 tablet 3  . diclofenac (VOLTAREN) 75 MG EC tablet Take 1 tablet (75 mg total) by mouth 2 (two) times daily with a meal. 60 tablet 2  . diphenhydrAMINE (BENADRYL) 25 MG tablet Take 25 mg by mouth every 6 (six) hours as needed for allergies. Reported on 08/21/2015    . estradiol (ALORA) 0.1 MG/24HR patch Place 1 patch (0.1 mg total) onto the skin 2 (two) times a week. 8 patch 12  . HYDROcodone-acetaminophen (NORCO) 7.5-325 MG tablet Take 1 tablet by mouth every 4 (four) hours as needed for moderate pain (Must last 30 days.  Do not drive or operate machinery while taking this medicine.). 120 tablet 0  . omeprazole (PRILOSEC) 20 MG capsule TAKE 1 CAPSULE BY MOUTH 30 MINUTES PRIOR TO BREAKFAST AND  SUPPER 60 capsule 11  . progesterone (PROMETRIUM) 200 MG capsule Take 1 capsule (200 mg total) by mouth daily. Take x 14 days each month (first 14 days) 42 capsule 3   No current facility-administered medications for this visit.     Physical Exam  Blood pressure 141/92, pulse 95, temperature 97.7 F (36.5 C), height 5' 7.5" (1.715 m), weight 165 lb (74.844 kg), last menstrual period 07/02/2013.  Constitutional: overall normal hygiene, normal nutrition, well developed, normal grooming, normal body habitus. Assistive device:none  Musculoskeletal: gait and station Limp none, muscle tone and strength are normal, no tremors or atrophy is present.  .  Neurological: coordination overall normal.  Deep  tendon reflex/nerve stretch intact.  Sensation normal.  Cranial nerves II-XII intact.   Skin:   normal overall no scars, lesions, ulcers or rashes. No psoriasis.  Psychiatric: Alert and oriented x 3.  Recent memory intact, remote memory unclear.  Normal mood and affect. Well groomed.  Good eye contact.  Cardiovascular: overall no swelling, no varicosities, no edema bilaterally, normal temperatures of the legs and arms, no clubbing, cyanosis and good capillary refill.  Lymphatic: palpation is normal.  Spine/Pelvis examination:  Inspection:  Overall, sacoiliac joint benign and hips nontender; without crepitus or defects.   Thoracic spine inspection: Alignment normal without kyphosis present   Lumbar spine inspection:  Alignment  with normal lumbar lordosis, without scoliosis apparent.   Thoracic spine palpation:  without tenderness of spinal processes   Lumbar spine palpation: with tenderness of lumbar area; without tightness of lumbar muscles    Range of Motion:   Lumbar flexion, forward flexion is 40  without pain or tenderness    Lumbar extension is normal  without pain or tenderness   Left lateral bend is Normal  without pain or tenderness   Right lateral bend is Normal without pain or tenderness   Straight leg raising is Normal   Strength & tone: Normal   Stability overall normal stability   The patient has been educated about the nature of the problem(s) and counseled on treatment options.  The patient appeared to understand what I have discussed and is in agreement with it.  Encounter Diagnosis  Name Primary?  . Midline low back pain without sciatica Yes    PLAN Call if any problems.  Precautions discussed.  Continue current medications.   Return to clinic 2 weeks   Continue PT.  Stay out of work.

## 2015-10-23 NOTE — Patient Instructions (Signed)
Out of work. Continue PT 

## 2015-10-23 NOTE — Telephone Encounter (Signed)
Spoke with pt who says she had an MD apt that ran over. Says she was going to call and would like to come tomorrow if possible. Therapist reminded her of her apt next tues and will let scheduling know to contact her with any availability tomorrow.   11:23 AM,10/23/2015 Marylyn IshiharaSara Kiser PT, DPT [facility] [phone]

## 2015-10-28 ENCOUNTER — Ambulatory Visit (HOSPITAL_COMMUNITY): Payer: BLUE CROSS/BLUE SHIELD | Admitting: Physical Therapy

## 2015-10-28 DIAGNOSIS — R29898 Other symptoms and signs involving the musculoskeletal system: Secondary | ICD-10-CM | POA: Diagnosis not present

## 2015-10-28 DIAGNOSIS — M545 Low back pain, unspecified: Secondary | ICD-10-CM

## 2015-10-28 DIAGNOSIS — M6281 Muscle weakness (generalized): Secondary | ICD-10-CM

## 2015-10-28 NOTE — Therapy (Signed)
[facility] [address] [place], [place], [address] Phone: [phone]   Fax:  [phone]  Physical Therapy Treatment (Re-Assess)  Patient Details  Name: Sandra Davies MRN: [id number] Date of Birth: April 01, 1961 Referring Provider: Sanjuana Kava   Encounter Date: 10/28/2015      PT End of Session - 10/28/15 1402    Visit Number 7   Number of Visits 15   Date for PT Re-Evaluation 11/25/15   Authorization Type BCBS   PT Start Time 1302   PT Stop Time 1345   PT Time Calculation (min) 43 min   Activity Tolerance Patient tolerated treatment well   Behavior During Therapy [facility] for tasks assessed/performed      Past Medical History  Diagnosis Date  . Dysphagia 2015  . Constipation - functional     FOR A LONG TIME  . Arthritis   . GERD (gastroesophageal reflux disease)     Past Surgical History  Procedure Laterality Date  . Knee surgery    . Esophagogastroduodenoscopy N/A 09/03/2014    SAY:[id number] gastritis/dysphagia due to distal esophagel stricture  . Esophageal dilation N/A 09/03/2014    Procedure: ESOPHAGEAL DILATION;  Surgeon: Danie Binder, MD;  [facility];  Service: Endoscopy;  Laterality: N/A;  . Esophagogastroduodenoscopy N/A 09/26/2014    NAT:FTDD non-erosive gastritis/stricture at the gastroesphageal    There were no vitals filed for this visit.      Subjective Assessment - 10/28/15 1304    Subjective Patient reports that she is definitely getting better; housework is better and easier for her to perform. Wants to go back to work, but has not talked to MD about this eyt. Cannot sit for very long yet, but thinks taht some of this soreness might be rleated to back and hip. 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    Pertinent History Rt knee surgery    How long can you sit comfortably? 4/25- uses low  furniture at home, 1-2 hours max    How long can you stand comfortably? 4/25- 60-90 minutes    How long can you walk comfortably? 4/25- 60 minutes    Patient Stated Goals less pain; get back to work    Currently in Pain? Yes   Pain Score 6    Pain Location Other (Comment)  back and right hip    Pain Orientation Right;Lower   Pain Descriptors / Indicators Aching  numbness/tingling in fingertips    Pain Type Chronic pain   Pain Radiating Towards none    Pain Onset More than a month ago   Pain Frequency Intermittent   Aggravating Factors  doing too much    Pain Relieving Factors exercises, sitting down, hot shower    Effect of Pain on Daily Activities limits full participatin in activity             [facility] PT Assessment - 10/28/15 0001    Assessment   Medical Diagnosis Lumbar strain   Referring Provider Sanjuana Kava    Onset Date/Surgical Date 07/23/15   Next MD Visit 11/04/15   Precautions   Precautions None   Restrictions   Weight Bearing Restrictions No   Balance Screen   Has the patient fallen in the past 6 months No   Has the patient had a decrease in activity level because of a fear  of falling?  No   Is the patient reluctant to leave their home because of a fear of falling?  No   Home Environment   Living Environment Private residence   Home Access Level entry   Home Layout One level   Prior Function   Level of [place] Full time employment   Vocation Requirements Uses a handjack; pulls boxes out, on concrete    Observation/Other Assessments   Focus on Therapeutic Outcomes (FOTO)  50   AROM   Lumbar Flexion 2 inches above floor with pull along low back   Lumbar Extension approx 15 degrees    Strength   Right Hip Flexion 4+/5   Right Hip Extension 4/5   Right Hip ABduction 5/5   Left Hip Flexion 4+/5   Left Hip Extension 4/5   Left Hip ABduction 4+/5   Right Knee Flexion 4+/5   Right Knee Extension 4+/5   Left Knee Flexion 5/5    Left Knee Extension 5/5   Right Ankle Dorsiflexion 5/5   Left Ankle Dorsiflexion 5/5                     [facility] Adult PT Treatment/Exercise - 10/28/15 0001    Lumbar Exercises: Aerobic   Stationary Bike Nustep, UE and LE, level 3 hills 2 x 8 minutes  (performed while PT designed advanced HEP)   Lumbar Exercises: Standing   Other Standing Lumbar Exercises sit to stand, no UEs 1x10 slow pace    Other Standing Lumbar Exercises hip abduction and extension with blue TB 1x10   Lumbar Exercises: Seated   Sit to Stand Limitations opposite UE/LE flexion with core set 1x10                PT Education - 10/28/15 1402    Education provided Yes   Education Details progress with skilled PT services, plan of care moving forward, HEP to do in locker room     Person(s) Educated Patient   Methods Explanation   Comprehension Verbalized understanding          PT Short Term Goals - 10/28/15 1318    PT SHORT TERM GOAL #1   Title Pt to improve her hip and low back ROM to be able to pick items off the floor with ease   Baseline 4/25- ROM improving but having trouble with lifting form    Time 3   Period Weeks   Status On-going   PT SHORT TERM GOAL #2   Title Pt core strength to improve one grade to allow pt to complete an hour of housework without having increased pain.    Baseline 4/25- patient able to perform    Time 3   Period Weeks   Status Achieved   PT SHORT TERM GOAL #3   Title Pt to understand and to be using core stabiization with transitional motion to allow sit to stand and stand to sit to occur without any increased pain    Baseline 4/25- education provided today regarding this    Time 3   Period Weeks   Status On-going   PT SHORT TERM GOAL #4   Title Pt to be able to demonstrate correct body mechanics for washing face, getting items out of a low cabinet in order to decrease stress on her low back to allow pain to decrease to no greater than a 3/10    Time 3    Period Weeks  Status On-going           PT Long Term Goals - 10/28/15 1320    PT LONG TERM GOAL #1   Title Pt core sterngth to improve to a 4+/5 to allow pt to carry in groceries without having increased pain    Time 6   Period Weeks   Status Partially Met   PT LONG TERM GOAL #2   Title Pt to be able to demonsrate proper body mechanics for lifting 12 to waist as well as to operate a Scientist, water quality in order to feel confident in returning to work    Time 6   Period Weeks   Status On-going   PT LONG TERM GOAL #3   Title Pt LE strength to be 5/5 to allow pt to use proper body mechanics with higher level activity to allow pt pain to decrease to no greater than a 1/10 to assist in returning to work.    Time 6   Period Weeks   Status On-going   PT LONG TERM GOAL #4   Title Pt to state that she is able to sleep on her back with comfort again    Baseline 4/25- reports she never could sleep on her back, not doing it right now    Time 6   Period Weeks   Status Deferred               Plan - 10/28/15 1403    Clinical Impression Statement Re-assessment performed today, as it was pateint's last scheduled session. Patient shows improvement in lumbar ROM, however does continue to display mild strengthe deficit and poor mechanics for lifting and picking items up- per patient, "I really have to focus on this to do it correctly". Patient also states taht she is concerned because she is very much on the go at work and is likely to at some point get herself into a position of poor mechanics. Assigned HEP that she will bea ble to perform in locker room at [facility] per patient's request, and at this  recommend ongoing skilled PT services in order to continue addressing functional posture, functional strength, and lifting mechancis.    Rehab Potential Good   PT Frequency 2x / week   PT Duration 4 weeks   PT Treatment/Interventions Ultrasound;Cryotherapy;Electrical Stimulation;Iontophoresis 67m/ml  Dexamethasone;Moist Heat;Traction;Gait training;Therapeutic exercise;Balance training;Manual techniques;Patient/family education;ADLs/Self Care Home Management;Therapeutic activities   PT Next Visit Plan lifting mechanics progressing to dynamic training along "assembly line"; proximal/core strength; posture traiing    Consulted and Agree with Plan of Care Patient      Patient will benefit from skilled therapeutic intervention in order to improve the following deficits and impairments:  Decreased activity tolerance, Decreased balance, Decreased range of motion, Decreased strength, Difficulty walking, Impaired flexibility, Pain  Visit Diagnosis: Muscle weakness (generalized) - Plan: PT plan of care cert/re-cert  Bilateral low back pain without sciatica - Plan: PT plan of care cert/re-cert  Other symptoms and signs involving the musculoskeletal system - Plan: PT plan of care cert/re-cert     Problem List Patient Active Problem List   Diagnosis Date Noted  . Menopausal symptoms 04/09/2015  . Dysphagia, pharyngoesophageal phase   . Dysphagia 08/21/2014  . Constipation 08/21/2014  . IRREGULAR MENSTRUAL CYCLE 11/15/2007  . VERTIGO, CHRONIC 11/15/2007  . HEADACHE 11/15/2007  . SYNCOPE, HX OF 11/15/2007    KDeniece ReePT, DPT [address] [place] [address]Phone:  [phone]   Fax:  [phone]  Name: Sandra Davies MRN: [id number] Date of Birth: [birth year]

## 2015-10-28 NOTE — Patient Instructions (Signed)
Knee Extension: Sit to Stand (Eccentric)    Stand close to chair. Slowly lower self to seated position. __10_ reps per set, __2_ sets per day, _5__ days per week. Progress to stopping midway before lowering to chair. Progress to barely touching chair.  Copyright  VHI. All rights reserved.     ELASTIC BAND HIP ABDUCTION  While standing with an elastic band attached to your leg, pull an elastic band out to the side.  Repeat 10 times each side, twice a day.    ELASTIC BAND - HIP EXTENSION  While standing with an elastic band attached to your ankle, draw your leg back behind you.   Keep your knee straight the entire time.  Repeat 10 times each, twice a day.    SEATED ALTERNATE ARM AND LEG   While sitting on a chair or bench, raise up an arm and opposite leg. Do not let your pelvis or spine move while performing- make sure you are squeezing your core tight!  Repeat 10 times each side, twice a day.

## 2015-10-30 ENCOUNTER — Ambulatory Visit (HOSPITAL_COMMUNITY): Payer: BLUE CROSS/BLUE SHIELD | Admitting: Physical Therapy

## 2015-10-30 DIAGNOSIS — M545 Low back pain, unspecified: Secondary | ICD-10-CM

## 2015-10-30 DIAGNOSIS — R29898 Other symptoms and signs involving the musculoskeletal system: Secondary | ICD-10-CM | POA: Diagnosis not present

## 2015-10-30 DIAGNOSIS — M6281 Muscle weakness (generalized): Secondary | ICD-10-CM

## 2015-10-30 NOTE — Therapy (Signed)
[facility] [address] [place], [place], [address] Phone: [phone]   Fax:  [phone]  Physical Therapy Treatment  Patient Details  Name: HARLYN RATHMANN MRN: [id number] Date of Birth: [birth year] Referring Provider: Darreld Mclean   Encounter Date: 10/30/2015      PT End of Session - 10/30/15 0945    Visit Number 8   Number of Visits 15   Date for PT Re-Evaluation 11/25/15   Authorization Type BCBS   PT Start Time 0903   PT Stop Time 0944   PT Time Calculation (min) 41 min   Activity Tolerance Patient tolerated treatment well   Behavior During Therapy [facility] for tasks assessed/performed      Past Medical History  Diagnosis Date  . Dysphagia 2015  . Constipation - functional     FOR A LONG TIME  . Arthritis   . GERD (gastroesophageal reflux disease)     Past Surgical History  Procedure Laterality Date  . Knee surgery    . Esophagogastroduodenoscopy N/A 09/03/2014    JTX:[id number] gastritis/dysphagia due to distal esophagel stricture  . Esophageal dilation N/A 09/03/2014    Procedure: ESOPHAGEAL DILATION;  Surgeon: West Bali, MD;  [facility];  Service: Endoscopy;  Laterality: N/A;  . Esophagogastroduodenoscopy N/A 09/26/2014    GFN:HBLJ non-erosive gastritis/stricture at the gastroesphageal    There were no vitals filed for this visit.      Subjective Assessment - 10/30/15 0905    Subjective Pt states she is doing "so, so". She is doing her exercises regularly. Some minor pain currently in the Rt hip and back.   Pertinent History Rt knee surgery    Patient Stated Goals less pain; get back to work    Currently in Pain? Yes   Pain Score 4    Pain Location --  Rt back and hip                         [facility] Adult PT Treatment/Exercise - 10/30/15 0001    Exercises   Exercises Other Exercises;Knee/Hip   Other Exercises  Hip flexor stretch in half kneel 3x30 sec each; Lifting box from  floor with focus on body mechanics, addition of 10# to box 2x20 reps  noting mod cues needed for correct lifting technique   Lumbar Exercises: Standing   Wall Slides 10 reps  x2 sets,    Wall Slides Limitations green TB around knees to prevent valgus   Lumbar Exercises: Supine   Other Supine Lumbar Exercises bent knee alt leg lower x20 reps   Knee/Hip Exercises: Machines for Strengthening   Total Gym Leg Press L30, 2x8 reps RLE   Knee/Hip Exercises: Standing   Forward Step Up Right;2 sets;15 reps;Hand Hold: 1;Step Height: 6"   Manual Therapy   Manual Therapy Soft tissue mobilization   Manual therapy comments completed separately from ther ex   Soft tissue mobilization Rolling Rt ITB/quad x4 min                PT Education - 10/30/15 0945    Education provided Yes   Education Details discussed correct body ergonomics with lifting; updated HEP   Person(s) Educated Patient   Methods Explanation;Demonstration   Comprehension Verbalized understanding;Returned demonstration;Need further instruction          PT Short Term Goals - 10/28/15 1318    PT SHORT TERM GOAL #1   Title Pt to improve her  hip and low back ROM to be able to pick items off the floor with ease   Baseline 4/25- ROM improving but having trouble with lifting form    Time 3   Period Weeks   Status On-going   PT SHORT TERM GOAL #2   Title Pt core strength to improve one grade to allow pt to complete an hour of housework without having increased pain.    Baseline 4/25- patient able to perform    Time 3   Period Weeks   Status Achieved   PT SHORT TERM GOAL #3   Title Pt to understand and to be using core stabiization with transitional motion to allow sit to stand and stand to sit to occur without any increased pain    Baseline 4/25- education provided today regarding this    Time 3   Period Weeks   Status On-going   PT SHORT TERM GOAL #4   Title Pt to be able to demonstrate correct body mechanics for  washing face, getting items out of a low cabinet in order to decrease stress on her low back to allow pain to decrease to no greater than a 3/10    Time 3   Period Weeks   Status On-going           PT Long Term Goals - 10/28/15 1320    PT LONG TERM GOAL #1   Title Pt core sterngth to improve to a 4+/5 to allow pt to carry in groceries without having increased pain    Time 6   Period Weeks   Status Partially Met   PT LONG TERM GOAL #2   Title Pt to be able to demonsrate proper body mechanics for lifting 12 to waist as well as to operate a Scientist, water quality in order to feel confident in returning to work    Time 6   Period Weeks   Status On-going   PT LONG TERM GOAL #3   Title Pt LE strength to be 5/5 to allow pt to use proper body mechanics with higher level activity to allow pt pain to decrease to no greater than a 1/10 to assist in returning to work.    Time 6   Period Weeks   Status On-going   PT LONG TERM GOAL #4   Title Pt to state that she is able to sleep on her back with comfort again    Baseline 4/25- reports she never could sleep on her back, not doing it right now    Time 6   Period Weeks   Status Deferred               Plan - 10/30/15 0945    Clinical Impression Statement Today's session focused on therex to improve Rt LE and abdominal strength. Also addressed proper technique with lifting for carryover to her job. Pt needing moderate verbal cues to perform correctly with increased tendency of trunk flexion during activity. Updated HEP with pt verbalizing understanding. Will continue with current POC.   Rehab Potential Good   PT Frequency 2x / week   PT Duration 4 weeks   PT Treatment/Interventions Ultrasound;Cryotherapy;Electrical Stimulation;Iontophoresis 80m/ml Dexamethasone;Moist Heat;Traction;Gait training;Therapeutic exercise;Balance training;Manual techniques;Patient/family education;ADLs/Self Care Home Management;Therapeutic activities   PT Next Visit Plan  lifting mechanics progressing to dynamic training along "assembly line"; proximal/core strength; posture traiing    PT Home Exercise Plan updated with half kneel hip flexor stretch and supine bent knee alt leg lower   Consulted  and Agree with Plan of Care Patient      Patient will benefit from skilled therapeutic intervention in order to improve the following deficits and impairments:  Decreased activity tolerance, Decreased balance, Decreased range of motion, Decreased strength, Difficulty walking, Impaired flexibility, Pain  Visit Diagnosis: Muscle weakness (generalized)  Bilateral low back pain without sciatica  Other symptoms and signs involving the musculoskeletal system     Problem List Patient Active Problem List   Diagnosis Date Noted  . Menopausal symptoms 04/09/2015  . Dysphagia, pharyngoesophageal phase   . Dysphagia 08/21/2014  . Constipation 08/21/2014  . IRREGULAR MENSTRUAL CYCLE 11/15/2007  . VERTIGO, CHRONIC 11/15/2007  . HEADACHE 11/15/2007  . SYNCOPE, HX OF 11/15/2007    10:35 AM,10/30/2015 Elly Modena PT, DPT [facility] [address] [place], [place], [address] Phone: [phone]   Fax:  [phone]  Name: YANIRIS BRADDOCK MRN: [id number] Date of Birth: 1961/02/22

## 2015-10-31 ENCOUNTER — Encounter (HOSPITAL_COMMUNITY): Payer: BLUE CROSS/BLUE SHIELD

## 2015-11-04 ENCOUNTER — Encounter (HOSPITAL_COMMUNITY): Payer: BLUE CROSS/BLUE SHIELD

## 2015-11-04 ENCOUNTER — Encounter (HOSPITAL_COMMUNITY): Payer: BLUE CROSS/BLUE SHIELD | Admitting: Physical Therapy

## 2015-11-06 ENCOUNTER — Encounter: Payer: Self-pay | Admitting: Orthopaedic Surgery

## 2015-11-06 ENCOUNTER — Ambulatory Visit (INDEPENDENT_AMBULATORY_CARE_PROVIDER_SITE_OTHER): Payer: BLUE CROSS/BLUE SHIELD | Admitting: Orthopaedic Surgery

## 2015-11-06 ENCOUNTER — Encounter (HOSPITAL_COMMUNITY): Payer: BLUE CROSS/BLUE SHIELD

## 2015-11-06 VITALS — BP 146/93 | HR 102 | Temp 97.5°F | Ht 67.5 in | Wt 158.0 lb

## 2015-11-06 DIAGNOSIS — M545 Low back pain, unspecified: Secondary | ICD-10-CM

## 2015-11-06 MED ORDER — HYDROCODONE-ACETAMINOPHEN 7.5-325 MG PO TABS: 1.0000 | ORAL_TABLET | ORAL | Status: DC | PRN

## 2015-11-06 NOTE — Progress Notes (Signed)
Patient ZO:[id number] Sandra Davies, female DOB:31-Mar-1961, 55 y.o. WJX:[id number]  Chief Complaint  Patient presents with  . Follow-up    back    HPI  Sandra Davies is a 55 y.o. female who has lower back pain with no sciatica.  She is improved. She is going to PT and it really helps.  She has no bowel or bladder problem.  She is moving better. She is taking her medicine.  HPI  Body mass index is 24.37 kg/(m^2).  Review of Systems  Constitutional:       Patient does not have Diabetes Mellitus. Patient does not have hypertension. Patient does not have COPD or shortness of breath. Patient does not have BMI > 35. Patient does not have current smoking history.   HENT: Negative for congestion.   Respiratory: Negative for cough and shortness of breath.   Cardiovascular: Negative for chest pain.  Gastrointestinal: Positive for constipation.       GERD  Endocrine: Positive for cold intolerance.  Musculoskeletal: Positive for back pain and joint swelling (right knee).  All other systems reviewed and are negative.   Past Medical History  Diagnosis Date  . Dysphagia 2015  . Constipation - functional     FOR A LONG TIME  . Arthritis   . GERD (gastroesophageal reflux disease)     Past Surgical History  Procedure Laterality Date  . Knee surgery    . Esophagogastroduodenoscopy N/A 09/03/2014    OZH:[id number] gastritis/dysphagia due to distal esophagel stricture  . Esophageal dilation N/A 09/03/2014    Procedure: ESOPHAGEAL DILATION;  Surgeon: West Bali, MD;  [facility];  Service: Endoscopy;  Laterality: N/A;  . Esophagogastroduodenoscopy N/A 09/26/2014    NGE:XBMW non-erosive gastritis/stricture at the gastroesphageal    Family History  Problem Relation Age of Onset  . Breast cancer Mother     DECEASED  . Colon polyps Neg Hx   . Colon cancer Neg Hx   . Stomach cancer Neg Hx   . Alzheimer's disease Father   . Heart disease Brother   . Lupus Sister     Social  History Social History  Substance Use Topics  . Smoking status: Never Smoker   . Smokeless tobacco: Never Used  . Alcohol Use: 0.0 oz/week    0 Standard drinks or equivalent per week     Comment: very occasional    No Known Allergies  Current Outpatient Prescriptions  Medication Sig Dispense Refill  . cyclobenzaprine (FLEXERIL) 10 MG tablet Take 1 tablet (10 mg total) by mouth 3 (three) times daily as needed for muscle spasms. 90 tablet 3  . diclofenac (VOLTAREN) 75 MG EC tablet Take 1 tablet (75 mg total) by mouth 2 (two) times daily with a meal. 60 tablet 2  . diphenhydrAMINE (BENADRYL) 25 MG tablet Take 25 mg by mouth every 6 (six) hours as needed for allergies. Reported on 08/21/2015    . estradiol (ALORA) 0.1 MG/24HR patch Place 1 patch (0.1 mg total) onto the skin 2 (two) times a week. 8 patch 12  . HYDROcodone-acetaminophen (NORCO) 7.5-325 MG tablet Take 1 tablet by mouth every 4 (four) hours as needed for moderate pain (Must last 30 days.  Do not drive or operate machinery while taking this medicine.). 120 tablet 0  . omeprazole (PRILOSEC) 20 MG capsule TAKE 1 CAPSULE BY MOUTH 30 MINUTES PRIOR TO BREAKFAST AND SUPPER 60 capsule 11  . progesterone (PROMETRIUM) 200 MG capsule Take 1 capsule (200 mg total) by mouth  daily. Take x 14 days each month (first 14 days) 42 capsule 3   No current facility-administered medications for this visit.     Physical Exam  Blood pressure 146/93, pulse 102, temperature 97.5 F (36.4 C), height 5' 7.5" (1.715 m), weight 158 lb (71.668 kg), last menstrual period 07/02/2013.  Constitutional: overall normal hygiene, normal nutrition, well developed, normal grooming, normal body habitus. Assistive device:none  Musculoskeletal: gait and station Limp none, muscle tone and strength are normal, no tremors or atrophy is present.  .  Neurological: coordination overall normal.  Deep tendon reflex/nerve stretch intact.  Sensation normal.  Cranial nerves  II-XII intact.   Skin:   normal overall no scars, lesions, ulcers or rashes. No psoriasis.  Psychiatric: Alert and oriented x 3.  Recent memory intact, remote memory unclear.  Normal mood and affect. Well groomed.  Good eye contact.  Cardiovascular: overall no swelling, no varicosities, no edema bilaterally, normal temperatures of the legs and arms, no clubbing, cyanosis and good capillary refill.  Lymphatic: palpation is normal.  Spine/Pelvis examination:  Inspection:  Overall, sacoiliac joint benign and hips nontender; without crepitus or defects.   Thoracic spine inspection: Alignment normal without kyphosis present   Lumbar spine inspection:  Alignment  with normal lumbar lordosis, without scoliosis apparent.   Thoracic spine palpation:  without tenderness of spinal processes   Lumbar spine palpation: with tenderness of lumbar area; without tightness of lumbar muscles    Range of Motion:   Lumbar flexion, forward flexion is full  without pain or tenderness    Lumbar extension is full  without pain or tenderness   Left lateral bend is Normal  without pain or tenderness   Right lateral bend is Normal without pain or tenderness   Straight leg raising is Normal   Strength & tone: Normal   Stability overall normal stability    The patient has been educated about the nature of the problem(s) and counseled on treatment options.  The patient appeared to understand what I have discussed and is in agreement with it.  No diagnosis found.  PLAN Call if any problems.  Precautions discussed.  Continue current medications.   Return to clinic 1 month

## 2015-11-06 NOTE — Patient Instructions (Addendum)
Continue PT.  Return to work May 29th

## 2015-11-07 ENCOUNTER — Ambulatory Visit (HOSPITAL_COMMUNITY): Payer: BLUE CROSS/BLUE SHIELD | Attending: Orthopedic Surgery

## 2015-11-07 ENCOUNTER — Telehealth: Payer: Self-pay | Admitting: Orthopaedic Surgery

## 2015-11-07 DIAGNOSIS — R29898 Other symptoms and signs involving the musculoskeletal system: Secondary | ICD-10-CM | POA: Insufficient documentation

## 2015-11-07 DIAGNOSIS — R6889 Other general symptoms and signs: Secondary | ICD-10-CM

## 2015-11-07 DIAGNOSIS — M545 Low back pain, unspecified: Secondary | ICD-10-CM

## 2015-11-07 DIAGNOSIS — M6281 Muscle weakness (generalized): Secondary | ICD-10-CM | POA: Diagnosis present

## 2015-11-07 NOTE — Telephone Encounter (Signed)
Per Jacki ConesLaurie at [employer], she needs the last office visit note and work note on this patient.  She did fax me a release signed by Ms. Balistreri.  Dr. Sanjuan DameKeeling's office visit note for 11-06-15 and work note dated 11-06-15 have been faxed to [facility], attention: Jacki ConesLaurie at [phone]

## 2015-11-07 NOTE — Therapy (Signed)
[facility] [address] [place], [place], [address] Phone: [phone]   Fax:  [phone]  Physical Therapy Treatment  Patient Details  Name: Sandra Davies MRN: [id number] Date of Birth: 04-Apr-1961 Referring Provider: Sanjuana Kava   Encounter Date: 11/07/2015      PT End of Session - 11/07/15 1029    Visit Number 9   Number of Visits 15   Date for PT Re-Evaluation 11/25/15   Authorization Type BCBS   PT Start Time [phone]   PT Stop Time 1029   PT Time Calculation (min) 36 min   Activity Tolerance Patient tolerated treatment well   Behavior During Therapy [facility] for tasks assessed/performed      Past Medical History  Diagnosis Date  . Dysphagia 2015  . Constipation - functional     FOR A LONG TIME  . Arthritis   . GERD (gastroesophageal reflux disease)     Past Surgical History  Procedure Laterality Date  . Knee surgery    . Esophagogastroduodenoscopy N/A 09/03/2014    DJT:[id number] gastritis/dysphagia due to distal esophagel stricture  . Esophageal dilation N/A 09/03/2014    Procedure: ESOPHAGEAL DILATION;  Surgeon: Danie Binder, MD;  [facility];  Service: Endoscopy;  Laterality: N/A;  . Esophagogastroduodenoscopy N/A 09/26/2014    QZE:SPQZ non-erosive gastritis/stricture at the gastroesphageal    There were no vitals filed for this visit.      Subjective Assessment - 11/07/15 0957    Subjective Pt reports she is doing ok. HEP has been off due to a death in the family, and she is feeling a little down, but otherwise is motivated and ready to continue with progress.    Pertinent History Rt knee surgery    How long can you sit comfortably? 5/5- uses low furniture at home, 1-2 hours max (no changes since start, but not concerned with increaseing this)   How long can you stand comfortably? 5/5- 60-90 minutes (able to stand long enough to perform cooking +wash dished etc, without incrase in pain)    How long can  you walk comfortably? 5/5- 60 minutes (able to go a little further, but can tell taht back is aggravated)    Patient Stated Goals less pain; get back to work    Currently in Pain? Yes   Pain Score 5    Pain Location --  Right hip, central back.    Pain Orientation Right   Pain Descriptors / Indicators Aching   Pain Type Chronic pain   Pain Radiating Towards toward the laterl knee with weather changes.    Pain Onset More than a month ago   Pain Frequency Intermittent   Aggravating Factors  being up on feet too much, too much housework   Pain Relieving Factors HEP, stretches; getting up after theatre sign; occasional icepack    Effect of Pain on Daily Activities titrates activity and house work                          [facility] Adult PT Treatment/Exercise - 11/07/15 0001    Knee/Hip Exercises: Prone   Hamstring Curl --  Prone R hip IR 2x20, ER 2x20 AAROM   Manual Therapy   Soft tissue mobilization R post glute max/med, and Lumbar paraspinals bilat MFR.   24 minutes                  PT Short Term Goals -  11/07/15 1007    PT SHORT TERM GOAL #1   Title Pt to improve her hip and low back ROM to be able to pick items off the floor with ease   Time 3   Period Weeks   Status Achieved   PT SHORT TERM GOAL #2   Title Pt core strength to improve one grade to allow pt to complete an hour of housework without having increased pain.    Time 3   Period Weeks   Status Achieved   PT SHORT TERM GOAL #3   Title Pt to understand and to be using core stabiization with transitional motion to allow sit to stand and stand to sit to occur without any increased pain    Time 3   Period Weeks   Status Achieved   PT SHORT TERM GOAL #4   Title Pt to be able to demonstrate correct body mechanics for washing face, getting items out of a low cabinet in order to decrease stress on her low back to allow pain to decrease to no greater than a 3/10    Time 3   Period Weeks   Status  Achieved           PT Long Term Goals - 10/28/15 1320    PT LONG TERM GOAL #1   Title Pt core sterngth to improve to a 4+/5 to allow pt to carry in groceries without having increased pain    Time 6   Period Weeks   Status Partially Met   PT LONG TERM GOAL #2   Title Pt to be able to demonsrate proper body mechanics for lifting 12 to waist as well as to operate a Scientist, water quality in order to feel confident in returning to work    Time 6   Period Weeks   Status On-going   PT LONG TERM GOAL #3   Title Pt LE strength to be 5/5 to allow pt to use proper body mechanics with higher level activity to allow pt pain to decrease to no greater than a 1/10 to assist in returning to work.    Time 6   Period Weeks   Status On-going   PT LONG TERM GOAL #4   Title Pt to state that she is able to sleep on her back with comfort again    Baseline 4/25- reports she never could sleep on her back, not doing it right now    Time 6   Period Weeks   Status Deferred               Plan - 11/07/15 1030    Clinical Impression Statement Heavy focus on MFR for pain relief today and improved activation of deep R hip rotators. Making progress towatd goals, although muslces remains spastic in the lumbar region and latent with triggerponts in the R posterio hip. Pain decreases signfiicantly after session completion.    Rehab Potential Good   PT Frequency 2x / week   PT Duration 4 weeks   PT Treatment/Interventions Ultrasound;Cryotherapy;Electrical Stimulation;Iontophoresis 68m/ml Dexamethasone;Moist Heat;Traction;Gait training;Therapeutic exercise;Balance training;Manual techniques;Patient/family education;ADLs/Self Care Home Management;Therapeutic activities   PT Next Visit Plan Cont with ifting mechanics progressing to dynamic training along "assembly line"; proximal/core strength; consider more isolated deep hip rotatoer strengthening.     PT Home Exercise Plan no change   Consulted and Agree with Plan of  Care Patient      Patient will benefit from skilled therapeutic intervention in order to improve the following  deficits and impairments:  Decreased activity tolerance, Decreased balance, Decreased range of motion, Decreased strength, Difficulty walking, Impaired flexibility, Pain  Visit Diagnosis: Muscle weakness (generalized)  Bilateral low back pain without sciatica  Other symptoms and signs involving the musculoskeletal system  Proximal leg weakness  Decreased activity tolerance     Problem List Patient Active Problem List   Diagnosis Date Noted  . Midline low back pain without sciatica 11/06/2015  . Menopausal symptoms 04/09/2015  . Dysphagia, pharyngoesophageal phase   . Dysphagia 08/21/2014  . Constipation 08/21/2014  . IRREGULAR MENSTRUAL CYCLE 11/15/2007  . VERTIGO, CHRONIC 11/15/2007  . HEADACHE 11/15/2007  . SYNCOPE, HX OF 11/15/2007    10:33 AM, 11/07/2015 Etta Grandchild, PT, DPT PRN Physical Therapist - [place] License # [id number] [phone] (mobile)   [facility] [address] [place], [place], [address] Phone: [phone]   Fax:  [phone]  Name: Sandra Davies MRN: [id number] Date of Birth: 17-May-1961

## 2015-11-11 ENCOUNTER — Ambulatory Visit (HOSPITAL_COMMUNITY): Payer: BLUE CROSS/BLUE SHIELD

## 2015-11-11 ENCOUNTER — Encounter (HOSPITAL_COMMUNITY): Payer: BLUE CROSS/BLUE SHIELD | Admitting: Physical Therapy

## 2015-11-11 DIAGNOSIS — M545 Low back pain, unspecified: Secondary | ICD-10-CM

## 2015-11-11 DIAGNOSIS — M6281 Muscle weakness (generalized): Secondary | ICD-10-CM

## 2015-11-11 DIAGNOSIS — R29898 Other symptoms and signs involving the musculoskeletal system: Secondary | ICD-10-CM

## 2015-11-11 NOTE — Therapy (Signed)
[facility] [address] [place], [place], [address] Phone: [phone]   Fax:  [phone]  Physical Therapy Treatment  Patient Details  Name: Sandra Davies MRN: [id number] Date of Birth: 09-Dec-1960 Referring Provider: Sanjuana Kava   Encounter Date: 11/11/2015      PT End of Session - 11/11/15 1035    Visit Number 10   Number of Visits 15   Date for PT Re-Evaluation 11/25/15   Authorization Type BCBS   PT Start Time 0948   PT Stop Time 1033   PT Time Calculation (min) 45 min   Activity Tolerance Patient tolerated treatment well   Behavior During Therapy [facility] for tasks assessed/performed      Past Medical History  Diagnosis Date  . Dysphagia 2015  . Constipation - functional     FOR A LONG TIME  . Arthritis   . GERD (gastroesophageal reflux disease)     Past Surgical History  Procedure Laterality Date  . Knee surgery    . Esophagogastroduodenoscopy N/A 09/03/2014    ERD:[id number] gastritis/dysphagia due to distal esophagel stricture  . Esophageal dilation N/A 09/03/2014    Procedure: ESOPHAGEAL DILATION;  Surgeon: Danie Binder, MD;  [facility];  Service: Endoscopy;  Laterality: N/A;  . Esophagogastroduodenoscopy N/A 09/26/2014    EHU:DJSH non-erosive gastritis/stricture at the gastroesphageal    There were no vitals filed for this visit.      Subjective Assessment - 11/11/15 0938    Subjective Pt stated Rt hip pain scale 5/10, reports compliance with HEP and stated they are getting easier   Pertinent History Rt knee surgery    Patient Stated Goals less pain; get back to work    Currently in Pain? Yes   Pain Score 5    Pain Location Hip  Rt hip and lower back   Pain Orientation Right   Pain Descriptors / Indicators Aching   Pain Type Chronic pain   Pain Radiating Towards toward the laterl knee with weather changes   Pain Onset More than a month ago   Pain Frequency Intermittent   Aggravating  Factors  being up on feet too much, too much housework   Pain Relieving Factors HEP, stretches; getting up after theatre sign; occasional icepack   Effect of Pain on Daily Activities decreased tolerance with  activity and house work             [facility] Adult PT Treatment/Exercise - 11/11/15 0001    Exercises   Exercises Other Exercises;Knee/Hip   Other Exercises  Hip flexor stretch in half kneel 3x30 sec each; Lifting box from floor with focus on body mechanics, addition of 10# to box 2x20 reps   Lumbar Exercises: Standing   Lifting From floor;10 reps   Lifting Weights (lbs) 10   Wall Slides 10 reps   Wall Slides Limitations green TB around knees to prevent valgus   Lumbar Exercises: Supine   Ab Set 10 reps;3 seconds   Bridge 10 reps   Straight Leg Raise 10 reps   Straight Leg Raises Limitations with UE pressdown for TrA activation   Manual Therapy   Manual Therapy Muscle Energy Technique   Manual therapy comments completed separately from ther ex   Muscle Energy Technique Rt SI anterior rotation withj MET to improve alignment with core therex following              PT Short Term Goals - 11/07/15 1007    PT  SHORT TERM GOAL #1   Title Pt to improve her hip and low back ROM to be able to pick items off the floor with ease   Time 3   Period Weeks   Status Achieved   PT SHORT TERM GOAL #2   Title Pt core strength to improve one grade to allow pt to complete an hour of housework without having increased pain.    Time 3   Period Weeks   Status Achieved   PT SHORT TERM GOAL #3   Title Pt to understand and to be using core stabiization with transitional motion to allow sit to stand and stand to sit to occur without any increased pain    Time 3   Period Weeks   Status Achieved   PT SHORT TERM GOAL #4   Title Pt to be able to demonstrate correct body mechanics for washing face, getting items out of a low cabinet in order to decrease stress on her low back to allow pain to  decrease to no greater than a 3/10    Time 3   Period Weeks   Status Achieved           PT Long Term Goals - 10/28/15 1320    PT LONG TERM GOAL #1   Title Pt core sterngth to improve to a 4+/5 to allow pt to carry in groceries without having increased pain    Time 6   Period Weeks   Status Partially Met   PT LONG TERM GOAL #2   Title Pt to be able to demonsrate proper body mechanics for lifting 12 to waist as well as to operate a Scientist, water quality in order to feel confident in returning to work    Time 6   Period Weeks   Status On-going   PT LONG TERM GOAL #3   Title Pt LE strength to be 5/5 to allow pt to use proper body mechanics with higher level activity to allow pt pain to decrease to no greater than a 1/10 to assist in returning to work.    Time 6   Period Weeks   Status On-going   PT LONG TERM GOAL #4   Title Pt to state that she is able to sleep on her back with comfort again    Baseline 4/25- reports she never could sleep on her back, not doing it right now    Time 6   Period Weeks   Status Deferred               Plan - 11/11/15 1043    Clinical Impression Statement Noted LLD, checked SI alignment with noted Rt anterior SI rotation with increased leg length discrepencey.  Muscle energy technique to correct SI alignment with instructions for deep core strengthening.  Pt reports pain reduced to 2/10 following manual MET.  Session focus on improving core stabilizaiton to assist with SI alignment, functional stretches and educated on proper lifting mechanics.  Pt able to verbalize and demonstrate appropriate technqiue with minimal verbal and tactile cueing.     Rehab Potential Good   PT Frequency 2x / week   PT Duration 4 weeks   PT Treatment/Interventions Ultrasound;Cryotherapy;Electrical Stimulation;Iontophoresis 25m/ml Dexamethasone;Moist Heat;Traction;Gait training;Therapeutic exercise;Balance training;Manual techniques;Patient/family education;ADLs/Self Care Home  Management;Therapeutic activities   PT Next Visit Plan Check SI alignment with MET PRN, continue with proximal/ core strengthening; lifting mechanics progress to dynamic training along "assembly line" for RTW based activities; consider more isolated deep hip rotator strengtening.  Patient will benefit from skilled therapeutic intervention in order to improve the following deficits and impairments:  Decreased activity tolerance, Decreased balance, Decreased range of motion, Decreased strength, Difficulty walking, Impaired flexibility, Pain  Visit Diagnosis: Muscle weakness (generalized)  Bilateral low back pain without sciatica  Other symptoms and signs involving the musculoskeletal system     Problem List Patient Active Problem List   Diagnosis Date Noted  . Midline low back pain without sciatica 11/06/2015  . Menopausal symptoms 04/09/2015  . Dysphagia, pharyngoesophageal phase   . Dysphagia 08/21/2014  . Constipation 08/21/2014  . IRREGULAR MENSTRUAL CYCLE 11/15/2007  . VERTIGO, CHRONIC 11/15/2007  . HEADACHE 11/15/2007  . SYNCOPE, HX OF 11/15/2007   Ihor Austin, LPTA; [place]  Aldona Lento 11/11/2015, 11:02 AM  [facility] [place], [place], [address] Phone: [phone]   Fax:  [phone]  Name: Sandra Davies MRN: [id number] Date of Birth: 05/24/1961

## 2015-11-13 ENCOUNTER — Ambulatory Visit (HOSPITAL_COMMUNITY): Payer: BLUE CROSS/BLUE SHIELD

## 2015-11-13 ENCOUNTER — Encounter (HOSPITAL_COMMUNITY): Payer: BLUE CROSS/BLUE SHIELD

## 2015-11-13 DIAGNOSIS — M545 Low back pain, unspecified: Secondary | ICD-10-CM

## 2015-11-13 DIAGNOSIS — M6281 Muscle weakness (generalized): Secondary | ICD-10-CM

## 2015-11-13 DIAGNOSIS — R29898 Other symptoms and signs involving the musculoskeletal system: Secondary | ICD-10-CM

## 2015-11-13 DIAGNOSIS — R6889 Other general symptoms and signs: Secondary | ICD-10-CM

## 2015-11-13 NOTE — Therapy (Signed)
[facility] [address] [place], [place], [address] Phone: [phone]   Fax:  [phone]  Physical Therapy Treatment  Patient Details  Name: Sandra Davies MRN: [id number] Date of Birth: Jul 05, 1961 Referring Provider: Sanjuana Kava   Encounter Date: 11/13/2015      PT End of Session - 11/13/15 1031    Visit Number 11   Number of Visits 15   Date for PT Re-Evaluation 11/25/15   Authorization Type BCBS   PT Start Time [phone]   PT Stop Time 1030   PT Time Calculation (min) 42 min   Equipment Utilized During Treatment Gait belt   Activity Tolerance Patient tolerated treatment well   Behavior During Therapy [facility] for tasks assessed/performed      Past Medical History  Diagnosis Date  . Dysphagia 2015  . Constipation - functional     FOR A LONG TIME  . Arthritis   . GERD (gastroesophageal reflux disease)     Past Surgical History  Procedure Laterality Date  . Knee surgery    . Esophagogastroduodenoscopy N/A 09/03/2014    HTX:HFSFSELT gastritis/dysphagia due to distal esophagel stricture  . Esophageal dilation N/A 09/03/2014    Procedure: ESOPHAGEAL DILATION;  Surgeon: Danie Binder, MD;  [facility];  Service: Endoscopy;  Laterality: N/A;  . Esophagogastroduodenoscopy N/A 09/26/2014    RVU:YEBX non-erosive gastritis/stricture at the gastroesphageal    There were no vitals filed for this visit.      Subjective Assessment - 11/13/15 0952    Subjective Pt reports she is doing well today, pain 4/10, HEP going well.    Pertinent History Rt knee surgery    Currently in Pain? Yes   Pain Score 4    Pain Location --  R poterolateral hip   Pain Type Chronic pain                         [facility] Adult PT Treatment/Exercise - 11/13/15 0001    Lumbar Exercises: Stretches   Single Knee to Chest Stretch 3 reps;60 seconds  in THomas test position for hip flexor stretch (4# on knee)   Double Knee to Chest  Stretch --  gentle rocking for AAROM x25   Lumbar Exercises: Quadruped   Straight Leg Raise 1 second;15 reps  bilat   Straight Leg Raises Limitations knee straight   Knee/Hip Exercises: Standing   Other Standing Knee Exercises Deep quat + 5000g load -> overhead lift  3x15   Knee/Hip Exercises: Prone   Straight Leg Raises Right;2 sets;15 reps  For Self SI correction; R SI less painful to palpation after   Manual Therapy   Soft tissue mobilization R post glute max/med, and Lumbar paraspinals bilat MFR.   15 minutes                PT Education - 11/13/15 1031    Education provided Yes   Education Details self assessment of R SI pain and self correction PRN, Ad Lib   Person(s) Educated Patient   Methods Explanation   Comprehension Verbalized understanding          PT Short Term Goals - 11/07/15 1007    PT SHORT TERM GOAL #1   Title Pt to improve her hip and low back ROM to be able to pick items off the floor with ease   Time 3   Period Weeks   Status Achieved   PT SHORT TERM  GOAL #2   Title Pt core strength to improve one grade to allow pt to complete an hour of housework without having increased pain.    Time 3   Period Weeks   Status Achieved   PT SHORT TERM GOAL #3   Title Pt to understand and to be using core stabiization with transitional motion to allow sit to stand and stand to sit to occur without any increased pain    Time 3   Period Weeks   Status Achieved   PT SHORT TERM GOAL #4   Title Pt to be able to demonstrate correct body mechanics for washing face, getting items out of a low cabinet in order to decrease stress on her low back to allow pain to decrease to no greater than a 3/10    Time 3   Period Weeks   Status Achieved           PT Long Term Goals - 10/28/15 1320    PT LONG TERM GOAL #1   Title Pt core sterngth to improve to a 4+/5 to allow pt to carry in groceries without having increased pain    Time 6   Period Weeks   Status  Partially Met   PT LONG TERM GOAL #2   Title Pt to be able to demonsrate proper body mechanics for lifting 12 to waist as well as to operate a Scientist, water quality in order to feel confident in returning to work    Time 6   Period Weeks   Status On-going   PT LONG TERM GOAL #3   Title Pt LE strength to be 5/5 to allow pt to use proper body mechanics with higher level activity to allow pt pain to decrease to no greater than a 1/10 to assist in returning to work.    Time 6   Period Weeks   Status On-going   PT LONG TERM GOAL #4   Title Pt to state that she is able to sleep on her back with comfort again    Baseline 4/25- reports she never could sleep on her back, not doing it right now    Time 6   Period Weeks   Status Deferred               Plan - 11/13/15 1032    Clinical Impression Statement Pt making good progress toward goals. moved to low goblet squats this session, with a 12" riser for feedback. Pt loses control over pelvic tilt near bottom, hence will continue to work on core stability in this area. Mild Left knee recurvatum and joint niose noted with squats. Heavy verbal tctile cues requiresd to prevent valgus. Self correction for SI performed which resulted in sharp reduction of palpable pain in R SI joint.    Rehab Potential Good   PT Frequency 2x / week   PT Duration 4 weeks   PT Treatment/Interventions Ultrasound;Cryotherapy;Electrical Stimulation;Iontophoresis 72m/ml Dexamethasone;Moist Heat;Traction;Gait training;Therapeutic exercise;Balance training;Manual techniques;Patient/family education;ADLs/Self Care Home Management;Therapeutic activities   PT Next Visit Plan Check SI alignment with MET PRN, continue with proximal/ core strengthening; lifting mechanics progress to dynamic training along "assembly line" for RTW based activities; consider more isolated deep hip rotator strengtening.     PT Home Exercise Plan Added One.    Consulted and Agree with Plan of Care Patient       Patient will benefit from skilled therapeutic intervention in order to improve the following deficits and impairments:  Decreased activity tolerance, Decreased  balance, Decreased range of motion, Decreased strength, Difficulty walking, Impaired flexibility, Pain  Visit Diagnosis: Muscle weakness (generalized)  Bilateral low back pain without sciatica  Other symptoms and signs involving the musculoskeletal system  Proximal leg weakness  Decreased activity tolerance     Problem List Patient Active Problem List   Diagnosis Date Noted  . Midline low back pain without sciatica 11/06/2015  . Menopausal symptoms 04/09/2015  . Dysphagia, pharyngoesophageal phase   . Dysphagia 08/21/2014  . Constipation 08/21/2014  . IRREGULAR MENSTRUAL CYCLE 11/15/2007  . VERTIGO, CHRONIC 11/15/2007  . HEADACHE 11/15/2007  . SYNCOPE, HX OF 11/15/2007   10:35 AM, 11/13/2015 Etta Grandchild, PT, DPT PRN Physical Therapist - [place] License # [id number] [phone] (mobile)   [facility] [address] [place], [place], [address] Phone: [phone]   Fax:  [phone]  Name: Sandra Davies MRN: [id number] Date of Birth: [birth year]

## 2015-11-18 ENCOUNTER — Encounter (HOSPITAL_COMMUNITY): Payer: BLUE CROSS/BLUE SHIELD | Admitting: Physical Therapy

## 2015-11-18 ENCOUNTER — Ambulatory Visit (HOSPITAL_COMMUNITY): Payer: BLUE CROSS/BLUE SHIELD

## 2015-11-18 DIAGNOSIS — M545 Low back pain, unspecified: Secondary | ICD-10-CM

## 2015-11-18 DIAGNOSIS — M6281 Muscle weakness (generalized): Secondary | ICD-10-CM

## 2015-11-18 DIAGNOSIS — R29898 Other symptoms and signs involving the musculoskeletal system: Secondary | ICD-10-CM

## 2015-11-18 NOTE — Therapy (Addendum)
[facility] [address] [place], [place], [address] Phone: [phone]   Fax:  [phone]  Physical Therapy Treatment  Patient Details  Name: Sandra Davies MRN: [id number] Date of Birth: 1960/09/11 Referring Provider: Sanjuana Kava   Encounter Date: 11/18/2015      PT End of Session - 11/18/15 0957    Visit Number 12   Number of Visits 15   Date for PT Re-Evaluation 11/25/15   Authorization Type BCBS   PT Start Time 0950   PT Stop Time 1032   PT Time Calculation (min) 42 min   Activity Tolerance Patient tolerated treatment well   Behavior During Therapy [facility] for tasks assessed/performed      Past Medical History  Diagnosis Date  . Dysphagia 2015  . Constipation - functional     FOR A LONG TIME  . Arthritis   . GERD (gastroesophageal reflux disease)     Past Surgical History  Procedure Laterality Date  . Knee surgery    . Esophagogastroduodenoscopy N/A 09/03/2014    FXJ:[id number] gastritis/dysphagia due to distal esophagel stricture  . Esophageal dilation N/A 09/03/2014    Procedure: ESOPHAGEAL DILATION;  Surgeon: Danie Binder, MD;  [facility];  Service: Endoscopy;  Laterality: N/A;  . Esophagogastroduodenoscopy N/A 09/26/2014    YME:BRAX non-erosive gastritis/stricture at the gastroesphageal    There were no vitals filed for this visit.      Subjective Assessment - 11/18/15 0952    Subjective Pt reports she slipped in yard and fell Saturday able to stand herself, increased pain following.  Current lower back pain scale 4/10.  Reports relief with muscle energy technqiue.   Pertinent History Rt knee surgery    Patient Stated Goals less pain; get back to work    Currently in Pain? Yes   Pain Score 4    Pain Location Back   Pain Orientation Lower   Pain Descriptors / Indicators Aching;Tender   Pain Type Chronic pain   Pain Radiating Towards none   Pain Onset More than a month ago   Pain Frequency  Intermittent   Aggravating Factors  being up on feet too much, too much housework   Pain Relieving Factors HEP, stretches; getting up after theatre sign; occasional icepack   Effect of Pain on Daily Activities decreased tolerance with activity and house work            [facility] Adult PT Treatment/Exercise - 11/18/15 0001    Bed Mobility   Bed Mobility Sit to Sidelying Left   Sit to Sidelying Left 5: Supervision   Sit to Sidelying Left Details (indicate cue type and reason) instructed proper bed mobilty with log rolling   Lumbar Exercises: Stretches   Lower Trunk Rotation --  10 reps with core set   Lumbar Exercises: Aerobic   Tread Mill 5' incline 3 @ 1.4 mph with verbal cueing to improve gait mechanics x 5 min   Lumbar Exercises: Supine   Ab Set 10 reps;3 seconds   AB Set Limitations cueing for proper activation   Bridge 10 reps  2 sets with ab sets; 2nd set with Rt foot closer follow MET   Straight Leg Raise 10 reps   Straight Leg Raises Limitations with UE pressdown for TrA activation   Large Ball Abdominal Isometric 5 reps;5 seconds   Large Ball Oblique Isometric 5 reps;5 seconds   Other Supine Lumbar Exercises bent knee alt leg lower x20 reps  Manual Therapy   Manual Therapy Muscle Energy Technique   Manual therapy comments completed separately from ther ex   Muscle Energy Technique Rt SI anterior rotation with MET to improve alignment with gait training and core therex following                  PT Short Term Goals - 11/07/15 1007    PT SHORT TERM GOAL #1   Title Pt to improve her hip and low back ROM to be able to pick items off the floor with ease   Time 3   Period Weeks   Status Achieved   PT SHORT TERM GOAL #2   Title Pt core strength to improve one grade to allow pt to complete an hour of housework without having increased pain.    Time 3   Period Weeks   Status Achieved   PT SHORT TERM GOAL #3   Title Pt to understand and to be using core  stabiization with transitional motion to allow sit to stand and stand to sit to occur without any increased pain    Time 3   Period Weeks   Status Achieved   PT SHORT TERM GOAL #4   Title Pt to be able to demonstrate correct body mechanics for washing face, getting items out of a low cabinet in order to decrease stress on her low back to allow pain to decrease to no greater than a 3/10    Time 3   Period Weeks   Status Achieved           PT Long Term Goals - 10/28/15 1320    PT LONG TERM GOAL #1   Title Pt core sterngth to improve to a 4+/5 to allow pt to carry in groceries without having increased pain    Time 6   Period Weeks   Status Partially Met   PT LONG TERM GOAL #2   Title Pt to be able to demonsrate proper body mechanics for lifting 12 to waist as well as to operate a Scientist, water quality in order to feel confident in returning to work    Time 6   Period Weeks   Status On-going   PT LONG TERM GOAL #3   Title Pt LE strength to be 5/5 to allow pt to use proper body mechanics with higher level activity to allow pt pain to decrease to no greater than a 1/10 to assist in returning to work.    Time 6   Period Weeks   Status On-going   PT LONG TERM GOAL #4   Title Pt to state that she is able to sleep on her back with comfort again    Baseline 4/25- reports she never could sleep on her back, not doing it right now    Time 6   Period Weeks   Status Deferred               Plan - 11/18/15 1132    Clinical Impression Statement Noted leg discrepancy, decreased weight distrubution with gait and reports of increased tenderness over Rt PSIS and c/o Lt hip pain following fall on Saturday.  Checked SI alignment with Rt SI anterior rotated and increased tenderness with palpation.  Muscle energy technqiue with improve leg length and no reports of pain or tenderness over Rt PSIS, did contiue to have pain Lt hip soreness though decreased pain scale to 3/10.  Session focus following  improved SI alignment with core stabilization with  therapist facilitation to improve TrA activation.  Added gait training following MET to improve functional core activation with moderate verbal cueing to improve gait mechanics with ab sets. Began core stabilization exercises with theraball with min cueing for technqiue.  End of session no reports of pain lower back, pain scale 3/10 for Lt hip region.     Rehab Potential Good   PT Frequency 2x / week   PT Duration 4 weeks   PT Treatment/Interventions Ultrasound;Cryotherapy;Electrical Stimulation;Iontophoresis 16m/ml Dexamethasone;Moist Heat;Traction;Gait training;Therapeutic exercise;Balance training;Manual techniques;Patient/family education;ADLs/Self Care Home Management;Therapeutic activities   PT Next Visit Plan Check SI alignment with MET PRN, continue with proximal/ core strengthening; lifting mechanics progress to dynamic training along "assembly line" for RTW based activities; consider more isolated deep hip rotator strengtening.        Patient will benefit from skilled therapeutic intervention in order to improve the following deficits and impairments:  Decreased activity tolerance, Decreased balance, Decreased range of motion, Decreased strength, Difficulty walking, Impaired flexibility, Pain  Visit Diagnosis: Muscle weakness (generalized)  Bilateral low back pain without sciatica  Other symptoms and signs involving the musculoskeletal system     Problem List Patient Active Problem List   Diagnosis Date Noted  . Midline low back pain without sciatica 11/06/2015  . Menopausal symptoms 04/09/2015  . Dysphagia, pharyngoesophageal phase   . Dysphagia 08/21/2014  . Constipation 08/21/2014  . IRREGULAR MENSTRUAL CYCLE 11/15/2007  . VERTIGO, CHRONIC 11/15/2007  . HEADACHE 11/15/2007  . SYNCOPE, HX OF 11/15/2007   CIhor Austin LPTA; CVader CAldona Lento5/16/2017, 5:43 PM  [place] [address] [place] [address]Phone: [phone]  Fax:  [phone] Name: Sandra HERBISONMRN: [id number] of Birth: [birth year]

## 2015-11-19 ENCOUNTER — Ambulatory Visit (HOSPITAL_COMMUNITY): Payer: BLUE CROSS/BLUE SHIELD

## 2015-11-19 DIAGNOSIS — M6281 Muscle weakness (generalized): Secondary | ICD-10-CM | POA: Diagnosis not present

## 2015-11-19 DIAGNOSIS — M545 Low back pain, unspecified: Secondary | ICD-10-CM

## 2015-11-19 DIAGNOSIS — R29898 Other symptoms and signs involving the musculoskeletal system: Secondary | ICD-10-CM

## 2015-11-19 NOTE — Therapy (Signed)
[facility] [address] [place], [place], [address] Phone: [phone]   Fax:  [phone]  Physical Therapy Treatment  Patient Details  Name: Sandra Davies MRN: [id number] Date of Birth: 08/21/1960 Referring Provider: Sanjuana Kava   Encounter Date: 11/19/2015      PT End of Session - 11/19/15 1310    Visit Number 13   Number of Visits 15   Date for PT Re-Evaluation 11/25/15   Authorization Type BCBS   PT Start Time 1300   PT Stop Time 1350   PT Time Calculation (min) 50 min   Activity Tolerance Patient tolerated treatment well   Behavior During Therapy [facility] for tasks assessed/performed      Past Medical History  Diagnosis Date  . Dysphagia 2015  . Constipation - functional     FOR A LONG TIME  . Arthritis   . GERD (gastroesophageal reflux disease)     Past Surgical History  Procedure Laterality Date  . Knee surgery    . Esophagogastroduodenoscopy N/A 09/03/2014    ZMO:[id number] gastritis/dysphagia due to distal esophagel stricture  . Esophageal dilation N/A 09/03/2014    Procedure: ESOPHAGEAL DILATION;  Surgeon: Danie Binder, MD;  [facility];  Service: Endoscopy;  Laterality: N/A;  . Esophagogastroduodenoscopy N/A 09/26/2014    TKP:TWSF non-erosive gastritis/stricture at the gastroesphageal    There were no vitals filed for this visit.      Subjective Assessment - 11/19/15 1258    Subjective Pt stated she continues to feel the "nagging" on her lower back Lt>Rt side following fall this weekend.  Reports relief following muscle energy technique last session.     Pertinent History Rt knee surgery    Patient Stated Goals less pain; get back to work    Currently in Pain? Yes   Pain Score 4    Pain Location Back   Pain Orientation Lower;Left  Lt side following fall over weekend   Pain Descriptors / Indicators Nagging   Pain Type Chronic pain   Pain Radiating Towards none   Pain Onset More than a month  ago   Pain Frequency Intermittent   Aggravating Factors  being up on feet too much, too much housework   Pain Relieving Factors HEP, stretches; getting up after theatre sign; occasional icepack   Effect of Pain on Daily Activities decreased tolerance with activity and house work            [facility] Adult PT Treatment/Exercise - 11/19/15 0001    Lumbar Exercises: Stretches   Active Hamstring Stretch --   Active Hamstring Stretch Limitations --   Lower Trunk Rotation 10 seconds  10x 10" with core set   Lower Trunk Rotation Limitations 10x 10" with ab set   Quadruped Mid Back Stretch 3 reps;30 seconds   Quadruped Mid Back Stretch Limitations Child's pose    Quad Stretch 3 reps;30 seconds   Quad Stretch Limitations Thomas st supine with strap    Lumbar Exercises: Standing   Other Standing Lumbar Exercises RTW based activities: lifting ball from top of tall cabinet, squat to chair height, lift ball from floor, push and pull sleigh with 20-40#   Lumbar Exercises: Supine   Ab Set 10 reps;3 seconds   AB Set Limitations core bracing   Straight Leg Raise 10 reps   Straight Leg Raises Limitations with UE pressdown for TrA activation   Lumbar Exercises: Quadruped   Single Arm Raise Right;Left;10 reps;5 seconds  Straight Leg Raise 10 reps;3 seconds   Opposite Arm/Leg Raise Right arm/Left leg;Left arm/Right leg;5 reps;3 seconds                  PT Short Term Goals - 11/07/15 1007    PT SHORT TERM GOAL #1   Title Pt to improve her hip and low back ROM to be able to pick items off the floor with ease   Time 3   Period Weeks   Status Achieved   PT SHORT TERM GOAL #2   Title Pt core strength to improve one grade to allow pt to complete an hour of housework without having increased pain.    Time 3   Period Weeks   Status Achieved   PT SHORT TERM GOAL #3   Title Pt to understand and to be using core stabiization with transitional motion to allow sit to stand and stand to sit to  occur without any increased pain    Time 3   Period Weeks   Status Achieved   PT SHORT TERM GOAL #4   Title Pt to be able to demonstrate correct body mechanics for washing face, getting items out of a low cabinet in order to decrease stress on her low back to allow pain to decrease to no greater than a 3/10    Time 3   Period Weeks   Status Achieved           PT Long Term Goals - 10/28/15 1320    PT LONG TERM GOAL #1   Title Pt core sterngth to improve to a 4+/5 to allow pt to carry in groceries without having increased pain    Time 6   Period Weeks   Status Partially Met   PT LONG TERM GOAL #2   Title Pt to be able to demonsrate proper body mechanics for lifting 12 to waist as well as to operate a Scientist, water quality in order to feel confident in returning to work    Time 6   Period Weeks   Status On-going   PT LONG TERM GOAL #3   Title Pt LE strength to be 5/5 to allow pt to use proper body mechanics with higher level activity to allow pt pain to decrease to no greater than a 1/10 to assist in returning to work.    Time 6   Period Weeks   Status On-going   PT LONG TERM GOAL #4   Title Pt to state that she is able to sleep on her back with comfort again    Baseline 4/25- reports she never could sleep on her back, not doing it right now    Time 6   Period Weeks   Status Deferred               Plan - 11/19/15 1456    Clinical Impression Statement Checked SI alignment initially this morning, following core bracing exercises and instructing self care MET technqiues SI within alignment without manual technqiues required. Contined session focus on core strenghtening and progressed to RTW activities including proper lifting and "assembly limb" activiites.  Pt able to verbalize and demonstrate appropraite mechanics with proper lifting, carrying items close to body and push/pull with min therapist facilitation to improve mechanics.  End of session pt reports pain reduced to 2/10 with  improved gait mechanics.     Rehab Potential Good   PT Frequency 2x / week   PT Duration 4 weeks   PT  Treatment/Interventions Ultrasound;Cryotherapy;Electrical Stimulation;Iontophoresis 88m/ml Dexamethasone;Moist Heat;Traction;Gait training;Therapeutic exercise;Balance training;Manual techniques;Patient/family education;ADLs/Self Care Home Management;Therapeutic activities   PT Next Visit Plan Check SI alignment with MET PRN, continue with proximal/ core strengthening; lifting mechanics progress to dynamic training along "assembly line" for RTW based activities; consider more isolated deep hip rotator strengtening.        Patient will benefit from skilled therapeutic intervention in order to improve the following deficits and impairments:  Decreased activity tolerance, Decreased balance, Decreased range of motion, Decreased strength, Difficulty walking, Impaired flexibility, Pain  Visit Diagnosis: Muscle weakness (generalized)  Bilateral low back pain without sciatica  Other symptoms and signs involving the musculoskeletal system     Problem List Patient Active Problem List   Diagnosis Date Noted  . Midline low back pain without sciatica 11/06/2015  . Menopausal symptoms 04/09/2015  . Dysphagia, pharyngoesophageal phase   . Dysphagia 08/21/2014  . Constipation 08/21/2014  . IRREGULAR MENSTRUAL CYCLE 11/15/2007  . VERTIGO, CHRONIC 11/15/2007  . HEADACHE 11/15/2007  . SYNCOPE, HX OF 11/15/2007   CIhor Austin LPTA; CK[place] CAldona Lento5/17/2017, 3:06 PM  [place] [address]Phone: [phone]  Fax:  [phone] Name: JSUSSAN METERMRN: [id number] of Birth: [birth year]

## 2015-11-20 ENCOUNTER — Encounter (HOSPITAL_COMMUNITY): Payer: BLUE CROSS/BLUE SHIELD | Admitting: Physical Therapy

## 2015-11-20 ENCOUNTER — Encounter (HOSPITAL_COMMUNITY): Payer: BLUE CROSS/BLUE SHIELD

## 2015-11-25 ENCOUNTER — Encounter (HOSPITAL_COMMUNITY): Payer: BLUE CROSS/BLUE SHIELD | Admitting: Physical Therapy

## 2015-11-25 ENCOUNTER — Encounter (HOSPITAL_COMMUNITY): Payer: BLUE CROSS/BLUE SHIELD

## 2015-11-26 ENCOUNTER — Ambulatory Visit (HOSPITAL_COMMUNITY): Payer: BLUE CROSS/BLUE SHIELD | Admitting: Physical Therapy

## 2015-11-26 DIAGNOSIS — M545 Low back pain, unspecified: Secondary | ICD-10-CM

## 2015-11-26 DIAGNOSIS — R29898 Other symptoms and signs involving the musculoskeletal system: Secondary | ICD-10-CM

## 2015-11-26 DIAGNOSIS — M6281 Muscle weakness (generalized): Secondary | ICD-10-CM | POA: Diagnosis not present

## 2015-11-26 NOTE — Therapy (Signed)
[place], [place], [address] Phone: [phone]   Fax:  [phone]  Physical Therapy Treatment (Re-Assess)  Patient Details  Name: Sandra Davies MRN: [id number] Date of Birth: [birth year] Referring Provider: Sanjuana Kava   Encounter Date: 11/26/2015      PT End of Session - 11/26/15 0939    Visit Number 14   Number of Visits 15   Date for PT Re-Evaluation 12/31/15   Authorization Type BCBS   PT Start Time 0900   PT Stop Time 0940   PT Time Calculation (min) 40 min   Activity Tolerance Patient tolerated treatment well   Behavior During Therapy [facility] for tasks assessed/performed      Past Medical History  Diagnosis Date  . Dysphagia 2015  . Constipation - functional     FOR A LONG TIME  . Arthritis   . GERD (gastroesophageal reflux disease)     Past Surgical History  Procedure Laterality Date  . Knee surgery    . Esophagogastroduodenoscopy N/A 09/03/2014    MAY:OKHTXHFS gastritis/dysphagia due to distal esophagel stricture  . Esophageal dilation N/A 09/03/2014    Procedure: ESOPHAGEAL DILATION;  Surgeon: Danie Binder, MD;  [facility];  Service: Endoscopy;  Laterality: N/A;  . Esophagogastroduodenoscopy N/A 09/26/2014    FSE:LTRV non-erosive gastritis/stricture at the gastroesphageal    There were no vitals filed for this visit.      Subjective Assessment - 11/26/15 0901    Subjective patient reports taht she is still getting better, reports that house cleaning and standing extended periods are much easier; she still does ahve to take her time with vacuuming and sweeping. She hada fall a couple of weeks ago, just lost her balance outside in the yard but had no injury. Has not been back to work yet.    Pertinent History Rt knee surgery    How long can you sit comfortably? 5/24- 2 hours    How long can you stand comfortably? 5/24- 2 horus    How long can you walk comfortably? 5/24- 60  minutes    Patient Stated Goals less pain; get back to work    Currently in Pain? Yes   Pain Score 4    Pain Location Back   Pain Orientation Lower;Mid   Pain Descriptors / Indicators Nagging   Pain Type Chronic pain   Pain Radiating Towards none    Pain Onset More than a month ago   Pain Frequency Constant   Aggravating Factors  moving around too much, rain    Pain Relieving Factors taking medicine and doing exercises    Effect of Pain on Daily Activities sometimes it gets in teh way, sometimes it does not             [facility] PT Assessment - 11/26/15 0001    Observation/Other Assessments   Observations good functional lifting technique noted from floor to waist height    Focus on Therapeutic Outcomes (FOTO)  61   AROM   Lumbar Flexion approximately 2 inches from floor    Lumbar Extension 19 degrees    Strength   Right Hip Flexion 5/5   Right Hip Extension 4+/5   Right Hip ABduction 5/5   Left Hip Flexion 5/5   Left Hip Extension 4+/5   Left Hip ABduction 5/5   Right Knee Flexion 5/5   Right Knee Extension 5/5   Left Knee Flexion 5/5   Left Knee Extension  5/5   Right Ankle Dorsiflexion 5/5   Left Ankle Dorsiflexion 5/5   6 minute walk test results    Aerobic Endurance Distance Walked 1180   Endurance additional comments 6MWT                      [facility] Adult PT Treatment/Exercise - 11/26/15 0001    Lumbar Exercises: Standing   Heel Raises 15 reps   Heel Raises Limitations heel and toe    Functional Squats 15 reps   Functional Squats Limitations in front of low mat table, cues for form    Other Standing Lumbar Exercises forward step ups on 8 inch box 1x15   Lumbar Exercises: Seated   LAQ on Ball Limitations on swiss ball:  seated marches, alternate UE/LE work 1x15                 PT Education - 11/26/15 0939    Education provided Yes   Education Details progess with skilled PT services, plan of care, DC in one session    Person(s) Educated  Patient   Methods Explanation   Comprehension Verbalized understanding          PT Short Term Goals - 11/26/15 0920    PT SHORT TERM GOAL #1   Title Pt to improve her hip and low back ROM to be able to pick items off the floor with ease   Baseline 5/24- no problems noted, good form    Time 3   Period Weeks   Status Achieved   PT SHORT TERM GOAL #2   Title Pt core strength to improve one grade to allow pt to complete an hour of housework without having increased pain.    Baseline 5/24- able to do this    Time 3   Period Weeks   Status Achieved   PT SHORT TERM GOAL #3   Title Pt to understand and to be using core stabiization with transitional motion to allow sit to stand and stand to sit to occur without any increased pain    Baseline 5/24- reports she has been doing this effectively    Time 3   Period Weeks   Status Achieved   PT SHORT TERM GOAL #4   Title Pt to be able to demonstrate correct body mechanics for washing face, getting items out of a low cabinet in order to decrease stress on her low back to allow pain to decrease to no greater than a 3/10    Baseline 5/24- aggravates her pain a little bit up to 4-5/10   Time 3   Period Weeks   Status On-going           PT Long Term Goals - 11/26/15 8850    PT LONG TERM GOAL #1   Title Pt core sterngth to improve to a 4+/5 to allow pt to carry in groceries without having increased pain    Baseline 5/24- 5/5 in general    Time 6   Period Weeks   Status Achieved   PT LONG TERM GOAL #2   Title Pt to be able to demonsrate proper body mechanics for lifting 12 to waist as well as to operate a Scientist, water quality in order to feel confident in returning to work    Baseline 5/24- good functional lifting mechanics today    Time 6   Period Weeks   Status Partially Met   PT LONG TERM GOAL #3   Title Pt  LE strength to be 5/5 to allow pt to use proper body mechanics with higher level activity to allow pt pain to decrease to no greater  than a 1/10 to assist in returning to work.    Baseline 5/24- strength portion met, still having pain however    Time 6   Period Weeks   Status Partially Met   PT LONG TERM GOAL #4   Title Pt to state that she is able to sleep on her back with comfort again    Baseline 5/24- has never slept on her back, always sleeps on her sides    Time 6   Period Weeks   Status Deferred               Plan - 11/26/15 1423    Clinical Impression Statement Re-assessment performed today. Patient doing very well overall with skilled PT services, with good improvement in strength, gait tolerance, lumbar ROM tolerance, and functional lifting form; no LLD found in supine today. At this point patient does appear to have impaired perception of her own functioanl abiltiy however, ranking her current function as a 50/100 on a subjective scale. Recommend one more skilled session in order to teach patient self SI-correction and perform advanced CKC exercises in order to ensure that patient maintains good form/core activation throughotu functional standing activities in order to ensure carryover to work based tasks.    Rehab Potential Good   PT Frequency Other (comment)  one more session    PT Duration Other (comment)  one more session    PT Treatment/Interventions Ultrasound;Cryotherapy;Electrical Stimulation;Iontophoresis 79m/ml Dexamethasone;Moist Heat;Traction;Gait training;Therapeutic exercise;Balance training;Manual techniques;Patient/family education;ADLs/Self Care Home Management;Therapeutic activities   PT Next Visit Plan teach self-SI alignment; advanced CKC exercises; core work on sStage manager DC    Consulted and Agree with Plan of Care Patient      Patient will benefit from skilled therapeutic intervention in order to improve the following deficits and impairments:  Decreased activity tolerance, Decreased balance, Decreased range of motion, Decreased strength, Difficulty walking, Impaired flexibility,  Pain  Visit Diagnosis: Muscle weakness (generalized)  Bilateral low back pain without sciatica  Other symptoms and signs involving the musculoskeletal system     Problem List Patient Active Problem List   Diagnosis Date Noted  . Midline low back pain without sciatica 11/06/2015  . Menopausal symptoms 04/09/2015  . Dysphagia, pharyngoesophageal phase   . Dysphagia 08/21/2014  . Constipation 08/21/2014  . IRREGULAR MENSTRUAL CYCLE 11/15/2007  . VERTIGO, CHRONIC 11/15/2007  . HEADACHE 11/15/2007  . SYNCOPE, HX OF 11/15/2007    KDeniece ReePT, DPT [address] [place] [address]Phone: [phone]  Fax:  [phone] Name: JTAMU GOLZMRN: [id number] of Birth: 706-23-1962

## 2015-11-27 ENCOUNTER — Encounter (HOSPITAL_COMMUNITY): Payer: BLUE CROSS/BLUE SHIELD | Admitting: Physical Therapy

## 2015-11-27 ENCOUNTER — Ambulatory Visit (HOSPITAL_COMMUNITY): Payer: BLUE CROSS/BLUE SHIELD | Admitting: Physical Therapy

## 2015-11-27 DIAGNOSIS — M545 Low back pain, unspecified: Secondary | ICD-10-CM

## 2015-11-27 DIAGNOSIS — M6281 Muscle weakness (generalized): Secondary | ICD-10-CM | POA: Diagnosis not present

## 2015-11-27 DIAGNOSIS — R29898 Other symptoms and signs involving the musculoskeletal system: Secondary | ICD-10-CM

## 2015-11-27 NOTE — Patient Instructions (Signed)
   MUSCLE ENERGY - SI  While lying on your back, raise up your knee and press it into your hand. With the other leg, press your foot into the ground. Hold for at least 5-10 seconds and then relax; repeat 5-6 times to perform full correction.   Perform these at the same time.      QUADRUPED ALTERNATE ARM AND LEG   While in a crawling position, brace at your abdominals and then slowly lift a leg and opposite arm upwards.   Maintain a level and stable pelvis and spine the entire time.   Repeat 10-15 times each side, twice a day.

## 2015-11-27 NOTE — Therapy (Signed)
[facility] [address] [place], [place], [address] Phone: [phone]   Fax:  [phone]  Physical Therapy Treatment (Discharge)  Patient Details  Name: Sandra Davies MRN: [id number] Date of Birth: 1961-03-22 Referring Provider: Sanjuana Kava   Encounter Date: 11/27/2015      PT End of Session - 11/27/15 0950    Visit Number 15   Number of Visits 15   Authorization Type [employer]   PT Start Time 0904   PT Stop Time 0943   PT Time Calculation (min) 39 min   Activity Tolerance Patient tolerated treatment well   Behavior During Therapy [facility] for tasks assessed/performed      Past Medical History  Diagnosis Date  . Dysphagia 2015  . Constipation - functional     FOR A LONG TIME  . Arthritis   . GERD (gastroesophageal reflux disease)     Past Surgical History  Procedure Laterality Date  . Knee surgery    . Esophagogastroduodenoscopy N/A 09/03/2014    QAS:UORVIFBP gastritis/dysphagia due to distal esophagel stricture  . Esophageal dilation N/A 09/03/2014    Procedure: ESOPHAGEAL DILATION;  Surgeon: Danie Binder, MD;  [facility];  Service: Endoscopy;  Laterality: N/A;  . Esophagogastroduodenoscopy N/A 09/26/2014    PHK:FEXM non-erosive gastritis/stricture at the gastroesphageal    There were no vitals filed for this visit.      Subjective Assessment - 11/27/15 0908    Subjective Patient arrives reporting that she is feeling good, just a little achey because of the rain today. No major changes since yesterday.    Pertinent History Rt knee surgery    Patient Stated Goals less pain; get back to work    Currently in Pain? Yes   Pain Score 5    Pain Location Back   Pain Orientation Lower;Mid                         [facility] Adult PT Treatment/Exercise - 11/27/15 0001    Lumbar Exercises: Stretches   Active Hamstring Stretch 3 reps;30 seconds   Active Hamstring Stretch Limitations 12 inch box    Passive Hamstring Stretch 3 reps;30 seconds   Passive Hamstring Stretch Limitations gastroc on stlantboard    Single Knee to Chest Stretch 10 seconds   Single Knee to Chest Stretch Limitations 10x10   Lower Trunk Rotation 10 seconds   Lower Trunk Rotation Limitations 10x 10" with ab set   Piriformis Stretch 3 reps;30 seconds   Piriformis Stretch Limitations seated    Lumbar Exercises: Seated   LAQ on Ball Limitations alternate UE/LE on swiss ball 1x10   Lumbar Exercises: Quadruped   Opposite Arm/Leg Raise Right arm/Left leg;Left arm/Right leg;10 reps   Opposite Arm/Leg Raise Limitations pole for form                 PT Education - 11/27/15 0949    Education provided Yes   Education Details self-SI correction, advanced HEP, [employer] waiver, DC today    Person(s) Educated Patient   Methods Explanation   Comprehension Verbalized understanding          PT Short Term Goals - 11/26/15 0920    PT SHORT TERM GOAL #1   Title Pt to improve her hip and low back ROM to be able to pick items off the floor with ease   Baseline 5/24- no problems noted, good form    Time 3  Period Weeks   Status Achieved   PT SHORT TERM GOAL #2   Title Pt core strength to improve one grade to allow pt to complete an hour of housework without having increased pain.    Baseline 5/24- able to do this    Time 3   Period Weeks   Status Achieved   PT SHORT TERM GOAL #3   Title Pt to understand and to be using core stabiization with transitional motion to allow sit to stand and stand to sit to occur without any increased pain    Baseline 5/24- reports she has been doing this effectively    Time 3   Period Weeks   Status Achieved   PT SHORT TERM GOAL #4   Title Pt to be able to demonstrate correct body mechanics for washing face, getting items out of a low cabinet in order to decrease stress on her low back to allow pain to decrease to no greater than a 3/10    Baseline 5/24- aggravates her pain a little  bit up to 4-5/10   Time 3   Period Weeks   Status On-going           PT Long Term Goals - 11/26/15 0388    PT LONG TERM GOAL #1   Title Pt core sterngth to improve to a 4+/5 to allow pt to carry in groceries without having increased pain    Baseline 5/24- 5/5 in general    Time 6   Period Weeks   Status Achieved   PT LONG TERM GOAL #2   Title Pt to be able to demonsrate proper body mechanics for lifting 12 to waist as well as to operate a Scientist, water quality in order to feel confident in returning to work    Baseline 5/24- good functional lifting mechanics today    Time 6   Period Weeks   Status Partially Met   PT LONG TERM GOAL #3   Title Pt LE strength to be 5/5 to allow pt to use proper body mechanics with higher level activity to allow pt pain to decrease to no greater than a 1/10 to assist in returning to work.    Baseline 5/24- strength portion met, still having pain however    Time 6   Period Weeks   Status Partially Met   PT LONG TERM GOAL #4   Title Pt to state that she is able to sleep on her back with comfort again    Baseline 5/24- has never slept on her back, always sleeps on her sides    Time 6   Period Weeks   Status Deferred               Plan - 11/27/15 0919    Clinical Impression Statement Final session perforemd today. Patient arrived with approximately 5/10 pain today and reports taht part of this might be related to pain, performed some functional stretching this morning to assist in reducing pain before starting work on more advanced core exercises including quadruped and seated ther-ball activities today with good tolerance by patient and only occasional-mn cues for form. DC to independent program today. Taught self-SI correction today as well using method with cane/broomstick handle, complete with handout.  DC today.    Rehab Potential Good   PT Next Visit Plan DC today    PT Home Exercise Plan self-SI correction, quadruped   Consulted and Agree with  Plan of Care Patient  Patient will benefit from skilled therapeutic intervention in order to improve the following deficits and impairments:  Decreased activity tolerance, Decreased balance, Decreased range of motion, Decreased strength, Difficulty walking, Impaired flexibility, Pain  Visit Diagnosis: Muscle weakness (generalized)  Bilateral low back pain without sciatica  Other symptoms and signs involving the musculoskeletal system     Problem List Patient Active Problem List   Diagnosis Date Noted  . Midline low back pain without sciatica 11/06/2015  . Menopausal symptoms 04/09/2015  . Dysphagia, pharyngoesophageal phase   . Dysphagia 08/21/2014  . Constipation 08/21/2014  . IRREGULAR MENSTRUAL CYCLE 11/15/2007  . VERTIGO, CHRONIC 11/15/2007  . HEADACHE 11/15/2007  . SYNCOPE, HX OF 11/15/2007    PHYSICAL THERAPY DISCHARGE SUMMARY  Visits from Start of Care: 15  Current functional level related to goals / functional outcomes: Patient satisfied with her current functional status and has made good progress with skilled PT services, although she has yet to return to work at this point. Provided advanced exercises and [employer] waiver today, also educated patient that today will be last skilled session. DC today.    Remaining deficits: Core weakness, low back pain    Education / Equipment: Self-SI correction, advanced HEP exercises, [employer] waiver  Plan: Patient agrees to discharge.  Patient goals were partially met. Patient is being discharged due to being pleased with the current functional level.  ?????        Deniece Ree PT, DPT [facility] [address] [place], [place], [address] Phone: [phone]   Fax:  [phone]  Name: Sandra Davies MRN: [id number] Date of Birth: 02-Jun-1961

## 2015-12-04 ENCOUNTER — Encounter: Payer: Self-pay | Admitting: Orthopaedic Surgery

## 2015-12-04 ENCOUNTER — Ambulatory Visit (INDEPENDENT_AMBULATORY_CARE_PROVIDER_SITE_OTHER): Payer: BLUE CROSS/BLUE SHIELD | Admitting: Orthopaedic Surgery

## 2015-12-04 VITALS — BP 124/91 | HR 94 | Temp 97.7°F | Ht 67.5 in | Wt 158.0 lb

## 2015-12-04 DIAGNOSIS — M545 Low back pain, unspecified: Secondary | ICD-10-CM

## 2015-12-04 MED ORDER — CYCLOBENZAPRINE HCL 10 MG PO TABS: 10.0000 mg | ORAL_TABLET | Freq: Three times a day (TID) | ORAL | Status: DC | PRN

## 2015-12-04 MED ORDER — HYDROCODONE-ACETAMINOPHEN 7.5-325 MG PO TABS: 1.0000 | ORAL_TABLET | ORAL | Status: DC | PRN

## 2015-12-04 NOTE — Progress Notes (Signed)
Patient Sandra Davies [name], female DOB:Dec 18, 1960, 55 y.o. WJX:[id number]  Chief Complaint  Patient presents with  . Follow-up    back    HPI  Sandra Davies is a 55 y.o. female who has chronic lower back pain. She has been doing her exercises she learned in PT and is much improved.  She has no paresthesias.  She has no bowel or bladder problems.  She is sleeping well.  I will have her return to work June 12th.  HPI  Body mass index is 24.37 kg/(m^2).  ROS  Review of Systems  Constitutional:       Patient does not have Diabetes Mellitus. Patient does not have hypertension. Patient does not have COPD or shortness of breath. Patient does not have BMI > 35. Patient does not have current smoking history.   HENT: Negative for congestion.   Respiratory: Negative for cough and shortness of breath.   Cardiovascular: Negative for chest pain.  Gastrointestinal: Positive for constipation.       GERD  Endocrine: Positive for cold intolerance.  Musculoskeletal: Positive for back pain and joint swelling (right knee).  All other systems reviewed and are negative.   Past Medical History  Diagnosis Date  . Dysphagia 2015  . Constipation - functional     FOR A LONG TIME  . Arthritis   . GERD (gastroesophageal reflux disease)     Past Surgical History  Procedure Laterality Date  . Knee surgery    . Esophagogastroduodenoscopy N/A 09/03/2014    OZH:YQMVHQIO gastritis/dysphagia due to distal esophagel stricture  . Esophageal dilation N/A 09/03/2014    Procedure: ESOPHAGEAL DILATION;  Surgeon: West Bali, MD;  [facility];  Service: Endoscopy;  Laterality: N/A;  . Esophagogastroduodenoscopy N/A 09/26/2014    NGE:XBMW non-erosive gastritis/stricture at the gastroesphageal    Family History  Problem Relation Age of Onset  . Breast cancer Mother     DECEASED  . Colon polyps Neg Hx   . Colon cancer Neg Hx   . Stomach cancer Neg Hx   . Alzheimer's disease Father   .  Heart disease Brother   . Lupus Sister     Social History Social History  Substance Use Topics  . Smoking status: Never Smoker   . Smokeless tobacco: Never Used  . Alcohol Use: 0.0 oz/week    0 Standard drinks or equivalent per week     Comment: very occasional    No Known Allergies  Current Outpatient Prescriptions  Medication Sig Dispense Refill  . diclofenac (VOLTAREN) 75 MG EC tablet Take 1 tablet (75 mg total) by mouth 2 (two) times daily with a meal. 60 tablet 2  . diphenhydrAMINE (BENADRYL) 25 MG tablet Take 25 mg by mouth every 6 (six) hours as needed for allergies. Reported on 08/21/2015    . HYDROcodone-acetaminophen (NORCO) 7.5-325 MG tablet Take 1 tablet by mouth every 4 (four) hours as needed for moderate pain (Must last 30 days.  Do not drive or operate machinery while taking this medicine.). 120 tablet 0  . omeprazole (PRILOSEC) 20 MG capsule TAKE 1 CAPSULE BY MOUTH 30 MINUTES PRIOR TO BREAKFAST AND SUPPER 60 capsule 11  . progesterone (PROMETRIUM) 200 MG capsule Take 1 capsule (200 mg total) by mouth daily. Take x 14 days each month (first 14 days) 42 capsule 3  . cyclobenzaprine (FLEXERIL) 10 MG tablet Take 1 tablet (10 mg total) by mouth 3 (three) times daily as needed for muscle spasms. 90 tablet 3  .  estradiol (ALORA) 0.1 MG/24HR patch Place 1 patch (0.1 mg total) onto the skin 2 (two) times a week. (Patient not taking: Reported on 12/04/2015) 8 patch 12   No current facility-administered medications for this visit.     Physical Exam  Blood pressure 124/91, pulse 94, temperature 97.7 F (36.5 C), height 5' 7.5" (1.715 m), weight 158 lb (71.668 kg), last menstrual period 07/02/2013.  Constitutional: overall normal hygiene, normal nutrition, well developed, normal grooming, normal body habitus. Assistive device:none  Musculoskeletal: gait and station Limp none, muscle tone and strength are normal, no tremors or atrophy is present.  .  Neurological: coordination  overall normal.  Deep tendon reflex/nerve stretch intact.  Sensation normal.  Cranial nerves II-XII intact.   Skin:   normal overall no scars, lesions, ulcers or rashes. No psoriasis.  Psychiatric: Alert and oriented x 3.  Recent memory intact, remote memory unclear.  Normal mood and affect. Well groomed.  Good eye contact.  Cardiovascular: overall no swelling, no varicosities, no edema bilaterally, normal temperatures of the legs and arms, no clubbing, cyanosis and good capillary refill.  Lymphatic: palpation is normal.  Spine/Pelvis examination:  Inspection:  Overall, sacoiliac joint benign and hips nontender; without crepitus or defects.   Thoracic spine inspection: Alignment normal without kyphosis present   Lumbar spine inspection:  Alignment  with normal lumbar lordosis, without scoliosis apparent.   Thoracic spine palpation:  without tenderness of spinal processes   Lumbar spine palpation: with tenderness of lumbar area; without tightness of lumbar muscles    Range of Motion:   Lumbar flexion, forward flexion is full  without pain or tenderness    Lumbar extension is full  without pain or tenderness   Left lateral bend is Normal  without pain or tenderness   Right lateral bend is Normal without pain or tenderness   Straight leg raising is Normal   Strength & tone: Normal   Stability overall normal stability     The patient has been educated about the nature of the problem(s) and counseled on treatment options.  The patient appeared to understand what I have discussed and is in agreement with it.  Encounter Diagnosis  Name Primary?  . Midline low back pain without sciatica Yes    PLAN Call if any problems.  Precautions discussed.  Continue current medications.   Return to clinic 1 month   Electronically Signed Darreld McleanWayne Jonus Coble, MD 6/1/201710:31 AM

## 2015-12-04 NOTE — Patient Instructions (Signed)
Return to work June 12, regular duty.

## 2016-01-01 ENCOUNTER — Ambulatory Visit (INDEPENDENT_AMBULATORY_CARE_PROVIDER_SITE_OTHER): Payer: BLUE CROSS/BLUE SHIELD | Admitting: Orthopaedic Surgery

## 2016-01-01 ENCOUNTER — Encounter: Payer: Self-pay | Admitting: Orthopaedic Surgery

## 2016-01-01 VITALS — BP 136/87 | HR 94 | Temp 97.7°F | Resp 16 | Ht 67.5 in | Wt 155.6 lb

## 2016-01-01 DIAGNOSIS — M545 Low back pain, unspecified: Secondary | ICD-10-CM

## 2016-01-01 DIAGNOSIS — M25561 Pain in right knee: Secondary | ICD-10-CM | POA: Diagnosis not present

## 2016-01-01 MED ORDER — HYDROCODONE-ACETAMINOPHEN 7.5-325 MG PO TABS: 1.0000 | ORAL_TABLET | ORAL | Status: DC | PRN

## 2016-01-01 NOTE — Progress Notes (Signed)
Patient Sandra Davies:Sandra Davies, female DOB:07/21/1960, 55 y.o. NFA:[id number]  Chief Complaint  Patient presents with  . Follow-up    back pain    HPI  Barkley BrunsJacqueline D Tully is a 55 y.o. female who has lower back pain and chronic right knee pain.  Her lower back is improved.  She has been doing the exercises she learned in PT and they have helped.  She has no paresthesias.  She has no bowel or bladder problems.  The right knee has chronic pain.  She has some swelling and popping. She has no new trauma, no redness, no giving way.  She is active and working regularly.  HPI  Body mass index is 24 kg/(m^2).  ROS  Review of Systems  Past Medical History  Diagnosis Date  . Dysphagia 2015  . Constipation - functional     FOR A LONG TIME  . Arthritis   . GERD (gastroesophageal reflux disease)     Past Surgical History  Procedure Laterality Date  . Knee surgery    . Esophagogastroduodenoscopy N/A 09/03/2014    ION:[id number]:moderate gastritis/dysphagia due to distal esophagel stricture  . Esophageal dilation N/A 09/03/2014    Procedure: ESOPHAGEAL DILATION;  Surgeon: West BaliSandi L Fields, MD;  [facility];  Service: Endoscopy;  Laterality: N/A;  . Esophagogastroduodenoscopy N/A 09/26/2014    LKG:MWNUSLF:mild non-erosive gastritis/stricture at the gastroesphageal    Family History  Problem Relation Age of Onset  . Breast cancer Mother     DECEASED  . Colon polyps Neg Hx   . Colon cancer Neg Hx   . Stomach cancer Neg Hx   . Alzheimer's disease Father   . Heart disease Brother   . Lupus Sister     Social History Social History  Substance Use Topics  . Smoking status: Never Smoker   . Smokeless tobacco: Never Used  . Alcohol Use: 0.0 oz/week    0 Standard drinks or equivalent per week     Comment: very occasional    No Known Allergies  Current Outpatient Prescriptions  Medication Sig Dispense Refill  . cyclobenzaprine (FLEXERIL) 10 MG tablet Take 1 tablet (10 mg total) by mouth 3  (three) times daily as needed for muscle spasms. 90 tablet 3  . diclofenac (VOLTAREN) 75 MG EC tablet Take 1 tablet (75 mg total) by mouth 2 (two) times daily with a meal. 60 tablet 2  . diphenhydrAMINE (BENADRYL) 25 MG tablet Take 25 mg by mouth every 6 (six) hours as needed for allergies. Reported on 08/21/2015    . estradiol (ALORA) 0.1 MG/24HR patch Place 1 patch (0.1 mg total) onto the skin 2 (two) times a week. 8 patch 12  . HYDROcodone-acetaminophen (NORCO) 7.5-325 MG tablet Take 1 tablet by mouth every 4 (four) hours as needed for moderate pain (Must last 30 days.  Do not drive or operate machinery while taking this medicine.). 120 tablet 0  . omeprazole (PRILOSEC) 20 MG capsule TAKE 1 CAPSULE BY MOUTH 30 MINUTES PRIOR TO BREAKFAST AND SUPPER 60 capsule 11  . progesterone (PROMETRIUM) 200 MG capsule Take 1 capsule (200 mg total) by mouth daily. Take x 14 days each month (first 14 days) 42 capsule 3   No current facility-administered medications for this visit.     Physical Exam  Blood pressure 136/87, pulse 94, temperature 97.7 F (36.5 C), resp. rate 16, height 5' 7.5" (1.715 m), weight 155 lb 9.6 oz (70.58 kg), last menstrual period 07/02/2013.  Constitutional: overall normal hygiene, normal  nutrition, well developed, normal grooming, normal body habitus. Assistive device:none  Musculoskeletal: gait and station Limp right, muscle tone and strength are normal, no tremors or atrophy is present.  .  Neurological: coordination overall normal.  Deep tendon reflex/nerve stretch intact.  Sensation normal.  Cranial nerves II-XII intact.   Skin:   normal overall no scars, lesions, ulcers or rashes. No psoriasis.  Psychiatric: Alert and oriented x 3.  Recent memory intact, remote memory unclear.  Normal mood and affect. Well groomed.  Good eye contact.  Cardiovascular: overall no swelling, no varicosities, no edema bilaterally, normal temperatures of the legs and arms, no clubbing,  cyanosis and good capillary refill.  Lymphatic: palpation is normal.  Spine/Pelvis examination:  Inspection:  Overall, sacoiliac joint benign and hips nontender; without crepitus or defects.   Thoracic spine inspection: Alignment normal without kyphosis present   Lumbar spine inspection:  Alignment  with normal lumbar lordosis, without scoliosis apparent.   Thoracic spine palpation:  without tenderness of spinal processes   Lumbar spine palpation: with tenderness of lumbar area; without tightness of lumbar muscles    Range of Motion:   Lumbar flexion, forward flexion is full  without pain or tenderness    Lumbar extension is full  without pain or tenderness   Left lateral bend is Normal  without pain or tenderness   Right lateral bend is Normal without pain or tenderness   Straight leg raising is Normal   Strength & tone: Normal   Stability overall normal stability   The right lower extremity is examined:  Inspection:  Thigh:  Non-tender and no defects  Knee has swelling 1+ effusion.                        Joint tenderness is present                        Patient is tender over the medial joint line  Lower Leg:  Has normal appearance and no tenderness or defects  Ankle:  Non-tender and no defects  Foot:  Non-tender and no defects Range of Motion:  Knee:  Range of motion is: 0-110                        Crepitus is  present  Ankle:  Range of motion is normal. Strength and Tone:  The right lower extremity has normal strength and tone. Stability:  Knee:  The knee is stable.  Ankle:  The ankle is stable.  The left knee is negative.  The patient has been educated about the nature of the problem(s) and counseled on treatment options.  The patient appeared to understand what I have discussed and is in agreement with it.  Encounter Diagnoses  Name Primary?  . Midline low back pain without sciatica Yes  . Right knee pain     PLAN Call if any problems.  Precautions  discussed.  Continue current medications.   Return to clinic 6 weeks   Electronically Signed Darreld McleanWayne Emmanuella Mirante, MD 6/29/20171:47 PM

## 2016-01-29 ENCOUNTER — Telehealth: Payer: Self-pay | Admitting: Orthopaedic Surgery

## 2016-02-02 ENCOUNTER — Telehealth: Payer: Self-pay | Admitting: Orthopaedic Surgery

## 2016-02-02 MED ORDER — HYDROCODONE-ACETAMINOPHEN 7.5-325 MG PO TABS: 1.0000 | ORAL_TABLET | ORAL | 0 refills | Status: DC | PRN

## 2016-02-02 NOTE — Telephone Encounter (Signed)
Hydrocodone-Acetaminophen 7.5/325mg Qty 120 Tablets °

## 2016-02-03 ENCOUNTER — Other Ambulatory Visit: Payer: Self-pay | Admitting: Gastroenterology

## 2016-02-12 ENCOUNTER — Ambulatory Visit: Payer: BLUE CROSS/BLUE SHIELD | Admitting: Orthopaedic Surgery

## 2016-02-17 ENCOUNTER — Ambulatory Visit (INDEPENDENT_AMBULATORY_CARE_PROVIDER_SITE_OTHER): Payer: BLUE CROSS/BLUE SHIELD | Admitting: Orthopaedic Surgery

## 2016-02-17 ENCOUNTER — Encounter: Payer: Self-pay | Admitting: Orthopaedic Surgery

## 2016-02-17 VITALS — BP 134/86 | HR 91 | Temp 97.7°F | Ht 68.0 in | Wt 159.0 lb

## 2016-02-17 DIAGNOSIS — M545 Low back pain, unspecified: Secondary | ICD-10-CM

## 2016-02-17 NOTE — Progress Notes (Signed)
[name], female DOB:04/28/1961, 55 y.o. WJX:[id number]  Chief Complaint  Patient presents with  . Follow-up    back pain, right knee pain    HPI  Sandra Davies is a 55 y.o. female who has lower back pain.  She is much improved.  She has no paresthesias. She has no trauma.  She is doing her exercises. HPI  Body mass index is 24.18 kg/m.  ROS  Review of Systems  Constitutional:       Patient does not have Diabetes Mellitus. Patient does not have hypertension. Patient does not have COPD or shortness of breath. Patient does not have BMI > 35. Patient does not have current smoking history.   HENT: Negative for congestion.   Respiratory: Negative for cough and shortness of breath.   Cardiovascular: Negative for chest pain.  Gastrointestinal: Positive for constipation.       GERD  Endocrine: Positive for cold intolerance.  Musculoskeletal: Positive for back pain and joint swelling (right knee).  All other systems reviewed and are negative.   Past Medical History:  Diagnosis Date  . Arthritis   . Constipation - functional    FOR A LONG TIME  . Dysphagia 2015  . GERD (gastroesophageal reflux disease)     Past Surgical History:  Procedure Laterality Date  . ESOPHAGEAL DILATION N/A 09/03/2014   Procedure: ESOPHAGEAL DILATION;  Surgeon: West BaliSandi L Fields, MD;  Location: [facility];  Service: Endoscopy;  Laterality: N/A;  . ESOPHAGOGASTRODUODENOSCOPY N/A 09/03/2014   OZH:[id number]:moderate gastritis/dysphagia due to distal esophagel stricture  . ESOPHAGOGASTRODUODENOSCOPY N/A 09/26/2014   NGE:XBMWSLF:mild non-erosive gastritis/stricture at the gastroesphageal  . KNEE SURGERY      Family History  Problem Relation Age of Onset  . Breast cancer Mother     DECEASED  . Colon polyps Neg Hx   . Colon cancer Neg Hx   . Stomach cancer Neg Hx   . Alzheimer's disease Father   . Heart disease Brother   . Lupus Sister     Social History Social History  Substance Use  Topics  . Smoking status: Never Smoker  . Smokeless tobacco: Never Used  . Alcohol use 0.0 oz/week     Comment: very occasional    No Known Allergies  Current Outpatient Prescriptions  Medication Sig Dispense Refill  . cyclobenzaprine (FLEXERIL) 10 MG tablet Take 1 tablet (10 mg total) by mouth 3 (three) times daily as needed for muscle spasms. 90 tablet 3  . diclofenac (VOLTAREN) 75 MG EC tablet TAKE 1 TABLET (75 MG TOTAL) BY MOUTH 2 (TWO) TIMES DAILY WITH A MEAL. 60 tablet 5  . diphenhydrAMINE (BENADRYL) 25 MG tablet Take 25 mg by mouth every 6 (six) hours as needed for allergies. Reported on 08/21/2015    . estradiol (ALORA) 0.1 MG/24HR patch Place 1 patch (0.1 mg total) onto the skin 2 (two) times a week. 8 patch 12  . HYDROcodone-acetaminophen (NORCO) 7.5-325 MG tablet Take 1 tablet by mouth every 4 (four) hours as needed for moderate pain (Must last 30 days.  Do not drive or operate machinery while taking this medicine.). 120 tablet 0  . omeprazole (PRILOSEC) 20 MG capsule TAKE 1 CAPSULE BY MOUTH 30 MINUTES PRIOR TO BREAKFAST AND SUPPER 60 capsule 11  . omeprazole (PRILOSEC) 20 MG capsule TAKE 1 CAPSULE BY MOUTH 30 MINUTES PRIOR TO BREAKFAST AND SUPPER 60 capsule 11  . progesterone (PROMETRIUM) 200 MG capsule Take 1 capsule (200 mg total) by mouth daily. Take  x 14 days each month (first 14 days) 42 capsule 3   No current facility-administered medications for this visit.      Physical Exam  Blood pressure 134/86, pulse 91, temperature 97.7 F (36.5 C), height 5\' 8"  (1.727 m), weight 159 lb (72.1 kg), last menstrual period 07/02/2013.  Constitutional: overall normal hygiene, normal nutrition, well developed, normal grooming, normal body habitus. Assistive device:none  Musculoskeletal: gait and station Limp none, muscle tone and strength are normal, no tremors or atrophy is present.  .  Neurological: coordination overall normal.  Deep tendon reflex/nerve stretch intact.  Sensation  normal.  Cranial nerves II-XII intact.   Skin:   normal overall no scars, lesions, ulcers or rashes. No psoriasis.  Psychiatric: Alert and oriented x 3.  Recent memory intact, remote memory unclear.  Normal mood and affect. Well groomed.  Good eye contact.  Cardiovascular: overall no swelling, no varicosities, no edema bilaterally, normal temperatures of the legs and arms, no clubbing, cyanosis and good capillary refill.  Lymphatic: palpation is normal.  Spine/Pelvis examination:  Inspection:  Overall, sacoiliac joint benign and hips nontender; without crepitus or defects.   Thoracic spine inspection: Alignment normal without kyphosis present   Lumbar spine inspection:  Alignment  with normal lumbar lordosis, without scoliosis apparent.   Thoracic spine palpation:  without tenderness of spinal processes   Lumbar spine palpation: without tenderness of lumbar area; without tightness of lumbar muscles    Range of Motion:   Lumbar flexion, forward flexion is full without pain or tenderness    Lumbar extension is full without pain or tenderness   Left lateral bend is Normal  without pain or tenderness   Right lateral bend is Normal without pain or tenderness   Straight leg raising is Normal   Strength & tone: Normal   Stability overall normal stability     The patient has been educated about the nature of the problem(s) and counseled on treatment options.  The patient appeared to understand what I have discussed and is in agreement with it.  Encounter Diagnosis  Name Primary?  . Midline low back pain without sciatica Yes    PLAN Call if any problems.  Precautions discussed.  Continue current medications.   Return to clinic 3 months   Electronically Signed Darreld McleanWayne Purnell Daigle, MD 8/15/20172:13 PM

## 2016-03-01 ENCOUNTER — Telehealth: Payer: Self-pay | Admitting: Orthopaedic Surgery

## 2016-03-01 MED ORDER — HYDROCODONE-ACETAMINOPHEN 7.5-325 MG PO TABS: 1.0000 | ORAL_TABLET | Freq: Four times a day (QID) | ORAL | 0 refills | Status: DC | PRN

## 2016-03-01 NOTE — Telephone Encounter (Signed)
Hydeocodone-Acetaminophen  7.5/325mg   Qty 120 Tablets

## 2016-03-31 ENCOUNTER — Telehealth: Payer: Self-pay | Admitting: Orthopaedic Surgery

## 2016-03-31 MED ORDER — HYDROCODONE-ACETAMINOPHEN 7.5-325 MG PO TABS: 1.0000 | ORAL_TABLET | Freq: Four times a day (QID) | ORAL | 0 refills | Status: DC | PRN

## 2016-03-31 NOTE — Telephone Encounter (Signed)
Patient requests refill:  HYDROcodone-acetaminophen (NORCO) 7.5-325 MG tablet 110 tablet  - insurance is [employer]

## 2016-04-27 ENCOUNTER — Telehealth: Payer: Self-pay | Admitting: Orthopaedic Surgery

## 2016-04-28 ENCOUNTER — Telehealth: Payer: Self-pay | Admitting: Orthopaedic Surgery

## 2016-04-28 ENCOUNTER — Encounter: Payer: Self-pay | Admitting: Orthopaedic Surgery

## 2016-04-29 MED ORDER — HYDROCODONE-ACETAMINOPHEN 7.5-325 MG PO TABS: 1.0000 | ORAL_TABLET | Freq: Four times a day (QID) | ORAL | 0 refills | Status: DC | PRN

## 2016-05-19 ENCOUNTER — Ambulatory Visit: Payer: BLUE CROSS/BLUE SHIELD | Admitting: Orthopaedic Surgery

## 2016-05-25 ENCOUNTER — Ambulatory Visit: Payer: BLUE CROSS/BLUE SHIELD | Admitting: Orthopaedic Surgery

## 2016-05-25 ENCOUNTER — Telehealth: Payer: Self-pay | Admitting: Orthopaedic Surgery

## 2016-05-25 MED ORDER — HYDROCODONE-ACETAMINOPHEN 7.5-325 MG PO TABS: 1.0000 | ORAL_TABLET | Freq: Four times a day (QID) | ORAL | 0 refills | Status: DC | PRN

## 2016-06-01 ENCOUNTER — Ambulatory Visit (INDEPENDENT_AMBULATORY_CARE_PROVIDER_SITE_OTHER): Payer: BLUE CROSS/BLUE SHIELD | Admitting: Orthopaedic Surgery

## 2016-06-01 ENCOUNTER — Ambulatory Visit: Payer: BLUE CROSS/BLUE SHIELD | Admitting: Orthopaedic Surgery

## 2016-06-01 VITALS — BP 153/99 | HR 90 | Temp 97.3°F | Ht 68.0 in | Wt 156.0 lb

## 2016-06-01 DIAGNOSIS — G8929 Other chronic pain: Secondary | ICD-10-CM | POA: Diagnosis not present

## 2016-06-01 DIAGNOSIS — M545 Low back pain: Secondary | ICD-10-CM

## 2016-06-01 NOTE — Progress Notes (Signed)
Patient WU:JWJXBJYNWG:Sandra Davies, female DOB:04/17/1961, 55 y.o. NFA:[id number]  Chief Complaint  Patient presents with  . Follow-up    Back pain    HPI  Sandra Davies is a 55 y.o. female who has chronic lower back pain. She is stable. She is doing her exercises and taking her medicine. She has no paresthesias or new trauma. HPI  Body mass index is 23.72 kg/m.  ROS  Review of Systems  Constitutional:       Patient does not have Diabetes Mellitus. Patient does not have hypertension. Patient does not have COPD or shortness of breath. Patient does not have BMI > 35. Patient does not have current smoking history.   HENT: Negative for congestion.   Respiratory: Negative for cough and shortness of breath.   Cardiovascular: Negative for chest pain.  Gastrointestinal: Positive for constipation.       GERD  Endocrine: Positive for cold intolerance.  Musculoskeletal: Positive for back pain and joint swelling (right knee).  All other systems reviewed and are negative.   Past Medical History:  Diagnosis Date  . Arthritis   . Constipation - functional    FOR A LONG TIME  . Dysphagia 2015  . GERD (gastroesophageal reflux disease)     Past Surgical History:  Procedure Laterality Date  . ESOPHAGEAL DILATION N/A 09/03/2014   Procedure: ESOPHAGEAL DILATION;  Surgeon: West BaliSandi L Fields, MD;  Location: [facility];  Service: Endoscopy;  Laterality: N/A;  . ESOPHAGOGASTRODUODENOSCOPY N/A 09/03/2014   ION:[id number]:moderate gastritis/dysphagia due to distal esophagel stricture  . ESOPHAGOGASTRODUODENOSCOPY N/A 09/26/2014   LKG:MWNUSLF:mild non-erosive gastritis/stricture at the gastroesphageal  . KNEE SURGERY      Family History  Problem Relation Age of Onset  . Breast cancer Mother     DECEASED  . Colon polyps Neg Hx   . Colon cancer Neg Hx   . Stomach cancer Neg Hx   . Alzheimer's disease Father   . Heart disease Brother   . Lupus Sister     Social History Social History  Substance Use  Topics  . Smoking status: Never Smoker  . Smokeless tobacco: Never Used  . Alcohol use 0.0 oz/week     Comment: very occasional    No Known Allergies  Current Outpatient Prescriptions  Medication Sig Dispense Refill  . cyclobenzaprine (FLEXERIL) 10 MG tablet Take 1 tablet (10 mg total) by mouth at bedtime. 30 tablet 3  . diclofenac (VOLTAREN) 75 MG EC tablet TAKE 1 TABLET (75 MG TOTAL) BY MOUTH 2 (TWO) TIMES DAILY WITH A MEAL. 60 tablet 5  . diphenhydrAMINE (BENADRYL) 25 MG tablet Take 25 mg by mouth every 6 (six) hours as needed for allergies. Reported on 08/21/2015    . estradiol (ALORA) 0.1 MG/24HR patch Place 1 patch (0.1 mg total) onto the skin 2 (two) times a week. 8 patch 12  . HYDROcodone-acetaminophen (NORCO) 7.5-325 MG tablet Take 1 tablet by mouth every 6 (six) hours as needed for moderate pain (Must last 30 days.Do not drive or operate machinery while taking this medicine.). 100 tablet 0  . omeprazole (PRILOSEC) 20 MG capsule TAKE 1 CAPSULE BY MOUTH 30 MINUTES PRIOR TO BREAKFAST AND SUPPER 60 capsule 11  . omeprazole (PRILOSEC) 20 MG capsule TAKE 1 CAPSULE BY MOUTH 30 MINUTES PRIOR TO BREAKFAST AND SUPPER 60 capsule 11  . progesterone (PROMETRIUM) 200 MG capsule Take 1 capsule (200 mg total) by mouth daily. Take x 14 days each month (first 14 days) 42 capsule 3  No current facility-administered medications for this visit.      Physical Exam  Blood pressure (!) 153/99, pulse 90, temperature 97.3 F (36.3 C), height 5\' 8"  (1.727 m), weight 156 lb (70.8 kg), last menstrual period 07/02/2013.  Constitutional: overall normal hygiene, normal nutrition, well developed, normal grooming, normal body habitus. Assistive device:none  Musculoskeletal: gait and station Limp none, muscle tone and strength are normal, no tremors or atrophy is present.  .  Neurological: coordination overall normal.  Deep tendon reflex/nerve stretch intact.  Sensation normal.  Cranial nerves II-XII  intact.   Skin:   Normal overall no scars, lesions, ulcers or rashes. No psoriasis.  Psychiatric: Alert and oriented x 3.  Recent memory intact, remote memory unclear.  Normal mood and affect. Well groomed.  Good eye contact.  Cardiovascular: overall no swelling, no varicosities, no edema bilaterally, normal temperatures of the legs and arms, no clubbing, cyanosis and good capillary refill.  Lymphatic: palpation is normal.  Spine/Pelvis examination:  Inspection:  Overall, sacoiliac joint benign and hips nontender; without crepitus or defects.   Thoracic spine inspection: Alignment normal without kyphosis present   Lumbar spine inspection:  Alignment  with normal lumbar lordosis, without scoliosis apparent.   Thoracic spine palpation:  without tenderness of spinal processes   Lumbar spine palpation: with tenderness of lumbar area; without tightness of lumbar muscles    Range of Motion:   Lumbar flexion, forward flexion is 45 without pain or tenderness    Lumbar extension is full without pain or tenderness   Left lateral bend is Normal  without pain or tenderness   Right lateral bend is Normal without pain or tenderness   Straight leg raising is Normal   Strength & tone: Normal   Stability overall normal stability     The patient has been educated about the nature of the problem(s) and counseled on treatment options.  The patient appeared to understand what I have discussed and is in agreement with it.  Encounter Diagnosis  Name Primary?  . Chronic midline low back pain without sciatica Yes    PLAN Call if any problems.  Precautions discussed.  Continue current medications.   Return to clinic 3 months   Electronically Signed Darreld McleanWayne Chidera Thivierge, MD 11/28/20179:59 AM

## 2016-06-21 ENCOUNTER — Telehealth: Payer: Self-pay | Admitting: Orthopaedic Surgery

## 2016-06-21 NOTE — Telephone Encounter (Signed)
Hydrocodone-Acetaminophen  7.5/325mg  Qty 100 Tablets °

## 2016-06-22 MED ORDER — HYDROCODONE-ACETAMINOPHEN 7.5-325 MG PO TABS: 1.0000 | ORAL_TABLET | Freq: Four times a day (QID) | ORAL | 0 refills | Status: DC | PRN

## 2016-07-13 ENCOUNTER — Other Ambulatory Visit: Payer: Self-pay | Admitting: Orthopaedic Surgery

## 2016-07-13 NOTE — Telephone Encounter (Signed)
Denied.  Muscle relaxants not recommended for long term. 

## 2016-07-25 ENCOUNTER — Telehealth: Payer: Self-pay | Admitting: Orthopaedic Surgery

## 2016-07-26 ENCOUNTER — Telehealth: Payer: Self-pay | Admitting: Orthopaedic Surgery

## 2016-07-27 MED ORDER — HYDROCODONE-ACETAMINOPHEN 7.5-325 MG PO TABS: 1.0000 | ORAL_TABLET | Freq: Four times a day (QID) | ORAL | 0 refills | Status: DC | PRN

## 2016-08-26 ENCOUNTER — Telehealth: Payer: Self-pay | Admitting: Orthopaedic Surgery

## 2016-08-30 MED ORDER — HYDROCODONE-ACETAMINOPHEN 7.5-325 MG PO TABS: 1.0000 | ORAL_TABLET | Freq: Four times a day (QID) | ORAL | 0 refills | Status: DC | PRN

## 2016-09-01 ENCOUNTER — Encounter: Payer: Self-pay | Admitting: Orthopaedic Surgery

## 2016-09-01 ENCOUNTER — Ambulatory Visit (INDEPENDENT_AMBULATORY_CARE_PROVIDER_SITE_OTHER): Payer: BLUE CROSS/BLUE SHIELD | Admitting: Orthopaedic Surgery

## 2016-09-01 VITALS — BP 128/86 | HR 103 | Temp 97.5°F | Ht 67.5 in | Wt 157.0 lb

## 2016-09-01 DIAGNOSIS — G8929 Other chronic pain: Secondary | ICD-10-CM

## 2016-09-01 DIAGNOSIS — M545 Low back pain: Secondary | ICD-10-CM | POA: Diagnosis not present

## 2016-09-01 NOTE — Progress Notes (Signed)
Patient Sandra Davies:Analy Sandra Davies, female DOB:06/21/1961, 56 y.o. HQI:[id number]  Chief Complaint  Patient presents with  . Back Pain    Lumbar pain    HPI  Sandra Davies is a 56 y.o. female who has chronic lower back pain.  She is stable.  She has no new trauma, no paresthesias.  She is doing her exercises and working hard. HPI  Body mass index is 24.23 kg/m.  ROS  Review of Systems  Constitutional:       Patient does not have Diabetes Mellitus. Patient does not have hypertension. Patient does not have COPD or shortness of breath. Patient does not have BMI > 35. Patient does not have current smoking history.   HENT: Negative for congestion.   Respiratory: Negative for cough and shortness of breath.   Cardiovascular: Negative for chest pain.  Gastrointestinal: Positive for constipation.       GERD  Endocrine: Positive for cold intolerance.  Musculoskeletal: Positive for back pain and joint swelling (right knee).  All other systems reviewed and are negative.   Past Medical History:  Diagnosis Date  . Arthritis   . Constipation - functional    FOR A LONG TIME  . Dysphagia 2015  . GERD (gastroesophageal reflux disease)     Past Surgical History:  Procedure Laterality Date  . ESOPHAGEAL DILATION N/A 09/03/2014   Procedure: ESOPHAGEAL DILATION;  Surgeon: West BaliSandi L Fields, MD;  Location: [facility];  Service: Endoscopy;  Laterality: N/A;  . ESOPHAGOGASTRODUODENOSCOPY N/A 09/03/2014   XLK:[id number]:moderate gastritis/dysphagia due to distal esophagel stricture  . ESOPHAGOGASTRODUODENOSCOPY N/A 09/26/2014   ZDG:UYQISLF:mild non-erosive gastritis/stricture at the gastroesphageal  . KNEE SURGERY      Family History  Problem Relation Age of Onset  . Breast cancer Mother     DECEASED  . Colon polyps Neg Hx   . Colon cancer Neg Hx   . Stomach cancer Neg Hx   . Alzheimer's disease Father   . Heart disease Brother   . Lupus Sister     Social History Social History  Substance Use Topics   . Smoking status: Never Smoker  . Smokeless tobacco: Never Used  . Alcohol use 0.0 oz/week     Comment: very occasional    No Known Allergies  Current Outpatient Prescriptions  Medication Sig Dispense Refill  . cyclobenzaprine (FLEXERIL) 10 MG tablet Take 1 tablet (10 mg total) by mouth at bedtime. 30 tablet 3  . diclofenac (VOLTAREN) 75 MG EC tablet TAKE 1 TABLET (75 MG TOTAL) BY MOUTH 2 (TWO) TIMES DAILY WITH A MEAL. 60 tablet 5  . diphenhydrAMINE (BENADRYL) 25 MG tablet Take 25 mg by mouth every 6 (six) hours as needed for allergies. Reported on 08/21/2015    . estradiol (ALORA) 0.1 MG/24HR patch Place 1 patch (0.1 mg total) onto the skin 2 (two) times a week. 8 patch 12  . HYDROcodone-acetaminophen (NORCO) 7.5-325 MG tablet Take 1 tablet by mouth every 6 (six) hours as needed for moderate pain (Must last 30 days.Do not drive or operate machinery while taking this medicine.). 100 tablet 0  . omeprazole (PRILOSEC) 20 MG capsule TAKE 1 CAPSULE BY MOUTH 30 MINUTES PRIOR TO BREAKFAST AND SUPPER 60 capsule 11  . omeprazole (PRILOSEC) 20 MG capsule TAKE 1 CAPSULE BY MOUTH 30 MINUTES PRIOR TO BREAKFAST AND SUPPER 60 capsule 11  . progesterone (PROMETRIUM) 200 MG capsule Take 1 capsule (200 mg total) by mouth daily. Take x 14 days each month (first 14 days) 42  capsule 3   No current facility-administered medications for this visit.      Physical Exam  Blood pressure 128/86, pulse (!) 103, temperature 97.5 F (36.4 C), height 5' 7.5" (1.715 m), weight 157 lb (71.2 kg), last menstrual period 07/02/2013.  Constitutional: overall normal hygiene, normal nutrition, well developed, normal grooming, normal body habitus. Assistive device:none  Musculoskeletal: gait and station Limp none, muscle tone and strength are normal, no tremors or atrophy is present.  .  Neurological: coordination overall normal.  Deep tendon reflex/nerve stretch intact.  Sensation normal.  Cranial nerves II-XII intact.    Skin:   Normal overall no scars, lesions, ulcers or rashes. No psoriasis.  Psychiatric: Alert and oriented x 3.  Recent memory intact, remote memory unclear.  Normal mood and affect. Well groomed.  Good eye contact.  Cardiovascular: overall no swelling, no varicosities, no edema bilaterally, normal temperatures of the legs and arms, no clubbing, cyanosis and good capillary refill.  Lymphatic: palpation is normal.  Spine/Pelvis examination:  Inspection:  Overall, sacoiliac joint benign and hips nontender; without crepitus or defects.   Thoracic spine inspection: Alignment normal without kyphosis present   Lumbar spine inspection:  Alignment  with normal lumbar lordosis, without scoliosis apparent.   Thoracic spine palpation:  without tenderness of spinal processes   Lumbar spine palpation: with tenderness of lumbar area; without tightness of lumbar muscles    Range of Motion:   Lumbar flexion, forward flexion is 45 without pain or tenderness    Lumbar extension is full without pain or tenderness   Left lateral bend is Normal  without pain or tenderness   Right lateral bend is Normal without pain or tenderness   Straight leg raising is Normal   Strength & tone: Normal   Stability overall normal stability     The patient has been educated about the nature of the problem(s) and counseled on treatment options.  The patient appeared to understand what I have discussed and is in agreement with it.  Encounter Diagnosis  Name Primary?  . Chronic midline low back pain without sciatica Yes    PLAN Call if any problems.  Precautions discussed.  Continue current medications.   Return to clinic 3 months   Electronically Signed Darreld Mclean, MD 2/28/20181:40 PM

## 2016-09-22 ENCOUNTER — Telehealth: Payer: Self-pay | Admitting: Orthopaedic Surgery

## 2016-09-23 MED ORDER — HYDROCODONE-ACETAMINOPHEN 7.5-325 MG PO TABS: 1.0000 | ORAL_TABLET | Freq: Four times a day (QID) | ORAL | 0 refills | Status: DC | PRN

## 2016-10-14 ENCOUNTER — Ambulatory Visit (INDEPENDENT_AMBULATORY_CARE_PROVIDER_SITE_OTHER): Payer: BLUE CROSS/BLUE SHIELD

## 2016-10-14 ENCOUNTER — Encounter: Payer: Self-pay | Admitting: Orthopaedic Surgery

## 2016-10-14 ENCOUNTER — Ambulatory Visit (INDEPENDENT_AMBULATORY_CARE_PROVIDER_SITE_OTHER): Payer: BLUE CROSS/BLUE SHIELD | Admitting: Orthopaedic Surgery

## 2016-10-14 VITALS — BP 136/97 | HR 90 | Temp 97.3°F | Ht 67.5 in | Wt 159.0 lb

## 2016-10-14 DIAGNOSIS — M545 Low back pain, unspecified: Secondary | ICD-10-CM

## 2016-10-14 DIAGNOSIS — G8929 Other chronic pain: Secondary | ICD-10-CM | POA: Diagnosis not present

## 2016-10-14 DIAGNOSIS — M79641 Pain in right hand: Secondary | ICD-10-CM

## 2016-10-14 DIAGNOSIS — M25561 Pain in right knee: Secondary | ICD-10-CM | POA: Diagnosis not present

## 2016-10-14 MED ORDER — PREDNISONE 5 MG (21) PO TBPK: ORAL_TABLET | ORAL | 0 refills | Status: DC

## 2016-10-14 NOTE — Patient Instructions (Signed)
Out of work from yesterday thru this coming weekend.

## 2016-10-14 NOTE — Progress Notes (Signed)
Patient ON:Sandra Davies [name], female DOB:1960/09/27, 55 y.o. GMW:[id number]  Chief Complaint  Patient presents with  . Follow-up    low back pain, right knee pain  . Hand Pain    right    HPI  Sandra Davies is a 56 y.o. female who has had increasing pain in the right hand from changing to a new job at work.  She has swelling of the hand more around the thumb.  She has no redness or numbness. HPI  Body mass index is 24.54 kg/m.  ROS  Review of Systems  Constitutional:       Patient does not have Diabetes Mellitus. Patient does not have hypertension. Patient does not have COPD or shortness of breath. Patient does not have BMI > 35. Patient does not have current smoking history.   HENT: Negative for congestion.   Respiratory: Negative for cough and shortness of breath.   Cardiovascular: Negative for chest pain.  Gastrointestinal: Positive for constipation.       GERD  Endocrine: Positive for cold intolerance.  Musculoskeletal: Positive for back pain and joint swelling (right knee).  All other systems reviewed and are negative.   Past Medical History:  Diagnosis Date  . Arthritis   . Constipation - functional    FOR A LONG TIME  . Dysphagia 2015  . GERD (gastroesophageal reflux disease)     Past Surgical History:  Procedure Laterality Date  . ESOPHAGEAL DILATION N/A 09/03/2014   Procedure: ESOPHAGEAL DILATION;  Surgeon: West Bali, MD;  Location: [facility];  Service: Endoscopy;  Laterality: N/A;  . ESOPHAGOGASTRODUODENOSCOPY N/A 09/03/2014   YQI:[id number] gastritis/dysphagia due to distal esophagel stricture  . ESOPHAGOGASTRODUODENOSCOPY N/A 09/26/2014   GLO:VFIE non-erosive gastritis/stricture at the gastroesphageal  . KNEE SURGERY      Family History  Problem Relation Age of Onset  . Breast cancer Mother     DECEASED  . Colon polyps Neg Hx   . Colon cancer Neg Hx   . Stomach cancer Neg Hx   . Alzheimer's disease Father   . Heart disease Brother    . Lupus Sister     Social History Social History  Substance Use Topics  . Smoking status: Never Smoker  . Smokeless tobacco: Never Used  . Alcohol use 0.0 oz/week     Comment: very occasional    No Known Allergies  Current Outpatient Prescriptions  Medication Sig Dispense Refill  . cyclobenzaprine (FLEXERIL) 10 MG tablet Take 1 tablet (10 mg total) by mouth at bedtime. 30 tablet 3  . diclofenac (VOLTAREN) 75 MG EC tablet TAKE 1 TABLET (75 MG TOTAL) BY MOUTH 2 (TWO) TIMES DAILY WITH A MEAL. 60 tablet 5  . diphenhydrAMINE (BENADRYL) 25 MG tablet Take 25 mg by mouth every 6 (six) hours as needed for allergies. Reported on 08/21/2015    . estradiol (ALORA) 0.1 MG/24HR patch Place 1 patch (0.1 mg total) onto the skin 2 (two) times a week. 8 patch 12  . HYDROcodone-acetaminophen (NORCO) 7.5-325 MG tablet Take 1 tablet by mouth every 6 (six) hours as needed for moderate pain (Must last 30 days.Do not drive or operate machinery while taking this medicine.). 100 tablet 0  . omeprazole (PRILOSEC) 20 MG capsule TAKE 1 CAPSULE BY MOUTH 30 MINUTES PRIOR TO BREAKFAST AND SUPPER 60 capsule 11  . omeprazole (PRILOSEC) 20 MG capsule TAKE 1 CAPSULE BY MOUTH 30 MINUTES PRIOR TO BREAKFAST AND SUPPER 60 capsule 11  . predniSONE (STERAPRED UNI-PAK 21 TAB)  5 MG (21) TBPK tablet Take 6 pills first day; 5 pills second day; 4 pills third day; 3 pills fourth day; 2 pills next day and 1 pill last day. 21 tablet 0  . progesterone (PROMETRIUM) 200 MG capsule Take 1 capsule (200 mg total) by mouth daily. Take x 14 days each month (first 14 days) 42 capsule 3   No current facility-administered medications for this visit.      Physical Exam  Blood pressure (!) 136/97, pulse 90, temperature 97.3 F (36.3 C), height 5' 7.5" (1.715 m), weight 159 lb (72.1 kg), last menstrual period 07/02/2013.  Constitutional: overall normal hygiene, normal nutrition, well developed, normal grooming, normal body  habitus. Assistive device:none  Musculoskeletal: gait and station Limp right, muscle tone and strength are normal, no tremors or atrophy is present.  .  Neurological: coordination overall normal.  Deep tendon reflex/nerve stretch intact.  Sensation normal.  Cranial nerves II-XII intact.   Skin:   Normal overall no scars, lesions, ulcers or rashes. No psoriasis.  Psychiatric: Alert and oriented x 3.  Recent memory intact, remote memory unclear.  Normal mood and affect. Well groomed.  Good eye contact.  Cardiovascular: overall no swelling, no varicosities, no edema bilaterally, normal temperatures of the legs and arms, no clubbing, cyanosis and good capillary refill.  Lymphatic: palpation is normal.  Her right hand has swelling around the thenar area.  NV intact. ROM is full but tender. Grip is decreased.  Right knee is tender and has slight effusion. ROM 0 to 105  The patient has been educated about the nature of the problem(s) and counseled on treatment options.  The patient appeared to understand what I have discussed and is in agreement with it.  Encounter Diagnoses  Name Primary?  . Pain of right hand Yes  . Chronic midline low back pain without sciatica   . Chronic pain of right knee     PLAN Call if any problems.  Precautions discussed.  Continue current medications.   Return to clinic 1 month   I will give Rx for Prednisone.  A cock-up splint is given.  Out of work until Monday,.  Electronically Signed Darreld Mclean, MD 4/12/201810:53 AM

## 2016-10-16 ENCOUNTER — Other Ambulatory Visit: Payer: Self-pay | Admitting: Orthopaedic Surgery

## 2016-10-18 NOTE — Telephone Encounter (Signed)
Denied.  Not wise to continue antispasm drugs more than six weeks total.

## 2016-10-19 ENCOUNTER — Encounter: Payer: Self-pay | Admitting: Orthopedic Surgery

## 2016-10-19 ENCOUNTER — Telehealth: Payer: Self-pay | Admitting: Orthopaedic Surgery

## 2016-10-19 NOTE — Telephone Encounter (Signed)
Give note to be out of work.

## 2016-10-19 NOTE — Telephone Encounter (Signed)
Patient requests updated work note as states was unable to return to work yesterday, 10/18/16 -- states was not feeling well.  Would like note to include out through 10/18/16 and return to work today, 10/19/16. Patient has also applied for  intermittent FMLA as well.

## 2016-10-19 NOTE — Telephone Encounter (Signed)
Note issued to patient.

## 2016-10-27 ENCOUNTER — Telehealth: Payer: Self-pay | Admitting: Orthopaedic Surgery

## 2016-10-27 MED ORDER — HYDROCODONE-ACETAMINOPHEN 7.5-325 MG PO TABS: 1.0000 | ORAL_TABLET | Freq: Four times a day (QID) | ORAL | 0 refills | Status: DC | PRN

## 2016-11-11 ENCOUNTER — Ambulatory Visit: Payer: BLUE CROSS/BLUE SHIELD | Admitting: Orthopaedic Surgery

## 2016-11-12 ENCOUNTER — Ambulatory Visit (INDEPENDENT_AMBULATORY_CARE_PROVIDER_SITE_OTHER): Payer: BLUE CROSS/BLUE SHIELD | Admitting: Orthopaedic Surgery

## 2016-11-12 ENCOUNTER — Encounter: Payer: Self-pay | Admitting: Orthopaedic Surgery

## 2016-11-12 VITALS — BP 128/84 | HR 83 | Ht 67.5 in | Wt 164.0 lb

## 2016-11-12 DIAGNOSIS — M25561 Pain in right knee: Secondary | ICD-10-CM | POA: Diagnosis not present

## 2016-11-12 DIAGNOSIS — M545 Low back pain: Secondary | ICD-10-CM

## 2016-11-12 DIAGNOSIS — G8929 Other chronic pain: Secondary | ICD-10-CM | POA: Diagnosis not present

## 2016-11-12 MED ORDER — DICLOFENAC SODIUM 75 MG PO TBEC: 75.0000 mg | DELAYED_RELEASE_TABLET | Freq: Two times a day (BID) | ORAL | 5 refills | Status: DC

## 2016-11-12 NOTE — Progress Notes (Signed)
Patient UJ:[id number]:Sandra Davies, female DOB:11/23/1960, 56 y.o. FAO:[id number]  Chief Complaint  Patient presents with  . Knee Pain    follow up-doing about the same  . Hand Pain  . Back Pain    HPI  Sandra Davies is a 56 y.o. female who has pain chronically of the right knee and back.  She has no new trauma. She has no giving way of the knee, no redness but has swelling and popping.  Her lower back pain is stable with no sciatica.  She has been active and doing her exercises.  She has worked 21 straight days at work 12 hours a day. HPI  Body mass index is 25.31 kg/m.  ROS  Review of Systems  Constitutional:       Patient does not have Diabetes Mellitus. Patient does not have hypertension. Patient does not have COPD or shortness of breath. Patient does not have BMI > 35. Patient does not have current smoking history.   HENT: Negative for congestion.   Respiratory: Negative for cough and shortness of breath.   Cardiovascular: Negative for chest pain.  Gastrointestinal: Positive for constipation.       GERD  Endocrine: Positive for cold intolerance.  Musculoskeletal: Positive for back pain and joint swelling (right knee).  All other systems reviewed and are negative.   Past Medical History:  Diagnosis Date  . Arthritis   . Constipation - functional    FOR A LONG TIME  . Dysphagia 2015  . GERD (gastroesophageal reflux disease)     Past Surgical History:  Procedure Laterality Date  . ESOPHAGEAL DILATION N/A 09/03/2014   Procedure: ESOPHAGEAL DILATION;  Surgeon: West BaliSandi L Fields, MD;  Location: [facility];  Service: Endoscopy;  Laterality: N/A;  . ESOPHAGOGASTRODUODENOSCOPY N/A 09/03/2014   ONG:EXBMWUXLSLF:moderate gastritis/dysphagia due to distal esophagel stricture  . ESOPHAGOGASTRODUODENOSCOPY N/A 09/26/2014   KGM:WNUUSLF:mild non-erosive gastritis/stricture at the gastroesphageal  . KNEE SURGERY      Family History  Problem Relation Age of Onset  . Breast cancer Mother    DECEASED  . Colon polyps Neg Hx   . Colon cancer Neg Hx   . Stomach cancer Neg Hx   . Alzheimer's disease Father   . Heart disease Brother   . Lupus Sister     Social History Social History  Substance Use Topics  . Smoking status: Never Smoker  . Smokeless tobacco: Never Used  . Alcohol use 0.0 oz/week     Comment: very occasional    No Known Allergies  Current Outpatient Prescriptions  Medication Sig Dispense Refill  . cyclobenzaprine (FLEXERIL) 10 MG tablet Take 1 tablet (10 mg total) by mouth at bedtime. 30 tablet 3  . diclofenac (VOLTAREN) 75 MG EC tablet Take 1 tablet (75 mg total) by mouth 2 (two) times daily with a meal. 60 tablet 5  . diphenhydrAMINE (BENADRYL) 25 MG tablet Take 25 mg by mouth every 6 (six) hours as needed for allergies. Reported on 08/21/2015    . HYDROcodone-acetaminophen (NORCO) 7.5-325 MG tablet Take 1 tablet by mouth every 6 (six) hours as needed for moderate pain (Must last 30 days.Do not drive or operate machinery while taking this medicine.). 100 tablet 0  . omeprazole (PRILOSEC) 20 MG capsule TAKE 1 CAPSULE BY MOUTH 30 MINUTES PRIOR TO BREAKFAST AND SUPPER 60 capsule 11  . progesterone (PROMETRIUM) 200 MG capsule Take 1 capsule (200 mg total) by mouth daily. Take x 14 days each month (first 14 days) 42 capsule  3   No current facility-administered medications for this visit.      Physical Exam  Blood pressure 128/84, pulse 83, height 5' 7.5" (1.715 m), weight 164 lb (74.4 kg), last menstrual period 07/02/2013.  Constitutional: overall normal hygiene, normal nutrition, well developed, normal grooming, normal body habitus. Assistive device:none  Musculoskeletal: gait and station Limp right, muscle tone and strength are normal, no tremors or atrophy is present.  .  Neurological: coordination overall normal.  Deep tendon reflex/nerve stretch intact.  Sensation normal.  Cranial nerves II-XII intact.   Skin:   Normal overall no scars, lesions,  ulcers or rashes. No psoriasis.  Psychiatric: Alert and oriented x 3.  Recent memory intact, remote memory unclear.  Normal mood and affect. Well groomed.  Good eye contact.  Cardiovascular: overall no swelling, no varicosities, no edema bilaterally, normal temperatures of the legs and arms, no clubbing, cyanosis and good capillary refill.  Lymphatic: palpation is normal.  The right lower extremity is examined:  Inspection:  Thigh:  Non-tender and no defects  Knee has swelling 1+ effusion.                        Joint tenderness is present                        Patient is tender over the medial joint line  Lower Leg:  Has normal appearance and no tenderness or defects  Ankle:  Non-tender and no defects  Foot:  Non-tender and no defects Range of Motion:  Knee:  Range of motion is: 0-110                        Crepitus is  present  Ankle:  Range of motion is normal. Strength and Tone:  The right lower extremity has normal strength and tone. Stability:  Knee:  The knee is stable.  Ankle:  The ankle is stable.  Spine/Pelvis examination:  Inspection:  Overall, sacoiliac joint benign and hips nontender; without crepitus or defects.   Thoracic spine inspection: Alignment normal without kyphosis present   Lumbar spine inspection:  Alignment  with normal lumbar lordosis, without scoliosis apparent.   Thoracic spine palpation:  without tenderness of spinal processes   Lumbar spine palpation: with tenderness of lumbar area; without tightness of lumbar muscles    Range of Motion:   Lumbar flexion, forward flexion is 45 without pain or tenderness    Lumbar extension is full without pain or tenderness   Left lateral bend is Normal  without pain or tenderness   Right lateral bend is Normal without pain or tenderness   Straight leg raising is Normal   Strength & tone: Normal   Stability overall normal stability     The patient has been educated about the nature of the problem(s)  and counseled on treatment options.  The patient appeared to understand what I have discussed and is in agreement with it.  Encounter Diagnoses  Name Primary?  . Chronic midline low back pain without sciatica Yes  . Chronic pain of right knee     I refilled her diclofenac.  PLAN Call if any problems.  Precautions discussed.  Continue current medications.   Return to clinic 6 weeks   Electronically Signed Darreld Mclean, MD 5/11/20189:21 AM

## 2016-11-29 ENCOUNTER — Telehealth: Payer: Self-pay | Admitting: Orthopaedic Surgery

## 2016-11-30 ENCOUNTER — Encounter: Payer: Self-pay | Admitting: Orthopaedic Surgery

## 2016-11-30 ENCOUNTER — Ambulatory Visit (INDEPENDENT_AMBULATORY_CARE_PROVIDER_SITE_OTHER): Payer: BLUE CROSS/BLUE SHIELD | Admitting: Orthopaedic Surgery

## 2016-11-30 VITALS — BP 126/88 | HR 84 | Temp 97.2°F | Ht 67.5 in | Wt 164.0 lb

## 2016-11-30 DIAGNOSIS — G8929 Other chronic pain: Secondary | ICD-10-CM

## 2016-11-30 DIAGNOSIS — M545 Low back pain, unspecified: Secondary | ICD-10-CM

## 2016-11-30 DIAGNOSIS — M25561 Pain in right knee: Secondary | ICD-10-CM | POA: Diagnosis not present

## 2016-11-30 MED ORDER — HYDROCODONE-ACETAMINOPHEN 7.5-325 MG PO TABS: 1.0000 | ORAL_TABLET | Freq: Four times a day (QID) | ORAL | 0 refills | Status: DC | PRN

## 2016-11-30 NOTE — Progress Notes (Signed)
Patient QM:Sandra Davies [name], female DOB:[birth year], 56 y.o. MWU:[id number]  Chief Complaint  Patient presents with  . Follow-up    Chronic Back pain    HPI  Sandra Davies is a 56 y.o. female who has chronic lower back pain.  She is better.  She has less pain.  She has no paresthesias.  She has no weakness or bowel or bladder problems.    Her right knee is not tender today.  She has no giving way. HPI  Body mass index is 25.31 kg/m.  ROS  Review of Systems  Constitutional:       Patient does not have Diabetes Mellitus. Patient does not have hypertension. Patient does not have COPD or shortness of breath. Patient does not have BMI > 35. Patient does not have current smoking history.   HENT: Negative for congestion.   Respiratory: Negative for cough and shortness of breath.   Cardiovascular: Negative for chest pain.  Gastrointestinal: Positive for constipation.       GERD  Endocrine: Positive for cold intolerance.  Musculoskeletal: Positive for back pain and joint swelling (right knee).  All other systems reviewed and are negative.   Past Medical History:  Diagnosis Date  . Arthritis   . Constipation - functional    FOR A LONG TIME  . Dysphagia 2015  . GERD (gastroesophageal reflux disease)     Past Surgical History:  Procedure Laterality Date  . ESOPHAGEAL DILATION N/A 09/03/2014   Procedure: ESOPHAGEAL DILATION;  Surgeon: West Bali, MD;  Location: [facility];  Service: Endoscopy;  Laterality: N/A;  . ESOPHAGOGASTRODUODENOSCOPY N/A 09/03/2014   VOZ:DGUYQIHK gastritis/dysphagia due to distal esophagel stricture  . ESOPHAGOGASTRODUODENOSCOPY N/A 09/26/2014   VQQ:VZDG non-erosive gastritis/stricture at the gastroesphageal  . KNEE SURGERY      Family History  Problem Relation Age of Onset  . Breast cancer Mother        DECEASED  . Alzheimer's disease Father   . Heart disease Brother   . Lupus Sister   . Colon polyps Neg Hx   . Colon cancer Neg Hx    . Stomach cancer Neg Hx     Social History Social History  Substance Use Topics  . Smoking status: Never Smoker  . Smokeless tobacco: Never Used  . Alcohol use 0.0 oz/week     Comment: very occasional    No Known Allergies  Current Outpatient Prescriptions  Medication Sig Dispense Refill  . pseudoephedrine-acetaminophen (TYLENOL SINUS) 30-500 MG TABS tablet Take 1 tablet by mouth every 4 (four) hours as needed.    . cyclobenzaprine (FLEXERIL) 10 MG tablet Take 1 tablet (10 mg total) by mouth at bedtime. 30 tablet 3  . diclofenac (VOLTAREN) 75 MG EC tablet Take 1 tablet (75 mg total) by mouth 2 (two) times daily with a meal. 60 tablet 5  . diphenhydrAMINE (BENADRYL) 25 MG tablet Take 25 mg by mouth every 6 (six) hours as needed for allergies. Reported on 08/21/2015    . HYDROcodone-acetaminophen (NORCO) 7.5-325 MG tablet Take 1 tablet by mouth every 6 (six) hours as needed for moderate pain (Must last 30 days.Do not drive or operate machinery while taking this medicine.). 100 tablet 0  . omeprazole (PRILOSEC) 20 MG capsule TAKE 1 CAPSULE BY MOUTH 30 MINUTES PRIOR TO BREAKFAST AND SUPPER 60 capsule 11  . progesterone (PROMETRIUM) 200 MG capsule Take 1 capsule (200 mg total) by mouth daily. Take x 14 days each month (first 14 days) 42 capsule 3  No current facility-administered medications for this visit.      Physical Exam  Blood pressure 126/88, pulse 84, temperature 97.2 F (36.2 C), height 5' 7.5" (1.715 m), weight 164 lb (74.4 kg), last menstrual period 07/02/2013.  Constitutional: overall normal hygiene, normal nutrition, well developed, normal grooming, normal body habitus. Assistive device:none  Musculoskeletal: gait and station Limp none, muscle tone and strength are normal, no tremors or atrophy is present.  .  Neurological: coordination overall normal.  Deep tendon reflex/nerve stretch intact.  Sensation normal.  Cranial nerves II-XII intact.   Skin:   Normal  overall no scars, lesions, ulcers or rashes. No psoriasis.  Psychiatric: Alert and oriented x 3.  Recent memory intact, remote memory unclear.  Normal mood and affect. Well groomed.  Good eye contact.  Cardiovascular: overall no swelling, no varicosities, no edema bilaterally, normal temperatures of the legs and arms, no clubbing, cyanosis and good capillary refill.  Lymphatic: palpation is normal.  Spine/Pelvis examination:  Inspection:  Overall, sacoiliac joint benign and hips nontender; without crepitus or defects.   Thoracic spine inspection: Alignment normal without kyphosis present   Lumbar spine inspection:  Alignment  with normal lumbar lordosis, without scoliosis apparent.   Thoracic spine palpation:  without tenderness of spinal processes   Lumbar spine palpation: with tenderness of lumbar area; without tightness of lumbar muscles    Range of Motion:   Lumbar flexion, forward flexion is 45  without pain or tenderness    Lumbar extension is 10 without pain or tenderness   Left lateral bend is Normal  without pain or tenderness   Right lateral bend is Normal without pain or tenderness   Straight leg raising is Normal   Strength & tone: Normal   Stability overall normal stability     The patient has been educated about the nature of the problem(s) and counseled on treatment options.  The patient appeared to understand what I have discussed and is in agreement with it.  Encounter Diagnoses  Name Primary?  . Chronic midline low back pain without sciatica Yes  . Chronic pain of right knee     PLAN Call if any problems.  Precautions discussed.  Continue current medications.   Return to clinic 6 weeks  I have [place] [facility] [place] web site prior to prescribing narcotic medicine for this patient.   Electronically Signed Darreld McleanWayne Georgianne Gritz, MD 5/29/20181:53 PM

## 2016-12-30 ENCOUNTER — Telehealth: Payer: Self-pay | Admitting: Orthopaedic Surgery

## 2016-12-30 MED ORDER — HYDROCODONE-ACETAMINOPHEN 7.5-325 MG PO TABS: 1.0000 | ORAL_TABLET | Freq: Four times a day (QID) | ORAL | 0 refills | Status: DC | PRN

## 2017-01-11 ENCOUNTER — Ambulatory Visit (INDEPENDENT_AMBULATORY_CARE_PROVIDER_SITE_OTHER): Payer: BLUE CROSS/BLUE SHIELD | Admitting: Orthopaedic Surgery

## 2017-01-11 ENCOUNTER — Encounter: Payer: Self-pay | Admitting: Orthopaedic Surgery

## 2017-01-11 VITALS — BP 147/91 | HR 74 | Temp 97.0°F | Ht 67.5 in | Wt 163.0 lb

## 2017-01-11 DIAGNOSIS — M25561 Pain in right knee: Secondary | ICD-10-CM | POA: Diagnosis not present

## 2017-01-11 DIAGNOSIS — M545 Low back pain: Secondary | ICD-10-CM

## 2017-01-11 DIAGNOSIS — G8929 Other chronic pain: Secondary | ICD-10-CM | POA: Diagnosis not present

## 2017-01-11 NOTE — Progress Notes (Signed)
Patient Sandra Davies [name], female DOB:1961-06-30, 56 y.o. WJX:[id number]  Chief Complaint  Patient presents with  . Follow-up    back and knee pain    HPI  Sandra Davies is a 56 y.o. female who has chronic pain of the lower back and the right knee.  She is stable.  She has no new trauma, no paresthesias.  She has no giving way of the knee or locking.  She is active and working and doing her exercises. HPI  Body mass index is 25.15 kg/m.  ROS  Review of Systems  Constitutional:       Patient does not have Diabetes Mellitus. Patient does not have hypertension. Patient does not have COPD or shortness of breath. Patient does not have BMI > 35. Patient does not have current smoking history.   HENT: Negative for congestion.   Respiratory: Negative for cough and shortness of breath.   Cardiovascular: Negative for chest pain.  Gastrointestinal: Positive for constipation.       GERD  Endocrine: Positive for cold intolerance.  Musculoskeletal: Positive for back pain and joint swelling (right knee).  All other systems reviewed and are negative.   Past Medical History:  Diagnosis Date  . Arthritis   . Constipation - functional    FOR A LONG TIME  . Dysphagia 2015  . GERD (gastroesophageal reflux disease)     Past Surgical History:  Procedure Laterality Date  . ESOPHAGEAL DILATION N/A 09/03/2014   Procedure: ESOPHAGEAL DILATION;  Surgeon: West Bali, MD;  Location: [facility];  Service: Endoscopy;  Laterality: N/A;  . ESOPHAGOGASTRODUODENOSCOPY N/A 09/03/2014   OZH:YQMVHQIO gastritis/dysphagia due to distal esophagel stricture  . ESOPHAGOGASTRODUODENOSCOPY N/A 09/26/2014   NGE:XBMW non-erosive gastritis/stricture at the gastroesphageal  . KNEE SURGERY      Family History  Problem Relation Age of Onset  . Breast cancer Mother        DECEASED  . Alzheimer's disease Father   . Heart disease Brother   . Lupus Sister   . Colon polyps Neg Hx   . Colon cancer Neg  Hx   . Stomach cancer Neg Hx     Social History Social History  Substance Use Topics  . Smoking status: Never Smoker  . Smokeless tobacco: Never Used  . Alcohol use 0.0 oz/week     Comment: very occasional    No Known Allergies  Current Outpatient Prescriptions  Medication Sig Dispense Refill  . cyclobenzaprine (FLEXERIL) 10 MG tablet Take 1 tablet (10 mg total) by mouth at bedtime. 30 tablet 3  . diclofenac (VOLTAREN) 75 MG EC tablet Take 1 tablet (75 mg total) by mouth 2 (two) times daily with a meal. 60 tablet 5  . diphenhydrAMINE (BENADRYL) 25 MG tablet Take 25 mg by mouth every 6 (six) hours as needed for allergies. Reported on 08/21/2015    . HYDROcodone-acetaminophen (NORCO) 7.5-325 MG tablet Take 1 tablet by mouth every 6 (six) hours as needed for moderate pain (Must last 30 days.Do not drive or operate machinery while taking this medicine.). 100 tablet 0  . omeprazole (PRILOSEC) 20 MG capsule TAKE 1 CAPSULE BY MOUTH 30 MINUTES PRIOR TO BREAKFAST AND SUPPER 60 capsule 11  . progesterone (PROMETRIUM) 200 MG capsule Take 1 capsule (200 mg total) by mouth daily. Take x 14 days each month (first 14 days) 42 capsule 3  . pseudoephedrine-acetaminophen (TYLENOL SINUS) 30-500 MG TABS tablet Take 1 tablet by mouth every 4 (four) hours as needed.  No current facility-administered medications for this visit.      Physical Exam  Blood pressure (!) 147/91, pulse 74, temperature (!) 97 F (36.1 C), height 5' 7.5" (1.715 m), weight 163 lb (73.9 kg), last menstrual period 07/02/2013.  Constitutional: overall normal hygiene, normal nutrition, well developed, normal grooming, normal body habitus. Assistive device:none  Musculoskeletal: gait and station Limp right, muscle tone and strength are normal, no tremors or atrophy is present.  .  Neurological: coordination overall normal.  Deep tendon reflex/nerve stretch intact.  Sensation normal.  Cranial nerves II-XII intact.   Skin:    Normal overall no scars, lesions, ulcers or rashes. No psoriasis.  Psychiatric: Alert and oriented x 3.  Recent memory intact, remote memory unclear.  Normal mood and affect. Well groomed.  Good eye contact.  Cardiovascular: overall no swelling, no varicosities, no edema bilaterally, normal temperatures of the legs and arms, no clubbing, cyanosis and good capillary refill.  Lymphatic: palpation is normal.  The right lower extremity is examined:  Inspection:  Thigh:  Non-tender and no defects  Knee has swelling 1+ effusion.                        Joint tenderness is present                        Patient is tender over the medial joint line  Lower Leg:  Has normal appearance and no tenderness or defects  Ankle:  Non-tender and no defects  Foot:  Non-tender and no defects Range of Motion:  Knee:  Range of motion is: 0-110                        Crepitus is  present  Ankle:  Range of motion is normal. Strength and Tone:  The right lower extremity has normal strength and tone. Stability:  Knee:  The knee is stable.  Ankle:  The ankle is stable.  Spine/Pelvis examination:  Inspection:  Overall, sacoiliac joint benign and hips nontender; without crepitus or defects.   Thoracic spine inspection: Alignment normal without kyphosis present   Lumbar spine inspection:  Alignment  with normal lumbar lordosis, without scoliosis apparent.   Thoracic spine palpation:  without tenderness of spinal processes   Lumbar spine palpation: with tenderness of lumbar area; without tightness of lumbar muscles    Range of Motion:   Lumbar flexion, forward flexion is 45 without pain or tenderness    Lumbar extension is 10 without pain or tenderness   Left lateral bend is Normal  without pain or tenderness   Right lateral bend is Normal without pain or tenderness   Straight leg raising is Normal   Strength & tone: Normal   Stability overall normal stability      The patient has been educated  about the nature of the problem(s) and counseled on treatment options.  The patient appeared to understand what I have discussed and is in agreement with it.  Encounter Diagnoses  Name Primary?  . Chronic midline low back pain without sciatica Yes  . Chronic pain of right knee     PLAN Call if any problems.  Precautions discussed.  Continue current medications.   Return to clinic 3 months   Electronically Signed Darreld McleanWayne Reba Hulett, MD 7/10/20181:45 PM

## 2017-01-28 ENCOUNTER — Telehealth: Payer: Self-pay | Admitting: Orthopaedic Surgery

## 2017-02-01 MED ORDER — HYDROCODONE-ACETAMINOPHEN 7.5-325 MG PO TABS: 1.0000 | ORAL_TABLET | Freq: Four times a day (QID) | ORAL | 0 refills | Status: DC | PRN

## 2017-03-01 ENCOUNTER — Telehealth: Payer: Self-pay | Admitting: Orthopaedic Surgery

## 2017-03-01 MED ORDER — HYDROCODONE-ACETAMINOPHEN 7.5-325 MG PO TABS: 1.0000 | ORAL_TABLET | Freq: Four times a day (QID) | ORAL | 0 refills | Status: DC | PRN

## 2017-03-16 ENCOUNTER — Ambulatory Visit: Payer: BLUE CROSS/BLUE SHIELD | Admitting: Orthopaedic Surgery

## 2017-03-24 ENCOUNTER — Encounter: Payer: Self-pay | Admitting: Orthopaedic Surgery

## 2017-03-24 ENCOUNTER — Ambulatory Visit (INDEPENDENT_AMBULATORY_CARE_PROVIDER_SITE_OTHER): Payer: BLUE CROSS/BLUE SHIELD | Admitting: Orthopaedic Surgery

## 2017-03-24 VITALS — BP 148/88 | HR 77 | Temp 97.7°F | Ht 67.5 in | Wt 158.0 lb

## 2017-03-24 DIAGNOSIS — M25561 Pain in right knee: Secondary | ICD-10-CM

## 2017-03-24 DIAGNOSIS — G8929 Other chronic pain: Secondary | ICD-10-CM | POA: Diagnosis not present

## 2017-03-24 DIAGNOSIS — M545 Low back pain: Secondary | ICD-10-CM | POA: Diagnosis not present

## 2017-03-24 NOTE — Progress Notes (Signed)
Patient BJ:[id number] Sandra Davies, female DOB:May 25, 1961, 56 y.o. [id number]  Chief Complaint  Patient presents with  . Follow-up    knee and back    HPI  Sandra Davies is a 56 y.o. female who has chronic lower back and knee pain on the right.  She is stable.  She is working 7 days a week 12 hours a day.  She has no new trauma.  She is doing her exercises.  HPI  Body mass index is 24.38 kg/m.  ROS  Review of Systems  Constitutional:       Patient does not have Diabetes Mellitus. Patient does not have hypertension. Patient does not have COPD or shortness of breath. Patient does not have BMI > 35. Patient does not have current smoking history.   HENT: Negative for congestion.   Respiratory: Negative for cough and shortness of breath.   Cardiovascular: Negative for chest pain.  Gastrointestinal: Positive for constipation.       GERD  Endocrine: Positive for cold intolerance.  Musculoskeletal: Positive for back pain and joint swelling (right knee).  All other systems reviewed and are negative.   Past Medical History:  Diagnosis Date  . Arthritis   . Constipation - functional    FOR A LONG TIME  . Dysphagia 2015  . GERD (gastroesophageal reflux disease)     Past Surgical History:  Procedure Laterality Date  . ESOPHAGEAL DILATION N/A 09/03/2014   Procedure: ESOPHAGEAL DILATION;  Surgeon: West Bali, MD;  Location: [facility];  Service: Endoscopy;  Laterality: N/A;  . ESOPHAGOGASTRODUODENOSCOPY N/A 09/03/2014   BMW:UXLKGMWN gastritis/dysphagia due to distal esophagel stricture  . ESOPHAGOGASTRODUODENOSCOPY N/A 09/26/2014   UUV:OZDG non-erosive gastritis/stricture at the gastroesphageal  . KNEE SURGERY      Family History  Problem Relation Age of Onset  . Breast cancer Mother        DECEASED  . Alzheimer's disease Father   . Heart disease Brother   . Lupus Sister   . Colon polyps Neg Hx   . Colon cancer Neg Hx   . Stomach cancer Neg Hx     Social  History Social History  Substance Use Topics  . Smoking status: Never Smoker  . Smokeless tobacco: Never Used  . Alcohol use 0.0 oz/week     Comment: very occasional    No Known Allergies  Current Outpatient Prescriptions  Medication Sig Dispense Refill  . cyclobenzaprine (FLEXERIL) 10 MG tablet Take 1 tablet (10 mg total) by mouth at bedtime. 30 tablet 3  . diclofenac (VOLTAREN) 75 MG EC tablet Take 1 tablet (75 mg total) by mouth 2 (two) times daily with a meal. 60 tablet 5  . diphenhydrAMINE (BENADRYL) 25 MG tablet Take 25 mg by mouth every 6 (six) hours as needed for allergies. Reported on 08/21/2015    . HYDROcodone-acetaminophen (NORCO) 7.5-325 MG tablet Take 1 tablet by mouth every 6 (six) hours as needed for moderate pain (Must last 30 days.Do not drive or operate machinery while taking this medicine.). 100 tablet 0  . omeprazole (PRILOSEC) 20 MG capsule TAKE 1 CAPSULE BY MOUTH 30 MINUTES PRIOR TO BREAKFAST AND SUPPER 60 capsule 11  . progesterone (PROMETRIUM) 200 MG capsule Take 1 capsule (200 mg total) by mouth daily. Take x 14 days each month (first 14 days) 42 capsule 3  . pseudoephedrine-acetaminophen (TYLENOL SINUS) 30-500 MG TABS tablet Take 1 tablet by mouth every 4 (four) hours as needed.     No current facility-administered medications  for this visit.      Physical Exam  Blood pressure (!) 148/88, pulse 77, temperature 97.7 F (36.5 C), height 5' 7.5" (1.715 m), weight 158 lb (71.7 kg), last menstrual period 07/02/2013.  Constitutional: overall normal hygiene, normal nutrition, well developed, normal grooming, normal body habitus. Assistive device:none  Musculoskeletal: gait and station Limp right, muscle tone and strength are normal, no tremors or atrophy is present.  .  Neurological: coordination overall normal.  Deep tendon reflex/nerve stretch intact.  Sensation normal.  Cranial nerves II-XII intact.   Skin:   Normal overall no scars, lesions, ulcers or  rashes. No psoriasis.  Psychiatric: Alert and oriented x 3.  Recent memory intact, remote memory unclear.  Normal mood and affect. Well groomed.  Good eye contact.  Cardiovascular: overall no swelling, no varicosities, no edema bilaterally, normal temperatures of the legs and arms, no clubbing, cyanosis and good capillary refill.  Lymphatic: palpation is normal.  All other systems reviewed and are negative   The right lower extremity is examined:  Inspection:  Thigh:  Non-tender and no defects  Knee has swelling 1+ effusion.                        Joint tenderness is present                        Patient is tender over the medial joint line  Lower Leg:  Has normal appearance and no tenderness or defects  Ankle:  Non-tender and no defects  Foot:  Non-tender and no defects Range of Motion:  Knee:  Range of motion is: 0-110                        Crepitus is  present  Ankle:  Range of motion is normal. Strength and Tone:  The right lower extremity has normal strength and tone. Stability:  Knee:  The knee is stable.  Ankle:  The ankle is stable.   Spine/Pelvis examination:  Inspection:  Overall, sacoiliac joint benign and hips nontender; without crepitus or defects.   Thoracic spine inspection: Alignment normal without kyphosis present   Lumbar spine inspection:  Alignment  with normal lumbar lordosis, without scoliosis apparent.   Thoracic spine palpation:  without tenderness of spinal processes   Lumbar spine palpation: without tenderness of lumbar area; without tightness of lumbar muscles    Range of Motion:   Lumbar flexion, forward flexion is normal without pain or tenderness    Lumbar extension is full without pain or tenderness   Left lateral bend is normal without pain or tenderness   Right lateral bend is normal without pain or tenderness   Straight leg raising is normal  Strength & tone: normal   Stability overall normal stability The patient has been  educated about the nature of the problem(s) and counseled on treatment options.  The patient appeared to understand what I have discussed and is in agreement with it.  Encounter Diagnoses  Name Primary?  . Chronic midline low back pain without sciatica Yes  . Chronic pain of right knee     PLAN Call if any problems.  Precautions discussed.  Continue current medications.   Return to clinic 3 months   Electronically Signed Darreld Mclean, MD 9/20/20182:18 PM

## 2017-03-28 ENCOUNTER — Other Ambulatory Visit: Payer: Self-pay | Admitting: Orthopaedic Surgery

## 2017-04-04 ENCOUNTER — Other Ambulatory Visit: Payer: Self-pay | Admitting: Orthopedic Surgery

## 2017-04-04 MED ORDER — HYDROCODONE-ACETAMINOPHEN 7.5-325 MG PO TABS: 1.0000 | ORAL_TABLET | Freq: Four times a day (QID) | ORAL | 0 refills | Status: DC | PRN

## 2017-04-28 ENCOUNTER — Telehealth: Payer: Self-pay | Admitting: Orthopaedic Surgery

## 2017-04-28 MED ORDER — HYDROCODONE-ACETAMINOPHEN 7.5-325 MG PO TABS: 1.0000 | ORAL_TABLET | Freq: Four times a day (QID) | ORAL | 0 refills | Status: DC | PRN

## 2017-04-28 NOTE — Telephone Encounter (Signed)
Refill on Hydrocodone/Acetaminohen 7.5-325   Mgs.   Qty  100   Sig: Take 1 tablet by mouth every 6 (six) hours as needed for moderate pain (Must last 30 days.Do not drive or operate machinery while taking this medicine.).

## 2017-06-01 ENCOUNTER — Other Ambulatory Visit: Payer: Self-pay | Admitting: Orthopaedic Surgery

## 2017-06-01 MED ORDER — HYDROCODONE-ACETAMINOPHEN 7.5-325 MG PO TABS: 1.0000 | ORAL_TABLET | Freq: Four times a day (QID) | ORAL | 0 refills | Status: DC | PRN

## 2017-06-15 ENCOUNTER — Other Ambulatory Visit: Payer: Self-pay | Admitting: Orthopaedic Surgery

## 2017-06-23 ENCOUNTER — Encounter: Payer: Self-pay | Admitting: Orthopaedic Surgery

## 2017-06-23 ENCOUNTER — Ambulatory Visit: Payer: BLUE CROSS/BLUE SHIELD | Admitting: Orthopaedic Surgery

## 2017-06-23 VITALS — BP 140/92 | HR 74 | Temp 97.6°F | Ht 67.5 in | Wt 155.0 lb

## 2017-06-23 DIAGNOSIS — M25561 Pain in right knee: Secondary | ICD-10-CM | POA: Diagnosis not present

## 2017-06-23 DIAGNOSIS — M545 Low back pain: Secondary | ICD-10-CM

## 2017-06-23 DIAGNOSIS — G8929 Other chronic pain: Secondary | ICD-10-CM | POA: Diagnosis not present

## 2017-06-23 MED ORDER — HYDROCODONE-ACETAMINOPHEN 7.5-325 MG PO TABS: 1.0000 | ORAL_TABLET | Freq: Four times a day (QID) | ORAL | 0 refills | Status: DC | PRN

## 2017-06-23 NOTE — Progress Notes (Signed)
[name], female DOB:10/08/1960, 56 y.o. WJX:[id number]  Chief Complaint  Patient presents with  . Back Pain  . Knee Pain    right     HPI  [name] is a 56 y.o. female who has lower back pain that has gotten worse over the last few weeks.  She is working 12 hours shifts and feels she is overdoing it.  She will be off for Christmas.  She has no weakness.  She is doing her exercises and taking her medicine. HPI  Body mass index is 23.92 kg/m.  ROS  Review of Systems  Constitutional:       Patient does not have Diabetes Mellitus. Patient does not have hypertension. Patient does not have COPD or shortness of breath. Patient does not have BMI > 35. Patient does not have current smoking history.   HENT: Negative for congestion.   Respiratory: Negative for cough and shortness of breath.   Cardiovascular: Negative for chest pain.  Gastrointestinal: Positive for constipation.       GERD  Endocrine: Positive for cold intolerance.  Musculoskeletal: Positive for back pain and joint swelling (right knee).  All other systems reviewed and are negative.   Past Medical History:  Diagnosis Date  . Arthritis   . Constipation - functional    FOR A LONG TIME  . Dysphagia 2015  . GERD (gastroesophageal reflux disease)     Past Surgical History:  Procedure Laterality Date  . ESOPHAGEAL DILATION N/A 09/03/2014   Procedure: ESOPHAGEAL DILATION;  Surgeon: West BaliSandi L Fields, MD;  Location: [facility];  Service: Endoscopy;  Laterality: N/A;  . ESOPHAGOGASTRODUODENOSCOPY N/A 09/03/2014   OZH:YQMVHQIOSLF:moderate gastritis/dysphagia due to distal esophagel stricture  . ESOPHAGOGASTRODUODENOSCOPY N/A 09/26/2014   NGE:XBMWSLF:mild non-erosive gastritis/stricture at the gastroesphageal  . KNEE SURGERY      Family History  Problem Relation Age of Onset  . Breast cancer Mother        DECEASED  . Alzheimer's disease Father   . Heart disease Brother   . Lupus Sister   . Colon polyps  Neg Hx   . Colon cancer Neg Hx   . Stomach cancer Neg Hx     Social History Social History   Tobacco Use  . Smoking status: Never Smoker  . Smokeless tobacco: Never Used  Substance Use Topics  . Alcohol use: Yes    Alcohol/week: 0.0 oz    Comment: very occasional  . Drug use: No    No Known Allergies  Current Outpatient Medications  Medication Sig Dispense Refill  . cyclobenzaprine (FLEXERIL) 10 MG tablet Take 1 tablet (10 mg total) by mouth at bedtime. 30 tablet 3  . diclofenac (VOLTAREN) 75 MG EC tablet TAKE 1 TABLET BY MOUTH TWICE A DAY WITH FOOD 60 tablet 5  . diphenhydrAMINE (BENADRYL) 25 MG tablet Take 25 mg by mouth every 6 (six) hours as needed for allergies. Reported on 08/21/2015    . HYDROcodone-acetaminophen (NORCO) 7.5-325 MG tablet Take 1 tablet by mouth every 6 (six) hours as needed for moderate pain (Must last 30 days.Do not drive or operate machinery while taking this medicine.). 100 tablet 0  . omeprazole (PRILOSEC) 20 MG capsule TAKE 1 CAPSULE BY MOUTH 30 MINUTES PRIOR TO BREAKFAST AND SUPPER 60 capsule 11  . progesterone (PROMETRIUM) 200 MG capsule Take 1 capsule (200 mg total) by mouth daily. Take x 14 days each month (first 14 days) 42 capsule 3  . pseudoephedrine-acetaminophen (TYLENOL SINUS) 30-500 MG  TABS tablet Take 1 tablet by mouth every 4 (four) hours as needed.     No current facility-administered medications for this visit.      Physical Exam  Blood pressure (!) 140/92, pulse 74, temperature 97.6 F (36.4 C), height 5' 7.5" (1.715 m), weight 155 lb (70.3 kg), last menstrual period 07/02/2013.  Constitutional: overall normal hygiene, normal nutrition, well developed, normal grooming, normal body habitus. Assistive device:none  Musculoskeletal: gait and station Limp none, muscle tone and strength are normal, no tremors or atrophy is present.  .  Neurological: coordination overall normal.  Deep tendon reflex/nerve stretch intact.  Sensation  normal.  Cranial nerves II-XII intact.   Skin:   Normal overall no scars, lesions, ulcers or rashes. No psoriasis.  Psychiatric: Alert and oriented x 3.  Recent memory intact, remote memory unclear.  Normal mood and affect. Well groomed.  Good eye contact.  Cardiovascular: overall no swelling, no varicosities, no edema bilaterally, normal temperatures of the legs and arms, no clubbing, cyanosis and good capillary refill.  Lymphatic: palpation is normal.  All other systems reviewed and are negative   Spine/Pelvis examination:  Inspection:  Overall, sacoiliac joint benign and hips nontender; without crepitus or defects.   Thoracic spine inspection: Alignment normal without kyphosis present   Lumbar spine inspection:  Alignment  with normal lumbar lordosis, without scoliosis apparent.   Thoracic spine palpation:  without tenderness of spinal processes   Lumbar spine palpation: without tenderness of lumbar area; without tightness of lumbar muscles    Range of Motion:   Lumbar flexion, forward flexion is normal without pain or tenderness    Lumbar extension is full without pain or tenderness   Left lateral bend is normal without pain or tenderness   Right lateral bend is normal without pain or tenderness   Straight leg raising is normal  Strength & tone: normal   Stability overall normal stability The patient has been educated about the nature of the problem(s) and counseled on treatment options.  The patient appeared to understand what I have discussed and is in agreement with it.  Encounter Diagnoses  Name Primary?  . Chronic midline low back pain without sciatica Yes  . Chronic pain of right knee     PLAN Call if any problems.  Precautions discussed.  Continue current medications.   Return to clinic 2 months   I have reviewed the [facility] [place] Sub[place] web site prior to prescribing narcotic medicine for this patient.  Electronically  Signed Darreld McleanWayne Karl Knarr, MD 12/20/20181:43 PM

## 2017-07-26 ENCOUNTER — Encounter: Payer: Self-pay | Admitting: Orthopaedic Surgery

## 2017-07-28 ENCOUNTER — Telehealth: Payer: Self-pay | Admitting: Orthopaedic Surgery

## 2017-07-28 MED ORDER — HYDROCODONE-ACETAMINOPHEN 7.5-325 MG PO TABS: ORAL_TABLET | ORAL | 0 refills | Status: DC

## 2017-07-28 NOTE — Telephone Encounter (Signed)
Hydrocodone-Acetaminophen  7.5/325 mg  Qty  100 Tablets  Pt uses [facility]

## 2017-08-24 ENCOUNTER — Encounter: Payer: Self-pay | Admitting: Orthopaedic Surgery

## 2017-08-24 ENCOUNTER — Ambulatory Visit: Payer: BLUE CROSS/BLUE SHIELD | Admitting: Orthopaedic Surgery

## 2017-08-24 VITALS — BP 140/87 | HR 75 | Temp 97.8°F | Ht 67.5 in | Wt 158.0 lb

## 2017-08-24 DIAGNOSIS — M25561 Pain in right knee: Secondary | ICD-10-CM | POA: Diagnosis not present

## 2017-08-24 DIAGNOSIS — M545 Low back pain, unspecified: Secondary | ICD-10-CM

## 2017-08-24 DIAGNOSIS — G8929 Other chronic pain: Secondary | ICD-10-CM

## 2017-08-24 NOTE — Progress Notes (Signed)
BACK PA

## 2017-08-24 NOTE — Patient Instructions (Signed)
Out of work today and tomorrow. 

## 2017-08-24 NOTE — Progress Notes (Signed)
[name], female DOB:05/26/1961, 57 y.o. WJX:[id number]  Chief Complaint  Patient presents with  . Back Pain    LUMBAR    HPI  Sandra Davies is a 57 y.o. female who has chronic lower back pain with no paresthesias.  She has had increased pain the last week.  The cold rainy weather has made it worse. She has no new trauma, no weakness.  She is taking her medicine and doing her exercises. HPI  Body mass index is 24.38 kg/m.  ROS  Review of Systems  Constitutional:       Patient does not have Diabetes Mellitus. Patient does not have hypertension. Patient does not have COPD or shortness of breath. Patient does not have BMI > 35. Patient does not have current smoking history.   HENT: Negative for congestion.   Respiratory: Negative for cough and shortness of breath.   Cardiovascular: Negative for chest pain.  Gastrointestinal: Positive for constipation.       GERD  Endocrine: Positive for cold intolerance.  Musculoskeletal: Positive for back pain and joint swelling (right knee).  All other systems reviewed and are negative.   Past Medical History:  Diagnosis Date  . Arthritis   . Constipation - functional    FOR A LONG TIME  . Dysphagia 2015  . GERD (gastroesophageal reflux disease)     Past Surgical History:  Procedure Laterality Date  . ESOPHAGEAL DILATION N/A 09/03/2014   Procedure: ESOPHAGEAL DILATION;  Surgeon: West BaliSandi L Fields, MD;  [facility]: [facility];  Service: Endoscopy;  Laterality: N/A;  . ESOPHAGOGASTRODUODENOSCOPY N/A 09/03/2014   OZH:YQMVHQIOSLF:moderate gastritis/dysphagia due to distal esophagel stricture  . ESOPHAGOGASTRODUODENOSCOPY N/A 09/26/2014   NGE:XBMWSLF:mild non-erosive gastritis/stricture at the gastroesphageal  . KNEE SURGERY      Family History  Problem Relation Age of Onset  . Breast cancer Mother        DECEASED  . Alzheimer's disease Father   . Heart disease Brother   . Lupus Sister   . Colon polyps Neg Hx   . Colon cancer Neg  Hx   . Stomach cancer Neg Hx     Social History Social History   Tobacco Use  . Smoking status: Never Smoker  . Smokeless tobacco: Never Used  Substance Use Topics  . Alcohol use: Yes    Alcohol/week: 0.0 oz    Comment: very occasional  . Drug use: No    No Known Allergies  Current Outpatient Medications  Medication Sig Dispense Refill  . cyclobenzaprine (FLEXERIL) 10 MG tablet Take 1 tablet (10 mg total) by mouth at bedtime. 30 tablet 3  . diclofenac (VOLTAREN) 75 MG EC tablet TAKE 1 TABLET BY MOUTH TWICE A DAY WITH FOOD 60 tablet 5  . diphenhydrAMINE (BENADRYL) 25 MG tablet Take 25 mg by mouth every 6 (six) hours as needed for allergies. Reported on 08/21/2015    . HYDROcodone-acetaminophen (NORCO) 7.5-325 MG tablet One tablet every six hours as needed for pain.  30 day limit. 100 tablet 0  . omeprazole (PRILOSEC) 20 MG capsule TAKE 1 CAPSULE BY MOUTH 30 MINUTES PRIOR TO BREAKFAST AND SUPPER 60 capsule 11  . progesterone (PROMETRIUM) 200 MG capsule Take 1 capsule (200 mg total) by mouth daily. Take x 14 days each month (first 14 days) 42 capsule 3  . pseudoephedrine-acetaminophen (TYLENOL SINUS) 30-500 MG TABS tablet Take 1 tablet by mouth every 4 (four) hours as needed.     No current facility-administered medications for this visit.  Physical Exam  Blood pressure 140/87, pulse 75, temperature 97.8 F (36.6 C), height 5' 7.5" (1.715 m), weight 158 lb (71.7 kg), last menstrual period 07/02/2013.  Constitutional: overall normal hygiene, normal nutrition, well developed, normal grooming, normal body habitus. Assistive device:none  Musculoskeletal: gait and station Limp none, muscle tone and strength are normal, no tremors or atrophy is present.  .  Neurological: coordination overall normal.  Deep tendon reflex/nerve stretch intact.  Sensation normal.  Cranial nerves II-XII intact.   Skin:   Normal overall no scars, lesions, ulcers or rashes. No  psoriasis.  Psychiatric: Alert and oriented x 3.  Recent memory intact, remote memory unclear.  Normal mood and affect. Well groomed.  Good eye contact.  Cardiovascular: overall no swelling, no varicosities, no edema bilaterally, normal temperatures of the legs and arms, no clubbing, cyanosis and good capillary refill.  Lymphatic: palpation is normal.  Spine/Pelvis examination:  Inspection:  Overall, sacoiliac joint benign and hips nontender; without crepitus or defects.   Thoracic spine inspection: Alignment normal without kyphosis present   Lumbar spine inspection:  Alignment  with normal lumbar lordosis, without scoliosis apparent.   Thoracic spine palpation:  without tenderness of spinal processes   Lumbar spine palpation: with tenderness of lumbar area; with tightness of lumbar muscles    Range of Motion:   Lumbar flexion, forward flexion is 25 with pain or tenderness    Lumbar extension is 5 with pain or tenderness   Left lateral bend is Normal  with pain or tenderness   Right lateral bend is Normal with pain or tenderness   Straight leg raising is Normal   Strength & tone: Normal   Stability overall normal stability    All other systems reviewed and are negative   The patient has been educated about the nature of the problem(s) and counseled on treatment options.  The patient appeared to understand what I have discussed and is in agreement with it.  Encounter Diagnoses  Name Primary?  . Chronic midline low back pain without sciatica Yes  . Chronic pain of right knee     PLAN Call if any problems.  Precautions discussed.  Continue current medications.   Return to clinic 3 months   Out of work [employer] and tomorrow night.  Electronically Signed Darreld Mclean, MD 2/20/20191:39 PM

## 2017-08-25 ENCOUNTER — Telehealth: Payer: Self-pay | Admitting: Orthopaedic Surgery

## 2017-08-25 MED ORDER — PREDNISONE 5 MG (21) PO TBPK: ORAL_TABLET | ORAL | 0 refills | Status: DC

## 2017-08-25 NOTE — Telephone Encounter (Signed)
Patient states that she thought yesterday you were going to send in a prescription for Pred[facility]one? [facility]  She states they dont have anything for her.  Would you call this in?  [facility] [place]  Thanks

## 2017-08-29 ENCOUNTER — Other Ambulatory Visit: Payer: Self-pay | Admitting: Orthopaedic Surgery

## 2017-08-29 MED ORDER — HYDROCODONE-ACETAMINOPHEN 7.5-325 MG PO TABS: ORAL_TABLET | ORAL | 0 refills | Status: DC

## 2017-08-30 ENCOUNTER — Encounter: Payer: Self-pay | Admitting: Orthopaedic Surgery

## 2017-08-30 ENCOUNTER — Telehealth: Payer: Self-pay | Admitting: Orthopaedic Surgery

## 2017-08-30 NOTE — Telephone Encounter (Signed)
OK 

## 2017-08-30 NOTE — Telephone Encounter (Signed)
Patient states she needs another [facility] note.  We gave her one last week that stated she could return to work on Friday, 08/26/17 but she didn't go.  She wants a note to return back to work this Wednesday, 08/31/17  Is this okay?

## 2017-09-29 ENCOUNTER — Other Ambulatory Visit: Payer: Self-pay | Admitting: Orthopaedic Surgery

## 2017-09-29 MED ORDER — HYDROCODONE-ACETAMINOPHEN 7.5-325 MG PO TABS: ORAL_TABLET | ORAL | 0 refills | Status: DC

## 2017-11-02 ENCOUNTER — Other Ambulatory Visit: Payer: Self-pay | Admitting: Orthopedic Surgery

## 2017-11-02 MED ORDER — HYDROCODONE-ACETAMINOPHEN 7.5-325 MG PO TABS: ORAL_TABLET | ORAL | 0 refills | Status: DC

## 2017-11-23 ENCOUNTER — Ambulatory Visit: Payer: BLUE CROSS/BLUE SHIELD | Admitting: Orthopaedic Surgery

## 2017-11-23 ENCOUNTER — Encounter: Payer: Self-pay | Admitting: Orthopaedic Surgery

## 2017-11-23 VITALS — BP 138/88 | HR 73 | Ht 67.5 in | Wt 152.0 lb

## 2017-11-23 DIAGNOSIS — G8929 Other chronic pain: Secondary | ICD-10-CM | POA: Diagnosis not present

## 2017-11-23 DIAGNOSIS — M25561 Pain in right knee: Secondary | ICD-10-CM

## 2017-11-23 DIAGNOSIS — M545 Low back pain: Secondary | ICD-10-CM

## 2017-11-23 NOTE — Progress Notes (Signed)
Patient Sandra Davies [name], female DOB:[birth year], 57 y.o. NFA:[id number]  Chief Complaint  Patient presents with  . Follow-up    Low back pain    HPI  Sandra Davies is a 57 y.o. female who has chronic lower back pain and knee pain in the right.  She has been doing well with her knee recently.  She has some swelling and popping but the pain is less.  Her back has good and bad days. She works a 12 hour shift and her back is bothering her at the end of the shift.  She has no weakness, no trauma.  She is taking her medicine and doing her exercises. HPI  Body mass index is 23.46 kg/m.  ROS  Review of Systems  Constitutional:       Patient does not have Diabetes Mellitus. Patient does not have hypertension. Patient does not have COPD or shortness of breath. Patient does not have BMI > 35. Patient does not have current smoking history.   HENT: Negative for congestion.   Respiratory: Negative for cough and shortness of breath.   Cardiovascular: Negative for chest pain.  Gastrointestinal: Positive for constipation.       GERD  Endocrine: Positive for cold intolerance.  Musculoskeletal: Positive for back pain and joint swelling (right knee).  All other systems reviewed and are negative.   Past Medical History:  Diagnosis Date  . Arthritis   . Constipation - functional    FOR A LONG TIME  . Dysphagia 2015  . GERD (gastroesophageal reflux disease)     Past Surgical History:  Procedure Laterality Date  . ESOPHAGEAL DILATION N/A 09/03/2014   Procedure: ESOPHAGEAL DILATION;  Surgeon: West Bali, MD;  [facility];  Service: Endoscopy;  Laterality: N/A;  . ESOPHAGOGASTRODUODENOSCOPY N/A 09/03/2014   ION:GEXBMWUX gastritis/dysphagia due to distal esophagel stricture  . ESOPHAGOGASTRODUODENOSCOPY N/A 09/26/2014   LKG:MWNU non-erosive gastritis/stricture at the gastroesphageal  . KNEE SURGERY      Family History  Problem Relation Age of Onset  . Breast cancer  Mother        DECEASED  . Alzheimer's disease Father   . Heart disease Brother   . Lupus Sister   . Colon polyps Neg Hx   . Colon cancer Neg Hx   . Stomach cancer Neg Hx     Social History Social History   Tobacco Use  . Smoking status: Never Smoker  . Smokeless tobacco: Never Used  Substance Use Topics  . Alcohol use: Yes    Alcohol/week: 0.0 oz    Comment: very occasional  . Drug use: No    No Known Allergies  Current Outpatient Medications  Medication Sig Dispense Refill  . cyclobenzaprine (FLEXERIL) 10 MG tablet Take 1 tablet (10 mg total) by mouth at bedtime. 30 tablet 3  . diclofenac (VOLTAREN) 75 MG EC tablet TAKE 1 TABLET BY MOUTH TWICE A DAY WITH FOOD 60 tablet 5  . diphenhydrAMINE (BENADRYL) 25 MG tablet Take 25 mg by mouth every 6 (six) hours as needed for allergies. Reported on 08/21/2015    . HYDROcodone-acetaminophen (NORCO) 7.5-325 MG tablet One tablet every six hours as needed for pain.  30 day limit. 100 tablet 0  . omeprazole (PRILOSEC) 20 MG capsule TAKE 1 CAPSULE BY MOUTH 30 MINUTES PRIOR TO BREAKFAST AND SUPPER 60 capsule 11  . predniSONE (STERAPRED UNI-PAK 21 TAB) 5 MG (21) TBPK tablet Take 6 pills first day; 5 pills second day; 4 pills third day;  3 pills fourth day; 2 pills next day and 1 pill last day. 21 tablet 0  . progesterone (PROMETRIUM) 200 MG capsule Take 1 capsule (200 mg total) by mouth daily. Take x 14 days each month (first 14 days) 42 capsule 3  . pseudoephedrine-acetaminophen (TYLENOL SINUS) 30-500 MG TABS tablet Take 1 tablet by mouth every 4 (four) hours as needed.     No current facility-administered medications for this visit.      Physical Exam  Blood pressure 138/88, pulse 73, height 5' 7.5" (1.715 m), weight 152 lb (68.9 kg), last menstrual period 07/02/2013.  Constitutional: overall normal hygiene, normal nutrition, well developed, normal grooming, normal body habitus. Assistive device:none  Musculoskeletal: gait and station  Limp none, muscle tone and strength are normal, no tremors or atrophy is present.  .  Neurological: coordination overall normal.  Deep tendon reflex/nerve stretch intact.  Sensation normal.  Cranial nerves II-XII intact.   Skin:   Normal overall no scars, lesions, ulcers or rashes. No psoriasis.  Psychiatric: Alert and oriented x 3.  Recent memory intact, remote memory unclear.  Normal mood and affect. Well groomed.  Good eye contact.  Cardiovascular: overall no swelling, no varicosities, no edema bilaterally, normal temperatures of the legs and arms, no clubbing, cyanosis and good capillary refill.  Lymphatic: palpation is normal.  Spine/Pelvis examination:  Inspection:  Overall, sacoiliac joint benign and hips nontender; without crepitus or defects.   Thoracic spine inspection: Alignment normal without kyphosis present   Lumbar spine inspection:  Alignment  with normal lumbar lordosis, without scoliosis apparent.   Thoracic spine palpation:  without tenderness of spinal processes   Lumbar spine palpation: without tenderness of lumbar area; without tightness of lumbar muscles    Range of Motion:   Lumbar flexion, forward flexion is normal without pain or tenderness    Lumbar extension is full without pain or tenderness   Left lateral bend is normal without pain or tenderness   Right lateral bend is normal without pain or tenderness   Straight leg raising is normal  Strength & tone: normal   Stability overall normal stability  Right knee has ROM of 0 to 110, stable, crepitus and slight effusion.  NV intact.  All other systems reviewed and are negative   The patient has been educated about the nature of the problem(s) and counseled on treatment options.  The patient appeared to understand what I have discussed and is in agreement with it.  Encounter Diagnoses  Name Primary?  . Chronic midline low back pain without sciatica Yes  . Chronic pain of right knee     PLAN Call  if any problems.  Precautions discussed.  Continue current medications.   Return to clinic 3 months   Electronically Signed Darreld Mclean, MD 5/22/20191:36 PM

## 2017-11-23 NOTE — Progress Notes (Signed)
fo

## 2017-11-30 ENCOUNTER — Other Ambulatory Visit: Payer: Self-pay | Admitting: Orthopedic Surgery

## 2017-12-01 MED ORDER — HYDROCODONE-ACETAMINOPHEN 7.5-325 MG PO TABS: ORAL_TABLET | ORAL | 0 refills | Status: DC

## 2017-12-09 ENCOUNTER — Other Ambulatory Visit: Payer: Self-pay | Admitting: Orthopaedic Surgery

## 2017-12-20 ENCOUNTER — Encounter: Payer: Self-pay | Admitting: Orthopaedic Surgery

## 2017-12-20 ENCOUNTER — Ambulatory Visit (INDEPENDENT_AMBULATORY_CARE_PROVIDER_SITE_OTHER): Payer: BLUE CROSS/BLUE SHIELD

## 2017-12-20 ENCOUNTER — Ambulatory Visit: Payer: BLUE CROSS/BLUE SHIELD | Admitting: Orthopaedic Surgery

## 2017-12-20 VITALS — BP 140/94 | HR 107 | Ht 67.5 in | Wt 154.0 lb

## 2017-12-20 DIAGNOSIS — G8929 Other chronic pain: Secondary | ICD-10-CM

## 2017-12-20 DIAGNOSIS — M545 Low back pain: Secondary | ICD-10-CM

## 2017-12-20 NOTE — Progress Notes (Signed)
Patient Sandra Davies [name], female DOB:1961/03/11, 57 y.o. NFA:[id number]  Chief Complaint  Patient presents with  . Back Pain    lumbar    HPI  Sandra Davies is a 57 y.o. female who has had a flare up of lower back pain.  She has been out of work since June 10th.  She has marked pain on the right lower back with only slight paresthesias.  She has no trauma, no weakness, no bowel or bladder problems.  She called for appointment earlier but was given today.  She has been taking her pain medicine. HPI  Body mass index is 23.76 kg/m.  ROS  Review of Systems  Constitutional:       Patient does not have Diabetes Mellitus. Patient does not have hypertension. Patient does not have COPD or shortness of breath. Patient does not have BMI > 35. Patient does not have current smoking history.   HENT: Negative for congestion.   Respiratory: Negative for cough and shortness of breath.   Cardiovascular: Negative for chest pain.  Gastrointestinal: Positive for constipation.       GERD  Endocrine: Positive for cold intolerance.  Musculoskeletal: Positive for back pain and joint swelling (right knee).  All other systems reviewed and are negative.   Past Medical History:  Diagnosis Date  . Arthritis   . Constipation - functional    FOR A LONG TIME  . Dysphagia 2015  . GERD (gastroesophageal reflux disease)     Past Surgical History:  Procedure Laterality Date  . ESOPHAGEAL DILATION N/A 09/03/2014   Procedure: ESOPHAGEAL DILATION;  Surgeon: West Bali, MD;  [facility];  Service: Endoscopy;  Laterality: N/A;  . ESOPHAGOGASTRODUODENOSCOPY N/A 09/03/2014   ION:GEXBMWUX gastritis/dysphagia due to distal esophagel stricture  . ESOPHAGOGASTRODUODENOSCOPY N/A 09/26/2014   LKG:MWNU non-erosive gastritis/stricture at the gastroesphageal  . KNEE SURGERY      Family History  Problem Relation Age of Onset  . Breast cancer Mother        DECEASED  . Alzheimer's disease  Father   . Heart disease Brother   . Lupus Sister   . Colon polyps Neg Hx   . Colon cancer Neg Hx   . Stomach cancer Neg Hx     Social History Social History   Tobacco Use  . Smoking status: Never Smoker  . Smokeless tobacco: Never Used  Substance Use Topics  . Alcohol use: Yes    Alcohol/week: 0.0 oz    Comment: very occasional  . Drug use: No    No Known Allergies  Current Outpatient Medications  Medication Sig Dispense Refill  . cyclobenzaprine (FLEXERIL) 10 MG tablet Take 1 tablet (10 mg total) by mouth at bedtime. 30 tablet 3  . diclofenac (VOLTAREN) 75 MG EC tablet TAKE 1 TABLET BY MOUTH TWICE A DAY WITH FOOD 60 tablet 5  . diphenhydrAMINE (BENADRYL) 25 MG tablet Take 25 mg by mouth every 6 (six) hours as needed for allergies. Reported on 08/21/2015    . HYDROcodone-acetaminophen (NORCO) 7.5-325 MG tablet One tablet every six hours as needed for pain.  30 day limit. 100 tablet 0  . omeprazole (PRILOSEC) 20 MG capsule TAKE 1 CAPSULE BY MOUTH 30 MINUTES PRIOR TO BREAKFAST AND SUPPER 60 capsule 11  . predniSONE (STERAPRED UNI-PAK 21 TAB) 5 MG (21) TBPK tablet Take 6 pills first day; 5 pills second day; 4 pills third day; 3 pills fourth day; 2 pills next day and 1 pill last day. 21  tablet 0  . progesterone (PROMETRIUM) 200 MG capsule Take 1 capsule (200 mg total) by mouth daily. Take x 14 days each month (first 14 days) 42 capsule 3  . pseudoephedrine-acetaminophen (TYLENOL SINUS) 30-500 MG TABS tablet Take 1 tablet by mouth every 4 (four) hours as needed.     No current facility-administered medications for this visit.      Physical Exam  Blood pressure (!) 140/94, pulse (!) 107, height 5' 7.5" (1.715 m), weight 154 lb (69.9 kg), last menstrual period 07/02/2013.  Constitutional: overall normal hygiene, normal nutrition, well developed, normal grooming, normal body habitus. Assistive device:none  Musculoskeletal: gait and station Limp none, muscle tone and strength are  normal, no tremors or atrophy is present.  .  Neurological: coordination overall normal.  Deep tendon reflex/nerve stretch intact.  Sensation normal.  Cranial nerves II-XII intact.   Skin:   Normal overall no scars, lesions, ulcers or rashes. No psoriasis.  Psychiatric: Alert and oriented x 3.  Recent memory intact, remote memory unclear.  Normal mood and affect. Well groomed.  Good eye contact.  Cardiovascular: overall no swelling, no varicosities, no edema bilaterally, normal temperatures of the legs and arms, no clubbing, cyanosis and good capillary refill.  Lymphatic: palpation is normal.  Spine/Pelvis examination:  Inspection:  Overall, sacoiliac joint benign and hips nontender; without crepitus or defects.   Thoracic spine inspection: Alignment normal without kyphosis present   Lumbar spine inspection:  Alignment  with normal lumbar lordosis, without scoliosis apparent.   Thoracic spine palpation:  without tenderness of spinal processes   Lumbar spine palpation: with tenderness of lumbar area; with tightness of lumbar muscles    Range of Motion:   Lumbar flexion, forward flexion is 35 with pain or tenderness    Lumbar extension is 5 with pain or tenderness   Left lateral bend is Normal  without pain or tenderness   Right lateral bend is Normal without pain or tenderness   Straight leg raising is Normal   Strength & tone: Normal   Stability overall normal stability    All other systems reviewed and are negative   The patient has been educated about the nature of the problem(s) and counseled on treatment options.  The patient appeared to understand what I have discussed and is in agreement with it.  Encounter Diagnosis  Name Primary?  . Chronic midline low back pain without sciatica Yes   X-rays were done of the lumbar spine, reported separately.  PLAN Call if any problems.  Precautions discussed.  Continue current medications.   Return to clinic 2 weeks   Begin  PT.  Stay out of work.  Electronically Signed Darreld McleanWayne Rodriquez Thorner, MD 6/18/201910:55 AM

## 2017-12-20 NOTE — Patient Instructions (Signed)
OUT of WORK from July 10 until two weeks from now.

## 2017-12-26 ENCOUNTER — Other Ambulatory Visit: Payer: Self-pay

## 2017-12-26 ENCOUNTER — Ambulatory Visit (HOSPITAL_COMMUNITY): Payer: BLUE CROSS/BLUE SHIELD | Attending: Orthopaedic Surgery

## 2017-12-26 ENCOUNTER — Encounter (HOSPITAL_COMMUNITY): Payer: Self-pay

## 2017-12-26 DIAGNOSIS — M545 Low back pain: Secondary | ICD-10-CM | POA: Diagnosis not present

## 2017-12-26 DIAGNOSIS — G8929 Other chronic pain: Secondary | ICD-10-CM | POA: Diagnosis not present

## 2017-12-26 DIAGNOSIS — R29898 Other symptoms and signs involving the musculoskeletal system: Secondary | ICD-10-CM

## 2017-12-26 DIAGNOSIS — R2689 Other abnormalities of gait and mobility: Secondary | ICD-10-CM

## 2017-12-26 DIAGNOSIS — R293 Abnormal posture: Secondary | ICD-10-CM | POA: Diagnosis not present

## 2017-12-26 NOTE — Therapy (Signed)
[facility] [address] [place], [place], [address] Phone: [phone]   Fax:  [phone]  Physical Therapy Evaluation  Patient Details  Name: Sandra Davies MRN: [id number] Date of Birth: 25-Oct-1960 Referring Provider: Darreld Mclean MD   Encounter Date: 12/26/2017  PT End of Session - 12/26/17 1642    Visit Number  1    Number of Visits  13    Date for PT Re-Evaluation  02/06/18    Authorization Type  [employer] (60 visit limit (PT/OT/SLP), 1 eval per calender year, re-eval every 21 days) no auth required    Authorization Time Period  12/26/17 - 02/06/18    Authorization - Visit Number  1    Authorization - Number of Visits  60    PT Start Time  1515    PT Stop Time  1602    PT Time Calculation (min)  47 min    Activity Tolerance  Patient tolerated treatment well    Behavior During Therapy  [facility] for tasks assessed/performed       Past Medical History:  Diagnosis Date  . Arthritis   . Constipation - functional    FOR A LONG TIME  . Dysphagia 2015  . GERD (gastroesophageal reflux disease)     Past Surgical History:  Procedure Laterality Date  . ESOPHAGEAL DILATION N/A 09/03/2014   Procedure: ESOPHAGEAL DILATION;  Surgeon: West Bali, MD;  [facility];  Service: Endoscopy;  Laterality: N/A;  . ESOPHAGOGASTRODUODENOSCOPY N/A 09/03/2014   ZDG:UYQIHKVQ gastritis/dysphagia due to distal esophagel stricture  . ESOPHAGOGASTRODUODENOSCOPY N/A 09/26/2014   QVZ:DGLO non-erosive gastritis/stricture at the gastroesphageal  . KNEE SURGERY      There were no vitals filed for this visit.   Subjective Assessment - 12/26/17 1519    Subjective  Patient reports a chronic history of low back pain and has previously participated in physical therapy for her low back pain 1-2 years ago at our office. She reports she recently increased the amount of time she is spending at work from 8-hour days to 12-hour days and she is  working ~ 6-7 days per week. She has the late shift from 3PM to 3AM. 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 She has been taken out of work by her doctor since June 10th but is eager to be able to get back to work. She states at home she does not feel limited with daily activities like cooking/cleaning.    Limitations  Lifting;Standing;Walking;House hold activities;Other (comment) work    How long can you sit comfortably?  unlimited    How long can you stand comfortably?  concrete makes worse, when go to job    How long can you walk comfortably?  1/2 mile    Patient Stated Goals  ankle swells and goes down fron and back of leg.    Currently in Pain?  Yes    Pain Score  5     Pain Location  Back    Pain Orientation  Lower    Pain Descriptors / Indicators  Aching;Sore    Pain Type  Chronic pain    Pain Radiating Towards  down right LE, front and back    Pain Onset  More than a month ago  been going on the last couple months    Pain Frequency  Constant    Aggravating Factors   work    Pain Relieving Factors  sitting, pain medicine, heat (hot shower), cream blue star    Effect of Pain on Daily Activities  currently out of work        [facility] PT Assessment - 12/26/17 0001  12/26/17 0001  Assessment  Medical Diagnosis Low Back Pain  Referring Provider Darreld Mclean MD  Onset Date/Surgical Date  (approximately 2 months ago (flare up))  Next MD Visit 01/04/18  Prior Therapy yes, 1-2 years ago here for LBP  Precautions  Precautions None  Restrictions  Weight Bearing Restrictions No  Balance Screen  Has the patient fallen in the past 6 months No  Has the patient had a decrease in activity level because of a fear of falling?  No  Is the patient reluctant to leave their home because of a fear of falling?   No  Home Environment  Living Environment Private residence  Living Arrangements Non-relatives/Friends  Available Help at Discharge Friend(s)  Type of Home House  Home Access Stairs to enter  Entrance Stairs-Number of Steps 3  Entrance Stairs-Rails None  Home Layout One level  Home Equipment None  Prior Function  Level of Independence Independent with basic ADLs;Independent  Cognition  Overall Cognitive Status Within Functional Limits for tasks assessed  Observation/Other Assessments  Focus on Therapeutic Outcomes (FOTO)  50% limited  Functional Tests  Functional tests Squat;Single leg stance;Other  Squat  Comments NBOS, knee valus bil, excesive forward trunk lean, anterior transaltiong of knee beyond toes  Single Leg Stance  Comments Rt LE = 12; Lt LE = 26  Other:  Other/ Comments Lifting mechanics: patient with excessive trunk flexion and decreased squat depth. Poor lifting to chest heigh and close to body for safety.   Posture/Postural Control  Posture/Postural Control Postural limitations  Postural Limitations Rounded Shoulders;Forward head;Increased lumbar lordosis  ROM / Strength  AROM / PROM / Strength AROM;Strength  AROM  AROM Assessment Site Lumbar  Lumbar Flexion 38  Lumbar Extension 14  Lumbar - Right Side Bend 18  Lumbar - Left Side Bend 22  Lumbar - Right Rotation 6  Lumbar - Left Rotation 8  Strength  Strength Assessment Site Hip;Knee;Ankle  Right/Left Knee Right;Left  Right Hip Flexion 4+/5  Right Hip Extension 4+/5  Right Hip ABduction 4+/5  Left Hip Flexion 4+/5  Left Hip Extension 4+/5  Left Hip ABduction 4+/5  Right Knee Flexion  4/5  Right Knee Extension 4+/5  Left Knee Flexion 4+/5  Left Knee Extension 4+/5  Right Ankle Dorsiflexion 4+/5  Left Ankle Dorsiflexion 4+/5  Palpation  Spinal mobility Hypomobile alone lumbar spine and tender along thoracic and lumbar spine  Palpation comment tenderness along paraspinals and gluteus maximus bil  lower back, Rt is more tender than Lt  Special Tests   Special Tests Lumbar  Lumbar Tests Slump Test;Straight Leg Raise  Straight Leg Raise  Findings Negative  Side  Rt  Ambulation/Gait  Ambulation/Gait Yes  Ambulation/Gait Assistance 7: Independent  Ambulation Distance (Feet) 286 Feet  Assistive device None  Gait Pattern WFL  Ambulation Surface Level;Indoor  Gait velocity 0.72 m/s  Stairs Yes  Stairs Assistance 7: Independent  Stair Management Technique One rail Left;Forwards;Step to pattern  Number of Stairs 4  Height of Stairs 6  Gait Comments step ups with Lt LE, steps down with Rt LE    Objective  measurements completed on examination: See above findings.     12/26/17 0001  Exercises  Exercises Lumbar  Lumbar Exercises: Stretches  Single Knee to Chest Stretch Right;Left;30 seconds;2 reps     PT Education - 12/26/17 1530    Education Details  Educated on overall exam findings and on initial HEP for lumbar stretching/ROM.    Person(s) Educated  Patient    Methods  Explanation    Comprehension  Verbalized understanding       PT Short Term Goals - 12/27/17 0858      PT SHORT TERM GOAL #1   Title  Patient will be independent with HEP, updated PRN, to improve posture, strength, and body mechanics to be abel to return to work tasks with decreased pain.    Time  2    Period  Weeks    Status  New    Target Date  01/09/18      PT SHORT TERM GOAL #2   Title  Patient will improve ROM for all limited directions by 8 degrees or up to WNL's to demosntrte significant improvement in ROM to be abel to perform work activities with proper form/body mechanics    Time  3    Period  Weeks    Status  New    Target Date  01/16/18      PT SHORT TERM GOAL #3   Title  Patient will demonstrate safe/proper body mechanics with squat and lifting technique to improve safety at work and reduce stress on low back.    Time  3    Period  Weeks    Status  New      PT SHORT TERM GOAL #4    Title  Patient will improve SLS to 30 seconds on bil LE's to imprvoe safety wtih gait and stair mobiltiy and demonstrate improved balance awareness/procpriocetion for Rt LE.    Time  3    Period  Weeks    Status  New        PT Long Term Goals - 12/26/17 1540      PT LONG TERM GOAL #1   Title  Patient will improve FOTO by 12% or more to demosntrate significant improvement in self reported functional performance and decreased limitations.    Time  6    Period  Weeks    Status  New    Target Date  02/06/18      PT LONG TERM GOAL #2   Title  Patient will demonstrate safe/proper body mechanics with lifting and turning to move heavy glass items at work as well as proper mechanics with pushing/pulling crates to decrease pain and improve safety with return to work.    Time  6    Period  Weeks    Status  New      PT LONG TERM GOAL #3   Title  Patient will improve posture while walking and sitting throughout the day at least 50% of her day to improve postural endurance muscel activation to reduce strain on low back.    Time  6    Period  Weeks    Status  New      PT LONG TERM GOAL #4   Title  Patient will have reduced tenderness to PA's of lumbar spine and palpation of paraspinals as well as no muscle guarding with palpation to show reduce hypertonicity and hypersensitivity of lumbar endurance muscles.    Time  6    Period  Weeks  Status  New       Plan - 12/27/17 1610    Clinical Impression Statement  Patient presents for outpatient physical therapy evaluation for recent exacerbation of low back pain. She is currently out of work and feels work does contribute to increasing her back pain. She has prior successful experience with physical therapy to address her back pain and her goal is to return to work with less pain and improved mechanics to continue working safely. She presents with limited ROM, decreased strength, hyperactivity of lumbar paraspinals, impaired posture, poor body  mechanics with functional activities (lifting, squats), decreased endurance, hypomobility of spinal joints, and pain. She will benefit from skilled PT interventions to address current impairments and progress towards goals to return to work safely.    Clinical Presentation  Stable    Clinical Presentation due to:  FOTO, MMT, ROM, SLS, , clinical judgement    Clinical Decision Making  Low    Rehab Potential  Good    PT Frequency  2x / week    PT Duration  6 weeks    PT Treatment/Interventions  ADLs/Self Care Home Management;Aquatic Therapy;Electrical Stimulation;Moist Heat;Cryotherapy;Stair training;Functional mobility training;Therapeutic activities;Balance training;Therapeutic exercise;Neuromuscular re-education;Patient/family education;Manual techniques;Passive range of motion;Dry needling    PT Next Visit Plan  Review evaluation and goals. Initiate PA's to lumbar and thoracic spine if with PT. Begin postural strengthening in sitting/standing. Review squat form and lifting mechanics, continue with lumbar stretching.    PT Home Exercise Plan  Eval: [facility];     Consulted and Agree with Plan of Care  Patient       Patient will benefit from skilled therapeutic intervention in order to improve the following deficits and impairments:     Visit Diagnosis: Chronic midline low back pain, with sciatica presence unspecified  Abnormal posture  Other abnormalities of gait and mobility  Other symptoms and signs involving the musculoskeletal system     Problem List Patient Active Problem List   Diagnosis Date Noted  . Midline low back pain without sciatica 11/06/2015  . Menopausal symptoms 04/09/2015  . Dysphagia, pharyngoesophageal phase   . Dysphagia 08/21/2014  . Constipation 08/21/2014  . IRREGULAR MENSTRUAL CYCLE 11/15/2007  . VERTIGO, CHRONIC 11/15/2007  . HEADACHE 11/15/2007  . SYNCOPE, HX OF 11/15/2007    Valentino Saxon, PT, DPT Physical Therapist with [facility]  12/27/2017 1:41 PM    [facility] [address] [place], [place], [address] Phone: [phone]   Fax:  [phone]  Name: Sandra Davies MRN: [id number] Date of Birth: [birth year]

## 2017-12-28 ENCOUNTER — Encounter (HOSPITAL_COMMUNITY): Payer: Self-pay

## 2017-12-28 ENCOUNTER — Ambulatory Visit (HOSPITAL_COMMUNITY): Payer: BLUE CROSS/BLUE SHIELD

## 2017-12-28 ENCOUNTER — Other Ambulatory Visit: Payer: Self-pay

## 2017-12-28 DIAGNOSIS — R29898 Other symptoms and signs involving the musculoskeletal system: Secondary | ICD-10-CM

## 2017-12-28 DIAGNOSIS — M545 Low back pain: Principal | ICD-10-CM

## 2017-12-28 DIAGNOSIS — R2689 Other abnormalities of gait and mobility: Secondary | ICD-10-CM

## 2017-12-28 DIAGNOSIS — G8929 Other chronic pain: Secondary | ICD-10-CM | POA: Diagnosis not present

## 2017-12-28 DIAGNOSIS — R293 Abnormal posture: Secondary | ICD-10-CM | POA: Diagnosis not present

## 2017-12-28 NOTE — Therapy (Signed)
[facility] [address] [place], [place], [address] Phone: [phone]   Fax:  [phone]  Physical Therapy Treatment  Patient Details  Name: Sandra Davies MRN: [id number] Date of Birth: 1961/04/30 Referring Provider: Darreld Mclean MD   Encounter Date: 12/28/2017  PT End of Session - 12/28/17 1326    Visit Number  2    Number of Visits  13    Date for PT Re-Evaluation  02/06/18    Authorization Type  [employer] blue [employer] (60 visit limit (PT/OT/SLP), 1 eval per calender year, re-eval every 21 days) no auth required    Authorization Time Period  12/26/17 - 02/06/18    Authorization - Visit Number  2    Authorization - Number of Visits  60    PT Start Time  1310    PT Stop Time  1350    PT Time Calculation (min)  40 min    Activity Tolerance  Patient tolerated treatment well    Behavior During Therapy  [facility] for tasks assessed/performed       Past Medical History:  Diagnosis Date  . Arthritis   . Constipation - functional    FOR A LONG TIME  . Dysphagia 2015  . GERD (gastroesophageal reflux disease)     Past Surgical History:  Procedure Laterality Date  . ESOPHAGEAL DILATION N/A 09/03/2014   Procedure: ESOPHAGEAL DILATION;  Surgeon: West Bali, MD;  [facility];  Service: Endoscopy;  Laterality: N/A;  . ESOPHAGOGASTRODUODENOSCOPY N/A 09/03/2014   ONG:EXBMWUXL gastritis/dysphagia due to distal esophagel stricture  . ESOPHAGOGASTRODUODENOSCOPY N/A 09/26/2014   KGM:WNUU non-erosive gastritis/stricture at the gastroesphageal  . KNEE SURGERY      There were no vitals filed for this visit.  Subjective Assessment - 12/28/17 1318    Subjective  Patient arrives reporting she is feeling sore today as she has been working around her house packing. She reports she did her exercise at home and it felt good. She states she is not having pain down her leg today.    Limitations  Lifting;Standing;Walking;House hold  activities;Other (comment) work    How long can you sit comfortably?  unlimited    How long can you stand comfortably?  concrete makes worse, when go to job    How long can you walk comfortably?  1/2 mile    Patient Stated Goals  ankle swells and goes down fron and back of leg.    Currently in Pain?  Yes    Pain Score  5     Pain Location  Back    Pain Orientation  Lower    Pain Descriptors / Indicators  Aching;Sore    Pain Type  Chronic pain    Pain Onset  More than a month ago been going on the last couple months    Pain Frequency  Constant    Aggravating Factors   work moving things/packing around the house    Pain Relieving Factors  sitting, rest, medicine        [facility] Adult PT Treatment/Exercise - 12/28/17 0001      Lumbar Exercises: Stretches   Lower Trunk Rotation  5 reps;10 seconds;Limitations    Lower Trunk Rotation Limitations  bilaterally    Other Lumbar Stretch Exercise  Child's pose: 3x 10 secodns with walking to Rt/Lt with hands      Lumbar Exercises: Machines for Strengthening   Other Lumbar Machine Exercise  UBE: 4 minutes retrograde for postural  strengthening, level 1      Lumbar Exercises: Standing   Scapular Retraction  Strengthening;Both;15 reps;Theraband    Theraband Level (Scapular Retraction)  Level 2 (Red)    Row  Strengthening;15 reps;Both;Theraband    Theraband Level (Row)  Level 2 (Red)    Shoulder Extension  Strengthening;Both;15 reps;Theraband    Theraband Level (Shoulder Extension)  Level 2 (Red)      Lumbar Exercises: Seated   Other Seated Lumbar Exercises  Mone/bil UE external shoulder rotation: 2x 15 reps with red theraband        PT Education - 12/28/17 1325    Education Details  Reviewed evaluation and goals. Educated on exercises throughout and updated HEP.    Person(s) Educated  Patient    Methods  Explanation    Comprehension  Verbalized understanding       PT Short Term Goals - 12/28/17 1327      PT SHORT TERM GOAL #1   Title   Patient will be independent with HEP, updated PRN, to improve posture, strength, and body mechanics to be abel to return to work tasks with decreased pain.    Time  2    Period  Weeks    Status  On-going      PT SHORT TERM GOAL #2   Title  Patient will improve ROM for all limited directions by 8 degrees or up to WNL's to demosntrte significant improvement in ROM to be abel to perform work activities with proper form/body mechanics    Time  3    Period  Weeks    Status  On-going      PT SHORT TERM GOAL #3   Title  Patient will demonstrate safe/proper body mechanics with squat and lifting technique to improve safety at work and reduce stress on low back.    Time  3    Period  Weeks    Status  On-going      PT SHORT TERM GOAL #4   Title  Patient will improve SLS to 30 seconds on bil LE's to imprvoe safety wtih gait and stair mobiltiy and demonstrate improved balance awareness/procpriocetion for Rt LE.    Time  3    Period  Weeks    Status  On-going        PT Long Term Goals - 12/28/17 1327      PT LONG TERM GOAL #1   Title  Patient will improve FOTO by 12% or more to demosntrate significant improvement in self reported functional performance and decreased limitations.    Time  6    Period  Weeks    Status  On-going      PT LONG TERM GOAL #2   Title  Patient will demonstrate safe/proper body mechanics with lifting and turning to move heavy glass items at work as well as proper mechanics with pushing/pulling crates to decrease pain and improve safety with return to work.    Time  6    Period  Weeks    Status  On-going      PT LONG TERM GOAL #3   Title  Patient will improve posture while walking and sitting throughout the day at least 50% of her day to improve postural endurance muscel activation to reduce strain on low back.    Time  6    Period  Weeks    Status  On-going      PT LONG TERM GOAL #4   Title  Patient will have reduced  tenderness to PA's of lumbar spine and  palpation of paraspinals as well as no muscle guarding with palpation to show reduce hypertonicity and hypersensitivity of lumbar endurance muscles.    Time  6    Period  Weeks    Status  On-going       Plan - 12/28/17 1327    Clinical Impression Statement  Session began with review of evaluation and goals. Postural strengthening initiated today and aptient requried minimal verbal/visual cues to achieve proper posture for exercises. Functional squat trainign was initiated today with wall squats as well and she was educated on lumbar stretches. Patient has reported compliance with HEP and it has been updated to include postural training exercises. She will continue to benefit from skilled PT interventiosn to address impairments and progress towards goals to return to work.    Rehab Potential  Good    PT Frequency  2x / week    PT Duration  6 weeks    PT Treatment/Interventions  ADLs/Self Care Home Management;Aquatic Therapy;Electrical Stimulation;Moist Heat;Cryotherapy;Stair training;Functional mobility training;Therapeutic activities;Balance training;Therapeutic exercise;Neuromuscular re-education;Patient/family education;Manual techniques;Passive range of motion;Dry needling    PT Next Visit Plan  Initiate PA's to lumbar and thoracic spine if with PT. Continue postural strengthening in sitting/standing. Continue with functional squat training. Review lifting mechanics, continue with lumbar stretching.    PT Home Exercise Plan  Eval: SKTC;     Consulted and Agree with Plan of Care  Patient       Patient will benefit from skilled therapeutic intervention in order to improve the following deficits and impairments:  Decreased activity tolerance, Decreased endurance, Decreased range of motion, Decreased strength, Improper body mechanics, Pain, Hypomobility, Decreased balance, Decreased mobility, Postural dysfunction, Impaired flexibility  Visit Diagnosis: Chronic midline low back pain, with  sciatica presence unspecified  Abnormal posture  Other abnormalities of gait and mobility  Other symptoms and signs involving the musculoskeletal system     Problem List Patient Active Problem List   Diagnosis Date Noted  . Midline low back pain without sciatica 11/06/2015  . Menopausal symptoms 04/09/2015  . Dysphagia, pharyngoesophageal phase   . Dysphagia 08/21/2014  . Constipation 08/21/2014  . IRREGULAR MENSTRUAL CYCLE 11/15/2007  . VERTIGO, CHRONIC 11/15/2007  . HEADACHE 11/15/2007  . SYNCOPE, HX OF 11/15/2007    Valentino Saxon, PT, DPT Physical Therapist with [facility]  12/28/2017 1:48 PM    [place] [facility] [address] [place], [place], [address] Phone: [phone]   Fax:  [phone]  Name: Sandra Davies MRN: [id number] Date of Birth: Feb 05, 1961

## 2017-12-29 ENCOUNTER — Other Ambulatory Visit: Payer: Self-pay | Admitting: Orthopaedic Surgery

## 2017-12-29 MED ORDER — HYDROCODONE-ACETAMINOPHEN 7.5-325 MG PO TABS: ORAL_TABLET | ORAL | 0 refills | Status: DC

## 2018-01-02 ENCOUNTER — Telehealth (HOSPITAL_COMMUNITY): Payer: Self-pay | Admitting: Internal Medicine

## 2018-01-02 ENCOUNTER — Ambulatory Visit (HOSPITAL_COMMUNITY): Payer: BLUE CROSS/BLUE SHIELD | Admitting: Physical Therapy

## 2018-01-02 NOTE — Telephone Encounter (Signed)
01/02/18  pt left a message to cx said that she has a stomach virus

## 2018-01-03 ENCOUNTER — Ambulatory Visit: Payer: BLUE CROSS/BLUE SHIELD | Admitting: Orthopaedic Surgery

## 2018-01-03 ENCOUNTER — Encounter: Payer: Self-pay | Admitting: Orthopaedic Surgery

## 2018-01-03 VITALS — BP 153/96 | HR 82 | Temp 98.1°F | Ht 67.5 in | Wt 157.0 lb

## 2018-01-03 DIAGNOSIS — G8929 Other chronic pain: Secondary | ICD-10-CM | POA: Diagnosis not present

## 2018-01-03 DIAGNOSIS — M545 Low back pain: Secondary | ICD-10-CM | POA: Diagnosis not present

## 2018-01-03 MED ORDER — CYCLOBENZAPRINE HCL 10 MG PO TABS: 10.0000 mg | ORAL_TABLET | Freq: Every day | ORAL | 3 refills | Status: DC

## 2018-01-03 NOTE — Progress Notes (Signed)
Patient WU:[id number]:Sandra Davies, female DOB:03/12/1961, 57 y.o. NFA:[id number]  Chief Complaint  Patient presents with  . Back Pain    HPI  [name] is a 57 y.o. female who has lower back pain.  She is going to PT.  She still has pain but is a little better.  She has been to PT twice, I have reviewed the notes.  She has no new trauma, no weakness.  She is taking her medicine.   Body mass index is 24.23 kg/m.  ROS  Review of Systems  Constitutional:       Patient does not have Diabetes Mellitus. Patient does not have hypertension. Patient does not have COPD or shortness of breath. Patient does not have BMI > 35. Patient does not have current smoking history.   HENT: Negative for congestion.   Respiratory: Negative for cough and shortness of breath.   Cardiovascular: Negative for chest pain.  Gastrointestinal: Positive for constipation.       GERD  Endocrine: Positive for cold intolerance.  Musculoskeletal: Positive for back pain and joint swelling (right knee).  All other systems reviewed and are negative.   All other systems reviewed and are negative.  Past Medical History:  Diagnosis Date  . Arthritis   . Constipation - functional    FOR A LONG TIME  . Dysphagia 2015  . GERD (gastroesophageal reflux disease)     Past Surgical History:  Procedure Laterality Date  . ESOPHAGEAL DILATION N/A 09/03/2014   Procedure: ESOPHAGEAL DILATION;  Surgeon: West BaliSandi L Fields, MD;  [facility];  Service: Endoscopy;  Laterality: N/A;  . ESOPHAGOGASTRODUODENOSCOPY N/A 09/03/2014   ION:GEXBMWUXSLF:moderate gastritis/dysphagia due to distal esophagel stricture  . ESOPHAGOGASTRODUODENOSCOPY N/A 09/26/2014   LKG:MWNUSLF:mild non-erosive gastritis/stricture at the gastroesphageal  . KNEE SURGERY      Family History  Problem Relation Age of Onset  . Breast cancer Mother        DECEASED  . Alzheimer's disease Father   . Heart disease Brother   . Lupus Sister   . Colon polyps Neg Hx   .  Colon cancer Neg Hx   . Stomach cancer Neg Hx     Social History Social History   Tobacco Use  . Smoking status: Never Smoker  . Smokeless tobacco: Never Used  Substance Use Topics  . Alcohol use: Yes    Alcohol/week: 0.0 oz    Comment: very occasional  . Drug use: No    No Known Allergies  Current Outpatient Medications  Medication Sig Dispense Refill  . cyclobenzaprine (FLEXERIL) 10 MG tablet Take 1 tablet (10 mg total) by mouth at bedtime. 30 tablet 3  . diclofenac (VOLTAREN) 75 MG EC tablet TAKE 1 TABLET BY MOUTH TWICE A DAY WITH FOOD 60 tablet 5  . diphenhydrAMINE (BENADRYL) 25 MG tablet Take 25 mg by mouth every 6 (six) hours as needed for allergies. Reported on 08/21/2015    . HYDROcodone-acetaminophen (NORCO) 7.5-325 MG tablet One tablet every six hours as needed for pain.  30 day limit. 100 tablet 0  . omeprazole (PRILOSEC) 20 MG capsule TAKE 1 CAPSULE BY MOUTH 30 MINUTES PRIOR TO BREAKFAST AND SUPPER 60 capsule 11  . pseudoephedrine-acetaminophen (TYLENOL SINUS) 30-500 MG TABS tablet Take 1 tablet by mouth every 4 (four) hours as needed.     No current facility-administered medications for this visit.      Physical Exam  Blood pressure (!) 153/96, pulse 82, temperature 98.1 F (36.7 C), height 5'  7.5" (1.715 m), weight 157 lb (71.2 kg), last menstrual period 07/02/2013.  Constitutional: overall normal hygiene, normal nutrition, well developed, normal grooming, normal body habitus. Assistive device:none  Musculoskeletal: gait and station Limp none, muscle tone and strength are normal, no tremors or atrophy is present.  .  Neurological: coordination overall normal.  Deep tendon reflex/nerve stretch intact.  Sensation normal.  Cranial nerves II-XII intact.   Skin:   Normal overall no scars, lesions, ulcers or rashes. No psoriasis.  Psychiatric: Alert and oriented x 3.  Recent memory intact, remote memory unclear.  Normal mood and affect. Well groomed.  Good eye  contact.  Cardiovascular: overall no swelling, no varicosities, no edema bilaterally, normal temperatures of the legs and arms, no clubbing, cyanosis and good capillary refill.  Lymphatic: palpation is normal. Spine/Pelvis examination:  Inspection:  Overall, sacoiliac joint benign and hips nontender; without crepitus or defects.   Thoracic spine inspection: Alignment normal without kyphosis present   Lumbar spine inspection:  Alignment  with normal lumbar lordosis, without scoliosis apparent.   Thoracic spine palpation:  without tenderness of spinal processes   Lumbar spine palpation: without tenderness of lumbar area; without tightness of lumbar muscles    Range of Motion:   Lumbar flexion, forward flexion is normal without pain or tenderness    Lumbar extension is full without pain or tenderness   Left lateral bend is normal without pain or tenderness   Right lateral bend is normal without pain or tenderness   Straight leg raising is normal  Strength & tone: normal   Stability overall normal stability All other systems reviewed and are negative   The patient has been educated about the nature of the problem(s) and counseled on treatment options.  The patient appeared to understand what I have discussed and is in agreement with it.  Encounter Diagnosis  Name Primary?  . Chronic midline low back pain without sciatica Yes    PLAN Call if any problems.  Precautions discussed.  Continue current medications.   Return to clinic 2 weeks   Remain out of work.  Electronically Signed Darreld Mclean, MD 7/2/20199:57 AM

## 2018-01-03 NOTE — Patient Instructions (Signed)
OUT OF WORK ?

## 2018-01-04 ENCOUNTER — Encounter (HOSPITAL_COMMUNITY): Payer: Self-pay

## 2018-01-04 ENCOUNTER — Ambulatory Visit (HOSPITAL_COMMUNITY): Payer: BLUE CROSS/BLUE SHIELD | Attending: Orthopaedic Surgery

## 2018-01-04 DIAGNOSIS — M545 Low back pain: Secondary | ICD-10-CM | POA: Diagnosis not present

## 2018-01-04 DIAGNOSIS — R29898 Other symptoms and signs involving the musculoskeletal system: Secondary | ICD-10-CM | POA: Diagnosis not present

## 2018-01-04 DIAGNOSIS — R2689 Other abnormalities of gait and mobility: Secondary | ICD-10-CM

## 2018-01-04 DIAGNOSIS — R293 Abnormal posture: Secondary | ICD-10-CM

## 2018-01-04 DIAGNOSIS — G8929 Other chronic pain: Secondary | ICD-10-CM

## 2018-01-04 NOTE — Therapy (Signed)
[place] [facility] [address] [place], [place], [address] Phone: [phone]   Fax:  [phone]  Physical Therapy Treatment  Patient Details  Name: Sandra Davies MRN: [id number] Date of Birth: 1961/04/05 Referring Provider: Darreld Mclean MD   Encounter Date: 01/04/2018  PT End of Session - 01/04/18 1529    Visit Number  3    Number of Visits  13    Date for PT Re-Evaluation  02/06/18    Authorization Type  [employer] blue Shield (60 visit limit (PT/OT/SLP), 1 eval per calender year, re-eval every 21 days) no auth required    Authorization Time Period  12/26/17 - 02/06/18    Authorization - Visit Number  3    Authorization - Number of Visits  60    PT Start Time  1523    PT Stop Time  1605 4' on bike, not included with charges    PT Time Calculation (min)  42 min    Activity Tolerance  Patient tolerated treatment well;No increased pain    Behavior During Therapy  WFL for tasks assessed/performed       Past Medical History:  Diagnosis Date  . Arthritis   . Constipation - functional    FOR A LONG TIME  . Dysphagia 2015  . GERD (gastroesophageal reflux disease)     Past Surgical History:  Procedure Laterality Date  . ESOPHAGEAL DILATION N/A 09/03/2014   Procedure: ESOPHAGEAL DILATION;  Surgeon: West Bali, MD;  [facility];  Service: Endoscopy;  Laterality: N/A;  . ESOPHAGOGASTRODUODENOSCOPY N/A 09/03/2014   LKG:MWNUUVOZ gastritis/dysphagia due to distal esophagel stricture  . ESOPHAGOGASTRODUODENOSCOPY N/A 09/26/2014   DGU:YQIH non-erosive gastritis/stricture at the gastroesphageal  . KNEE SURGERY      There were no vitals filed for this visit.  Subjective Assessment - 01/04/18 1526    Subjective  Pt stated she has been doing a lot of cleaning today, reports pain scale 4/10.    Patient Stated Goals  ankle swells and goes down fron and back of leg.    Currently in Pain?  Yes    Pain Score  4     Pain Location   Back    Pain Orientation  Lower    Pain Descriptors / Indicators  Aching;Sore;Sharp    Pain Type  Chronic pain    Pain Onset  More than a month ago    Pain Frequency  Constant    Aggravating Factors   work moving things/packing around the house    Pain Relieving Factors  sitting, rest, medicine    Effect of Pain on Daily Activities  current out of work                       [facility] Adult PT Treatment/Exercise - 01/04/18 0001      Lumbar Exercises: Stretches   Lower Trunk Rotation  5 reps;10 seconds;Limitations    Lower Trunk Rotation Limitations  bilaterally    Standing Extension  10 reps;5 seconds    Other Lumbar Stretch Exercise  Child's pose: 3x 10 secodns with walking to Rt/Lt with hands      Lumbar Exercises: Aerobic   UBE (Upper Arm Bike)  UBE: 4 minutes retrograde for postural strengthening, level 1      Lumbar Exercises: Standing   Scapular Retraction  Strengthening;Both;15 reps;Theraband    Theraband Level (Scapular Retraction)  Level 2 (Red)    Row  Strengthening;15 reps;Both;Theraband  Theraband Level (Row)  Level 2 (Red)    Shoulder Extension  Strengthening;Both;15 reps;Theraband    Theraband Level (Shoulder Extension)  Level 2 (Red)    Other Standing Lumbar Exercises  back against wall with cervical retraction and UE flexion 10x (used towel for proprioception to improve retraction)      Lumbar Exercises: Seated   Other Seated Lumbar Exercises  Money/bil UE external shoulder rotation: 2x 15 reps with red theraband      Lumbar Exercises: Supine   Bridge  10 reps               PT Short Term Goals - 12/28/17 1327      PT SHORT TERM GOAL #1   Title  Patient will be independent with HEP, updated PRN, to improve posture, strength, and body mechanics to be abel to return to work tasks with decreased pain.    Time  2    Period  Weeks    Status  On-going      PT SHORT TERM GOAL #2   Title  Patient will improve ROM for all limited directions  by 8 degrees or up to WNL's to demosntrte significant improvement in ROM to be abel to perform work activities with proper form/body mechanics    Time  3    Period  Weeks    Status  On-going      PT SHORT TERM GOAL #3   Title  Patient will demonstrate safe/proper body mechanics with squat and lifting technique to improve safety at work and reduce stress on low back.    Time  3    Period  Weeks    Status  On-going      PT SHORT TERM GOAL #4   Title  Patient will improve SLS to 30 seconds on bil LE's to imprvoe safety wtih gait and stair mobiltiy and demonstrate improved balance awareness/procpriocetion for Rt LE.    Time  3    Period  Weeks    Status  On-going        PT Long Term Goals - 12/28/17 1327      PT LONG TERM GOAL #1   Title  Patient will improve FOTO by 12% or more to demosntrate significant improvement in self reported functional performance and decreased limitations.    Time  6    Period  Weeks    Status  On-going      PT LONG TERM GOAL #2   Title  Patient will demonstrate safe/proper body mechanics with lifting and turning to move heavy glass items at work as well as proper mechanics with pushing/pulling crates to decrease pain and improve safety with return to work.    Time  6    Period  Weeks    Status  On-going      PT LONG TERM GOAL #3   Title  Patient will improve posture while walking and sitting throughout the day at least 50% of her day to improve postural endurance muscel activation to reduce strain on low back.    Time  6    Period  Weeks    Status  On-going      PT LONG TERM GOAL #4   Title  Patient will have reduced tenderness to PA's of lumbar spine and palpation of paraspinals as well as no muscle guarding with palpation to show reduce hypertonicity and hypersensitivity of lumbar endurance muscles.    Time  6    Period  Weeks  Status  On-going            Plan - 01/04/18 1555    Clinical Impression Statement  Session focus on postural  strengthening, lumbar mobility and instruction on proper lifting mechanics.  Pt with forward head and required some cueing to improve cervical retraction with seated and standing exercises.  Added standing cervical retraction for postural strengthening.  Also reviewed proper lifting mechanics with education on prioper mechancis wiht squats, pt able to demonstrate improved mechanics following training.  EOS reports of pain reduced to 3/10.      Rehab Potential  Good    PT Frequency  2x / week    PT Duration  6 weeks    PT Treatment/Interventions  ADLs/Self Care Home Management;Aquatic Therapy;Electrical Stimulation;Moist Heat;Cryotherapy;Stair training;Functional mobility training;Therapeutic activities;Balance training;Therapeutic exercise;Neuromuscular re-education;Patient/family education;Manual techniques;Passive range of motion;Dry needling    PT Next Visit Plan  Initiate PA's to lumbar and thoracic spine if with PT. Continue postural strengthening in sitting/standing. Continue with functional squat training. Review lifting mechanics, continue with lumbar stretching.    PT Home Exercise Plan  Eval: SKTC;        Patient will benefit from skilled therapeutic intervention in order to improve the following deficits and impairments:  Decreased activity tolerance, Decreased endurance, Decreased range of motion, Decreased strength, Improper body mechanics, Pain, Hypomobility, Decreased balance, Decreased mobility, Postural dysfunction, Impaired flexibility  Visit Diagnosis: Chronic midline low back pain, with sciatica presence unspecified  Abnormal posture  Other abnormalities of gait and mobility  Other symptoms and signs involving the musculoskeletal system     Problem List Patient Active Problem List   Diagnosis Date Noted  . Midline low back pain without sciatica 11/06/2015  . Menopausal symptoms 04/09/2015  . Dysphagia, pharyngoesophageal phase   . Dysphagia 08/21/2014  .  Constipation 08/21/2014  . IRREGULAR MENSTRUAL CYCLE 11/15/2007  . VERTIGO, CHRONIC 11/15/2007  . HEADACHE 11/15/2007  . SYNCOPE, HX OF 11/15/2007    Sandra Davies, Sandra Davies 01/04/2018, 4:06 PM  [facility] [address] [place], [place], [address] Phone: [phone]   Fax:  [phone]  Name: Sandra Davies MRN: [id number] Date of Birth: 04/02/1961

## 2018-01-09 ENCOUNTER — Encounter (HOSPITAL_COMMUNITY): Payer: Self-pay

## 2018-01-09 ENCOUNTER — Ambulatory Visit (HOSPITAL_COMMUNITY): Payer: BLUE CROSS/BLUE SHIELD

## 2018-01-09 DIAGNOSIS — R29898 Other symptoms and signs involving the musculoskeletal system: Secondary | ICD-10-CM | POA: Diagnosis not present

## 2018-01-09 DIAGNOSIS — M545 Low back pain: Secondary | ICD-10-CM | POA: Diagnosis not present

## 2018-01-09 DIAGNOSIS — R2689 Other abnormalities of gait and mobility: Secondary | ICD-10-CM | POA: Diagnosis not present

## 2018-01-09 DIAGNOSIS — R293 Abnormal posture: Secondary | ICD-10-CM | POA: Diagnosis not present

## 2018-01-09 DIAGNOSIS — G8929 Other chronic pain: Secondary | ICD-10-CM | POA: Diagnosis not present
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
## 2018-01-11 ENCOUNTER — Ambulatory Visit (HOSPITAL_COMMUNITY): Payer: BLUE CROSS/BLUE SHIELD

## 2018-01-11 DIAGNOSIS — M545 Low back pain: Principal | ICD-10-CM

## 2018-01-11 DIAGNOSIS — G8929 Other chronic pain: Secondary | ICD-10-CM | POA: Diagnosis not present

## 2018-01-11 DIAGNOSIS — R29898 Other symptoms and signs involving the musculoskeletal system: Secondary | ICD-10-CM

## 2018-01-11 DIAGNOSIS — R2689 Other abnormalities of gait and mobility: Secondary | ICD-10-CM | POA: Diagnosis not present

## 2018-01-11 DIAGNOSIS — R293 Abnormal posture: Secondary | ICD-10-CM | POA: Diagnosis not present

## 2018-01-11 NOTE — Patient Instructions (Signed)
BACK: Child's Pose (Sciatica)    Sit in knee-chest position and reach arms forward. Separate knees for comfort. Hold position for 30 breaths. Repeat 3 times. Do 2 times per day.  Copyright  VHI. All rights reserved.   Shoulder Push-Up (Prone on Elbows)    With elbows placed under shoulders, rise up on elbows as high as possible. Keep hips on surface and back arched. Hold 2 minutes.   Copyright  VHI. All rights reserved.

## 2018-01-11 NOTE — Therapy (Signed)
[facility] [address] [place], [place], [address] Phone: [phone]   Fax:  [phone]  Physical Therapy Treatment  Patient Details  Name: Sandra Davies MRN: [id number] Date of Birth: 11/11/1960 Referring Provider: Darreld McleanKeeling, Wayne MD   Encounter Date: 01/11/2018  PT End of Session - 01/11/18 1601    Visit Number  5    Number of Visits  13    Date for PT Re-Evaluation  02/06/18 Minireassess 01/16/18    Authorization Type  [employer] blue Shield (60 visit limit (PT/OT/SLP), 1 eval per calender year, re-eval every 21 days) no auth required    Authorization Time Period  12/26/17 - 02/06/18    Authorization - Visit Number  5    Authorization - Number of Visits  60    PT Start Time  1600 began on bike x 4', no charge    PT Stop Time  1642    PT Time Calculation (min)  42 min    Activity Tolerance  Patient tolerated treatment well;No increased pain    Behavior During Therapy  WFL for tasks assessed/performed       Past Medical History:  Diagnosis Date  . Arthritis   . Constipation - functional    FOR A LONG TIME  . Dysphagia 2015  . GERD (gastroesophageal reflux disease)     Past Surgical History:  Procedure Laterality Date  . ESOPHAGEAL DILATION N/A 09/03/2014   Procedure: ESOPHAGEAL DILATION;  Surgeon: West BaliSandi L Fields, MD;  [facility];  Service: Endoscopy;  Laterality: N/A;  . ESOPHAGOGASTRODUODENOSCOPY N/A 09/03/2014   TDD:UKGURKYHSLF:moderate gastritis/dysphagia due to distal esophagel stricture  . ESOPHAGOGASTRODUODENOSCOPY N/A 09/26/2014   CWC:BJSESLF:mild non-erosive gastritis/stricture at the gastroesphageal  . KNEE SURGERY      There were no vitals filed for this visit.  Subjective Assessment - 01/11/18 1601    Subjective  Pt reports that her back is a little tight today. States it is about a 4/10.    Patient Stated Goals  ankle swells and goes down fron and back of leg.    Currently in Pain?  Yes    Pain Score  4     Pain  Location  Back    Pain Orientation  Lower    Pain Descriptors / Indicators  Tightness    Pain Type  Chronic pain    Pain Onset  More than a month ago    Pain Frequency  Constant    Aggravating Factors   work, moving things/packing around the hosue    Pain Relieving Factors  sitting, rest, medicine    Effect of Pain on Daily Activities  currently out of work                       [facility]/Exercise - 01/11/18 0001      Lumbar Exercises: Stretches   Prone on Elbows Stretch  -- 2 minutes    Press Ups  5 reps;10 seconds    Other Lumbar Stretch Exercise  Child's pose: 3x 10 secodns with walking to Rt/Lt with hands      Lumbar Exercises: Aerobic   UBE (Upper Arm Bike)  UBE: 4 minutes retrograde for postural strengthening, level 2      Lumbar Exercises: Standing   Lifting  From 12";15 reps yellow ball with good mechanics noted    Shoulder Adduction Limitations  horiz abd GTB 2x10 reps; money with GTB x10 reps  Other Standing Lumbar Exercises  Y facing wall with liftoff with RTB 2x15    Other Standing Lumbar Exercises  bil D2 PNF flexion GTB 2x10 reps      Lumbar Exercises: Prone   Single Arm Raise  Right;Left;10 reps;3 seconds    Other Prone Lumbar Exercises  rows (1#) and shoulder extension 15x    Other Prone Lumbar Exercises  I, T and Y 10x each               PT Short Term Goals - 12/28/17 1327      PT SHORT TERM GOAL #1   Title  Patient will be independent with HEP, updated PRN, to improve posture, strength, and body mechanics to be abel to return to work tasks with decreased pain.    Time  2    Period  Weeks    Status  On-going      PT SHORT TERM GOAL #2   Title  Patient will improve ROM for all limited directions by 8 degrees or up to WNL's to demosntrte significant improvement in ROM to be abel to perform work activities with proper form/body mechanics    Time  3    Period  Weeks    Status  On-going      PT SHORT TERM GOAL #3    Title  Patient will demonstrate safe/proper body mechanics with squat and lifting technique to improve safety at work and reduce stress on low back.    Time  3    Period  Weeks    Status  On-going      PT SHORT TERM GOAL #4   Title  Patient will improve SLS to 30 seconds on bil LE's to imprvoe safety wtih gait and stair mobiltiy and demonstrate improved balance awareness/procpriocetion for Rt LE.    Time  3    Period  Weeks    Status  On-going        PT Long Term Goals - 12/28/17 1327      PT LONG TERM GOAL #1   Title  Patient will improve FOTO by 12% or more to demosntrate significant improvement in self reported functional performance and decreased limitations.    Time  6    Period  Weeks    Status  On-going      PT LONG TERM GOAL #2   Title  Patient will demonstrate safe/proper body mechanics with lifting and turning to move heavy glass items at work as well as proper mechanics with pushing/pulling crates to decrease pain and improve safety with return to work.    Time  6    Period  Weeks    Status  On-going      PT LONG TERM GOAL #3   Title  Patient will improve posture while walking and sitting throughout the day at least 50% of her day to improve postural endurance muscel activation to reduce strain on low back.    Time  6    Period  Weeks    Status  On-going      PT LONG TERM GOAL #4   Title  Patient will have reduced tenderness to PA's of lumbar spine and palpation of paraspinals as well as no muscle guarding with palpation to show reduce hypertonicity and hypersensitivity of lumbar endurance muscles.    Time  6    Period  Weeks    Status  On-going            Plan -  01/11/18 1637    Clinical Impression Statement  Progressed postural strengthening with additional I, T, and Y UE movements in prone.  Continued with lumbar mobility to address tightness and functional strengthening with proper lifting.  Pt able to demonstrate good mechanics wiht proper lifting,  educated on having objects closer to body to reduce stress with activity.  Pt given additional HEP to address lumbar mobility for pain control.  EOS reports pain reduced to 2/10.      Rehab Potential  Good    PT Frequency  2x / week    PT Duration  6 weeks    PT Treatment/Interventions  ADLs/Self Care Home Management;Aquatic Therapy;Electrical Stimulation;Moist Heat;Cryotherapy;Stair training;Functional mobility training;Therapeutic activities;Balance training;Therapeutic exercise;Neuromuscular re-education;Patient/family education;Manual techniques;Passive range of motion;Dry needling    PT Next Visit Plan  Initiate PA's to lumbar and thoracic spine if with PT. Continue postural strengthening in standing/prone. Continue with functional squat training. Review lifting mechanics, continue with lumbar stretching.  Progress to prone UE/LE lifts next session.      PT Home Exercise Plan  Eval: SKTC; bridge, GTB postural strengthening.  7/10: child's pose and POE       Patient will benefit from skilled therapeutic intervention in order to improve the following deficits and impairments:  Decreased activity tolerance, Decreased endurance, Decreased range of motion, Decreased strength, Improper body mechanics, Pain, Hypomobility, Decreased balance, Decreased mobility, Postural dysfunction, Impaired flexibility  Visit Diagnosis: Chronic midline low back pain, with sciatica presence unspecified  Abnormal posture  Other abnormalities of gait and mobility  Other symptoms and signs involving the musculoskeletal system     Problem List Patient Active Problem List   Diagnosis Date Noted  . Midline low back pain without sciatica 11/06/2015  . Menopausal symptoms 04/09/2015  . Dysphagia, pharyngoesophageal phase   . Dysphagia 08/21/2014  . Constipation 08/21/2014  . IRREGULAR MENSTRUAL CYCLE 11/15/2007  . VERTIGO, CHRONIC 11/15/2007  . HEADACHE 11/15/2007  . SYNCOPE, HX OF 11/15/2007   Becky Sax, LPTA; CBIS [phone]  Juel Burrow 01/11/2018, 6:00 PM  [facility] [address] [place], [place], [address] Phone: [phone]   Fax:  [phone]  Name: Sandra Davies MRN: [id number] Date of Birth: 09-Nov-1960

## 2018-01-16 ENCOUNTER — Ambulatory Visit (HOSPITAL_COMMUNITY): Payer: BLUE CROSS/BLUE SHIELD | Admitting: Physical Therapy

## 2018-01-16 ENCOUNTER — Encounter (HOSPITAL_COMMUNITY): Payer: Self-pay | Admitting: Physical Therapy

## 2018-01-16 DIAGNOSIS — R293 Abnormal posture: Secondary | ICD-10-CM

## 2018-01-16 DIAGNOSIS — G8929 Other chronic pain: Secondary | ICD-10-CM | POA: Diagnosis not present

## 2018-01-16 DIAGNOSIS — R29898 Other symptoms and signs involving the musculoskeletal system: Secondary | ICD-10-CM

## 2018-01-16 DIAGNOSIS — R2689 Other abnormalities of gait and mobility: Secondary | ICD-10-CM

## 2018-01-16 DIAGNOSIS — M545 Low back pain: Principal | ICD-10-CM

## 2018-01-16 NOTE — Therapy (Signed)
[facility] [address] [place], [place], [address] Phone: [phone]   Fax:  [phone]  Physical Therapy Treatment  Patient Details  Name: Sandra BrunsJacqueline D Dillenburg MRN: [id number] Date of Birth: 10/12/1960 Referring Provider: Darreld McleanKeeling, Wayne MD   Encounter Date: 01/16/2018  PT End of Session - 01/16/18 1718    Visit Number  6    Number of Visits  13    Date for PT Re-Evaluation  02/06/18 Minireassess 01/16/18    Authorization Type  [employer] blue [employer] (60 visit limit (PT/OT/SLP), 1 eval per calender year, re-eval every 21 days) no auth required    Authorization Time Period  12/26/17 - 02/06/18    Authorization - Visit Number  6    Authorization - Number of Visits  60    PT Start Time  1516    PT Stop Time  1604    PT Time Calculation (min)  48 min    Activity Tolerance  Patient tolerated treatment well;No increased pain    Behavior During Therapy  WFL for tasks assessed/performed       Past Medical History:  Diagnosis Date  . Arthritis   . Constipation - functional    FOR A LONG TIME  . Dysphagia 2015  . GERD (gastroesophageal reflux disease)     Past Surgical History:  Procedure Laterality Date  . ESOPHAGEAL DILATION N/A 09/03/2014   Procedure: ESOPHAGEAL DILATION;  Surgeon: West BaliSandi L Fields, MD;  [facility];  Service: Endoscopy;  Laterality: N/A;  . ESOPHAGOGASTRODUODENOSCOPY N/A 09/03/2014   ONG:EXBMWUXLSLF:moderate gastritis/dysphagia due to distal esophagel stricture  . ESOPHAGOGASTRODUODENOSCOPY N/A 09/26/2014   KGM:WNUUSLF:mild non-erosive gastritis/stricture at the gastroesphageal  . KNEE SURGERY      There were no vitals filed for this visit.  Subjective Assessment - 01/16/18 1717    Subjective  PT states she is feeling about the same today, 3/10 pain in her back.    Currently in Pain?  Yes    Pain Score  3     Pain Location  Back    Pain Orientation  Lower    Pain Descriptors / Indicators  Tightness                        [facility] Adult PT Treatment/Exercise - 01/16/18 0001      Lumbar Exercises: Aerobic   UBE (Upper Arm Bike)  UBE: 4 minutes retrograde for postural strengthening, level 2      Lumbar Exercises: Standing   Lifting  From 12";15 reps    Shoulder Adduction Limitations  horiz abd GTB 2X15    Other Standing Lumbar Exercises  Y facing wall with liftoff with RTB 2x15    Other Standing Lumbar Exercises  bil D2 PNF flexion GTB 2x10 reps      Lumbar Exercises: Prone   Single Arm Raise  Right;Left;10 reps;3 seconds;Limitations    Single Arm Raises Limitations  2 sets    Other Prone Lumbar Exercises  rows (1#) and shoulder extension 15x    Other Prone Lumbar Exercises  I, T and Y 10x each with 1#               PT Short Term Goals - 12/28/17 1327      PT SHORT TERM GOAL #1   Title  Patient will be independent with HEP, updated PRN, to improve posture, strength, and body mechanics to be abel to return to work tasks with decreased  pain.    Time  2    Period  Weeks    Status  On-going      PT SHORT TERM GOAL #2   Title  Patient will improve ROM for all limited directions by 8 degrees or up to WNL's to demosntrte significant improvement in ROM to be abel to perform work activities with proper form/body mechanics    Time  3    Period  Weeks    Status  On-going      PT SHORT TERM GOAL #3   Title  Patient will demonstrate safe/proper body mechanics with squat and lifting technique to improve safety at work and reduce stress on low back.    Time  3    Period  Weeks    Status  On-going      PT SHORT TERM GOAL #4   Title  Patient will improve SLS to 30 seconds on bil LE's to imprvoe safety wtih gait and stair mobiltiy and demonstrate improved balance awareness/procpriocetion for Rt LE.    Time  3    Period  Weeks    Status  On-going        PT Long Term Goals - 12/28/17 1327      PT LONG TERM GOAL #1   Title  Patient will improve FOTO by 12% or more  to demosntrate significant improvement in self reported functional performance and decreased limitations.    Time  6    Period  Weeks    Status  On-going      PT LONG TERM GOAL #2   Title  Patient will demonstrate safe/proper body mechanics with lifting and turning to move heavy glass items at work as well as proper mechanics with pushing/pulling crates to decrease pain and improve safety with return to work.    Time  6    Period  Weeks    Status  On-going      PT LONG TERM GOAL #3   Title  Patient will improve posture while walking and sitting throughout the day at least 50% of her day to improve postural endurance muscel activation to reduce strain on low back.    Time  6    Period  Weeks    Status  On-going      PT LONG TERM GOAL #4   Title  Patient will have reduced tenderness to PA's of lumbar spine and palpation of paraspinals as well as no muscle guarding with palpation to show reduce hypertonicity and hypersensitivity of lumbar endurance muscles.    Time  6    Period  Weeks    Status  On-going            Plan - 01/16/18 1719    Clinical Impression Statement  continued to improve postural and lumbar strength.  Added 1# weight to I, T and Y prone exercises with good form and no issues noted.  Worked on Estate manager/land agent with good form displayed with general transfer box lift from 12" height.  Pt with main issue of twisting/lateral movements while on her job.  Educated to move her body as a unit when she can to avoid the twisting movments.  No change in pain reported at end of session.      Rehab Potential  Good    PT Frequency  2x / week    PT Duration  6 weeks    PT Treatment/Interventions  ADLs/Self Care Home Management;Aquatic Therapy;Electrical Stimulation;Moist Heat;Cryotherapy;Stair training;Functional mobility training;Therapeutic activities;Balance  training;Therapeutic exercise;Neuromuscular re-education;Patient/family education;Manual techniques;Passive range of  motion;Dry needling    PT Next Visit Plan  Initiate PA's to lumbar and thoracic spine if with PT. Continue postural strengthening in standing/prone. Continue with functional strengthening and body mechanics education.  Add planks next session.     PT Home Exercise Plan  Eval: SKTC; bridge, GTB postural strengthening.  7/10: child's pose and POE       Patient will benefit from skilled therapeutic intervention in order to improve the following deficits and impairments:  Decreased activity tolerance, Decreased endurance, Decreased range of motion, Decreased strength, Improper body mechanics, Pain, Hypomobility, Decreased balance, Decreased mobility, Postural dysfunction, Impaired flexibility  Visit Diagnosis: Chronic midline low back pain, with sciatica presence unspecified  Other symptoms and signs involving the musculoskeletal system  Other abnormalities of gait and mobility  Abnormal posture     Problem List Patient Active Problem List   Diagnosis Date Noted  . Midline low back pain without sciatica 11/06/2015  . Menopausal symptoms 04/09/2015  . Dysphagia, pharyngoesophageal phase   . Dysphagia 08/21/2014  . Constipation 08/21/2014  . IRREGULAR MENSTRUAL CYCLE 11/15/2007  . VERTIGO, CHRONIC 11/15/2007  . HEADACHE 11/15/2007  . SYNCOPE, HX OF 11/15/2007   Lurena Nida, PTA/CLT [phone]  Lurena Nida 01/16/2018, 5:24 PM  [facility] [address] [place], [place], [address] Phone: [phone]   Fax:  [phone]  Name: BERNARDETTE WALDRON MRN: [id number] Date of Birth: Jul 17, 1960

## 2018-01-17 ENCOUNTER — Encounter: Payer: Self-pay | Admitting: Orthopaedic Surgery

## 2018-01-17 ENCOUNTER — Ambulatory Visit: Payer: BLUE CROSS/BLUE SHIELD | Admitting: Orthopaedic Surgery

## 2018-01-17 VITALS — BP 123/76 | HR 93 | Ht 67.5 in | Wt 157.0 lb

## 2018-01-17 DIAGNOSIS — M545 Low back pain, unspecified: Secondary | ICD-10-CM

## 2018-01-17 DIAGNOSIS — G8929 Other chronic pain: Secondary | ICD-10-CM

## 2018-01-17 NOTE — Patient Instructions (Signed)
Out of work 

## 2018-01-17 NOTE — Progress Notes (Signed)
Patient ZO:[id number] Sandra Davies, female DOB:May 15, 1961, 57 y.o. WJX:[id number]  Chief Complaint  Patient presents with  . Back Pain    LBP after PT    HPI  Sandra Davies is a 57 y.o. female who has chronic lower back pain.  She has been going to PT and is making progress.  She has pain still but it is less. She has been doing her exercises. She has no weakness, no new trauma.   Body mass index is 24.23 kg/m.  ROS  Review of Systems  All other systems reviewed and are negative.  Past Medical History:  Diagnosis Date  . Arthritis   . Constipation - functional    FOR A LONG TIME  . Dysphagia 2015  . GERD (gastroesophageal reflux disease)     Past Surgical History:  Procedure Laterality Date  . ESOPHAGEAL DILATION N/A 09/03/2014   Procedure: ESOPHAGEAL DILATION;  Surgeon: West Bali, MD;  [facility];  Service: Endoscopy;  Laterality: N/A;  . ESOPHAGOGASTRODUODENOSCOPY N/A 09/03/2014   OZH:YQMVHQIO gastritis/dysphagia due to distal esophagel stricture  . ESOPHAGOGASTRODUODENOSCOPY N/A 09/26/2014   NGE:XBMW non-erosive gastritis/stricture at the gastroesphageal  . KNEE SURGERY      Family History  Problem Relation Age of Onset  . Breast cancer Mother        DECEASED  . Alzheimer's disease Father   . Heart disease Brother   . Lupus Sister   . Colon polyps Neg Hx   . Colon cancer Neg Hx   . Stomach cancer Neg Hx     Social History Social History   Tobacco Use  . Smoking status: Never Smoker  . Smokeless tobacco: Never Used  Substance Use Topics  . Alcohol use: Yes    Alcohol/week: 0.0 oz    Comment: very occasional  . Drug use: No    No Known Allergies  Current Outpatient Medications  Medication Sig Dispense Refill  . cyclobenzaprine (FLEXERIL) 10 MG tablet Take 1 tablet (10 mg total) by mouth at bedtime. 30 tablet 3  . diclofenac (VOLTAREN) 75 MG EC tablet TAKE 1 TABLET BY MOUTH TWICE A DAY WITH FOOD 60 tablet 5  . diphenhydrAMINE  (BENADRYL) 25 MG tablet Take 25 mg by mouth every 6 (six) hours as needed for allergies. Reported on 08/21/2015    . HYDROcodone-acetaminophen (NORCO) 7.5-325 MG tablet One tablet every six hours as needed for pain.  30 day limit. 100 tablet 0  . omeprazole (PRILOSEC) 20 MG capsule TAKE 1 CAPSULE BY MOUTH 30 MINUTES PRIOR TO BREAKFAST AND SUPPER 60 capsule 11  . pseudoephedrine-acetaminophen (TYLENOL SINUS) 30-500 MG TABS tablet Take 1 tablet by mouth every 4 (four) hours as needed.     No current facility-administered medications for this visit.      Physical Exam  Blood pressure 123/76, pulse 93, height 5' 7.5" (1.715 m), weight 157 lb (71.2 kg), last menstrual period 07/02/2013.  Constitutional: overall normal hygiene, normal nutrition, well developed, normal grooming, normal body habitus. Assistive device:none  Musculoskeletal: gait and station Limp none, muscle tone and strength are normal, no tremors or atrophy is present.  .  Neurological: coordination overall normal.  Deep tendon reflex/nerve stretch intact.  Sensation normal.  Cranial nerves II-XII intact.   Skin:   Normal overall no scars, lesions, ulcers or rashes. No psoriasis.  Psychiatric: Alert and oriented x 3.  Recent memory intact, remote memory unclear.  Normal mood and affect. Well groomed.  Good eye contact.  Cardiovascular: overall  no swelling, no varicosities, no edema bilaterally, normal temperatures of the legs and arms, no clubbing, cyanosis and good capillary refill.  Lymphatic: palpation is normal.  Spine/Pelvis examination:  Inspection:  Overall, sacoiliac joint benign and hips nontender; without crepitus or defects.   Thoracic spine inspection: Alignment normal without kyphosis present   Lumbar spine inspection:  Alignment  with normal lumbar lordosis, without scoliosis apparent.   Thoracic spine palpation:  without tenderness of spinal processes   Lumbar spine palpation: without tenderness of lumbar  area; without tightness of lumbar muscles    Range of Motion:   Lumbar flexion, forward flexion is normal without pain or tenderness    Lumbar extension is full without pain or tenderness   Left lateral bend is normal without pain or tenderness   Right lateral bend is normal without pain or tenderness   Straight leg raising is normal  Strength & tone: normal   Stability overall normal stability All other systems reviewed and are negative   The patient has been educated about the nature of the problem(s) and counseled on treatment options.  The patient appeared to understand what I have discussed and is in agreement with it.  Encounter Diagnosis  Name Primary?  . Chronic midline low back pain without sciatica Yes    PLAN Call if any problems.  Precautions discussed.  Continue current medications.   Return to clinic 2 weeks   Continue PT.  Electronically Signed Darreld McleanWayne Alyla Pietila, MD 7/16/20191:41 PM

## 2018-01-18 ENCOUNTER — Encounter (HOSPITAL_COMMUNITY): Payer: Self-pay

## 2018-01-18 ENCOUNTER — Ambulatory Visit (HOSPITAL_COMMUNITY): Payer: BLUE CROSS/BLUE SHIELD

## 2018-01-18 DIAGNOSIS — R29898 Other symptoms and signs involving the musculoskeletal system: Secondary | ICD-10-CM | POA: Diagnosis not present

## 2018-01-18 DIAGNOSIS — G8929 Other chronic pain: Secondary | ICD-10-CM

## 2018-01-18 DIAGNOSIS — M545 Low back pain: Principal | ICD-10-CM

## 2018-01-18 DIAGNOSIS — R293 Abnormal posture: Secondary | ICD-10-CM

## 2018-01-18 DIAGNOSIS — R2689 Other abnormalities of gait and mobility: Secondary | ICD-10-CM

## 2018-01-18 NOTE — Patient Instructions (Signed)
Double Knee to Chest (Flexion)    Gently pull both knees toward chest. Feel stretch in lower back or buttock area. Breathing deeply, Hold 30 seconds. Repeat 2-3 times. Do 1-2 sessions per day.  http://gt2.exer.us/228   Copyright  VHI. All rights reserved.

## 2018-01-18 NOTE — Therapy (Signed)
[facility] [address] [place], [place], [address] Phone: [phone]   Fax:  [phone]  Physical Therapy Treatment  Patient Details  Name: Sandra Davies MRN: [id number] Date of Birth: 1961-06-01 Referring Provider: Darreld Mclean, MD   Encounter Date: 01/18/2018  PT End of Session - 01/18/18 1612    Visit Number  7    Number of Visits  13    Date for PT Re-Evaluation  02/06/18 Minireassess complete 7/17    Authorization Type  [employer] Cross blue Shield (60 visit limit (PT/OT/SLP), 1 eval per calender year, re-eval every 21 days) no auth required    Authorization Time Period  12/26/17 - 02/06/18    Authorization - Visit Number  7    Authorization - Number of Visits  60    PT Start Time  1522    PT Stop Time  1608    PT Time Calculation (min)  46 min    Activity Tolerance  Patient tolerated treatment well;No increased pain Reports pain resolved at EOS     Behavior During Therapy  [facility] for tasks assessed/performed       Past Medical History:  Diagnosis Date  . Arthritis   . Constipation - functional    FOR A LONG TIME  . Dysphagia 2015  . GERD (gastroesophageal reflux disease)     Past Surgical History:  Procedure Laterality Date  . ESOPHAGEAL DILATION N/A 09/03/2014   Procedure: ESOPHAGEAL DILATION;  Surgeon: West Bali, MD;  [facility];  Service: Endoscopy;  Laterality: N/A;  . ESOPHAGOGASTRODUODENOSCOPY N/A 09/03/2014   ONG:EXBMWUXL gastritis/dysphagia due to distal esophagel stricture  . ESOPHAGOGASTRODUODENOSCOPY N/A 09/26/2014   KGM:WNUU non-erosive gastritis/stricture at the gastroesphageal  . KNEE SURGERY      There were no vitals filed for this visit.  Subjective Assessment - 01/18/18 1517    Subjective  Pt stated she has constant discomfort when she's doing functional activities around the house.  Reports complaince with HEP daily.    How long can you sit comfortably?  unlimited    How long can you  stand comfortably?  Able to stand for 20 minutes, able to complete all dish washing without rest breaks (did state concrete makes worse, when go to job)    How long can you walk comfortably?  Able to walk a mile easily, 30-45 minutes easily (was 1/2 mile)    Patient Stated Goals  ankle swells and goes down fron and back of leg.    Currently in Pain?  Yes    Pain Score  4     Pain Location  Back    Pain Orientation  Lower    Pain Descriptors / Indicators  Discomfort    Pain Onset  More than a month ago    Pain Frequency  Constant    Aggravating Factors   work, moving things/ packing around the house    Pain Relieving Factors  sitting, rest, medicine    Effect of Pain on Daily Activities  currently out of work         [facility] PT Assessment - 01/18/18 0001      Assessment   Medical Diagnosis  Low Back Pain    Referring Provider  Darreld Mclean, MD    Onset Date/Surgical Date  -- approximately 2 months ago (flare up)    Next MD Visit  01/31/18    Prior Therapy  yes, 1-2 years ago here for LBP  Observation/Other Assessments   Focus on Therapeutic Outcomes (FOTO)   47% limited was 50% limited      Functional Tests   Functional tests  Squat;Single leg stance;Other      Squat   Comments  improved mechanics wiht min cueing to keep object close to body and reduce knee valgus NBOS, knee valus bil, excesive forward trunk lean, anterior       Single Leg Stance   Comments  Lt 60" 1st attempts; Rt 3 attempts: 17", 4", 8"  was Rt LE = 12; Lt LE = 26      Other:   Other/ Comments  Lifting mechanics: patient able to demonstrate good mechanics      Posture/Postural Control   Posture/Postural Control  Postural limitations    Postural Limitations  Rounded Shoulders;Forward head;Increased lumbar lordosis    Posture Comments  Improved awareness of posture, compliant with HEP for postural strengthening      ROM / Strength   AROM / PROM / Strength  AROM;Strength      AROM   AROM Assessment  Site  Lumbar    Lumbar Flexion  72 can touch toes was 38    Lumbar Extension  20 was 14    Lumbar - Right Side Bend  41 cm from floor was 18    Lumbar - Left Side Bend  32cm from floor; was 22    Lumbar - Right Rotation  limited 25% was 6    Lumbar - Left Rotation  limited 25% was 8      Strength   Strength Assessment Site  Hip;Knee;Ankle    Right/Left Hip  Right;Left    Right Hip Flexion  5/5 was 4+    Right Hip Extension  4+/5 was 4+    Right Hip ABduction  5/5 was 4+    Left Hip Flexion  -- was 4+    Left Hip Extension  5/5 was 4+    Left Hip ABduction  5/5 was 4+    Right/Left Knee  Right;Left    Right Knee Flexion  4+/5 was 4/5    Right Knee Extension  -- was 4+/5    Left Knee Flexion  5/5 was 4+/5    Left Knee Extension  -- was 4+/5    Right Ankle Dorsiflexion  -- was 4+    Left Ankle Dorsiflexion  -- was 4+      Palpation   Spinal mobility  Hypomobile alone lumbar spine and tender along thoracic and lumbar spine    Palpation comment  continues to be hypomobile and tenderness along lumbar paraspinals was tenderness along paraspinals and gluteus maximus bil low                   [facility] Adult PT Treatment/Exercise - 01/18/18 0001      Lumbar Exercises: Stretches   Double Knee to Chest Stretch  2 reps;30 seconds      Lumbar Exercises: Standing   Lifting  From floor;From 12";From waist;10 reps    Other Standing Lumbar Exercises  SLS Lt 60" 1st attempt; Rt 17" max of 3      Lumbar Exercises: Prone   Opposite Arm/Leg Raise  Right arm/Left leg;Left arm/Right leg;5 reps;5 seconds    Other Prone Lumbar Exercises  I, T and Y 10x each with 1#               PT Short Term Goals - 01/18/18 1531      PT SHORT  TERM GOAL #1   Title  Patient will be independent with HEP, updated PRN, to improve posture, strength, and body mechanics to be abel to return to work tasks with decreased pain.    Baseline  7/17:  Reports compliance with HEP daily    Status  Achieved       PT SHORT TERM GOAL #2   Title  Patient will improve ROM for all limited directions by 8 degrees or up to WNL's to demosntrte significant improvement in ROM to be abel to perform work activities with proper form/body mechanics    Status  On-going      PT SHORT TERM GOAL #3   Title  Patient will demonstrate safe/proper body mechanics with squat and lifting technique to improve safety at work and reduce stress on low back.    Baseline  7/17:  Able to demonstrate proper mechanis with squats and lifting techniques, reports needs to do faster pace for RTW    Status  Achieved      PT SHORT TERM GOAL #4   Title  Patient will improve SLS to 30 seconds on bil LE's to imprvoe safety wtih gait and stair mobiltiy and demonstrate improved balance awareness/procpriocetion for Rt LE.    Baseline  7/17: Lt 60" 1st attempt; Rt 17" max of 3    Status  On-going        PT Long Term Goals - 01/18/18 1606      PT LONG TERM GOAL #2   Title  Patient will demonstrate safe/proper body mechanics with lifting and turning to move heavy glass items at work as well as proper mechanics with pushing/pulling crates to decrease pain and improve safety with return to work.    Baseline  7/17:  Able to demonstrate proper mechanis with squats and lifting techniques, reports needs to do faster pace for RTW      PT LONG TERM GOAL #3   Title  Patient will improve posture while walking and sitting throughout the day at least 50% of her day to improve postural endurance muscel activation to reduce strain on low back.    Baseline  01/18/18: Able to verbalize importance of posture      PT LONG TERM GOAL #4   Title  Patient will have reduced tenderness to PA's of lumbar spine and palpation of paraspinals as well as no muscle guarding with palpation to show reduce hypertonicity and hypersensitivity of lumbar endurance muscles.    Baseline  01/18/18:            Plan - 01/18/18 1619    Clinical Impression Statement   Reviewed goals this session with good progress.  Pt reports improved tolerance for standing and walking, compliance wiht HEP and ability to verbalize importance of proper posture.  MMT complete with improved LE strengthening all 4+/5 or 5/5.  Pt continues to be limited lumbar mobility though has made improvements each direction all pain free.  Pt continues to have tightness and tenderness with palpation to lumbar paraspinals.  Added stretches to address tightness and soft tissue mobilization was complete to address soft tissue restrictions.  Pt does continue to have limited with balance on Rt LE.  Pt able to verbalize and demonstrate proper mechanics with lifting to simulate RTW, pt demonstrated slow mechanics, will need to increase speed for RTW safety and feels she will not have good mechanics with increased speed.  EOS pt stated pain resolved.    Rehab Potential  Good  PT Frequency  2x / week    PT Duration  6 weeks    PT Treatment/Interventions  ADLs/Self Care Home Management;Aquatic Therapy;Electrical Stimulation;Moist Heat;Cryotherapy;Stair training;Functional mobility training;Therapeutic activities;Balance training;Therapeutic exercise;Neuromuscular re-education;Patient/family education;Manual techniques;Passive range of motion;Dry needling    PT Next Visit Plan  Initiate PA's to lumbar and thoracic spine if with PT. Added paraspinal stretches and manual PRN.  Continue postural strengthening in standing/prone. Continue with functional strengthening and body mechanics education.  Speed up proper lifting to give confidence with task for RTW.  Add planks next session.      PT Home Exercise Plan  Eval: SKTC; bridge, GTB postural strengthening.  7/10: child's pose and POE; 7/17: DKTC       Patient will benefit from skilled therapeutic intervention in order to improve the following deficits and impairments:  Decreased activity tolerance, Decreased endurance, Decreased range of motion, Decreased  strength, Improper body mechanics, Pain, Hypomobility, Decreased balance, Decreased mobility, Postural dysfunction, Impaired flexibility  Visit Diagnosis: Chronic midline low back pain, with sciatica presence unspecified  Other symptoms and signs involving the musculoskeletal system  Other abnormalities of gait and mobility  Abnormal posture     Problem List Patient Active Problem List   Diagnosis Date Noted  . Midline low back pain without sciatica 11/06/2015  . Menopausal symptoms 04/09/2015  . Dysphagia, pharyngoesophageal phase   . Dysphagia 08/21/2014  . Constipation 08/21/2014  . IRREGULAR MENSTRUAL CYCLE 11/15/2007  . VERTIGO, CHRONIC 11/15/2007  . HEADACHE 11/15/2007  . SYNCOPE, HX OF 11/15/2007   Becky Sax, LPTA; CBIS [phone]  Juel Burrow 01/18/2018, 4:35 PM  [facility] [address] [place], [place], [address] Phone: [phone]   Fax:  [phone]  Name: Sandra Davies MRN: [id number] Date of Birth: [birth year]

## 2018-01-24 ENCOUNTER — Encounter (HOSPITAL_COMMUNITY): Payer: Self-pay | Admitting: Physical Therapy

## 2018-01-24 ENCOUNTER — Ambulatory Visit (HOSPITAL_COMMUNITY): Payer: BLUE CROSS/BLUE SHIELD | Admitting: Physical Therapy

## 2018-01-24 DIAGNOSIS — G8929 Other chronic pain: Secondary | ICD-10-CM | POA: Diagnosis not present

## 2018-01-24 DIAGNOSIS — R29898 Other symptoms and signs involving the musculoskeletal system: Secondary | ICD-10-CM | POA: Diagnosis not present

## 2018-01-24 DIAGNOSIS — R293 Abnormal posture: Secondary | ICD-10-CM

## 2018-01-24 DIAGNOSIS — M545 Low back pain: Principal | ICD-10-CM

## 2018-01-24 DIAGNOSIS — R2689 Other abnormalities of gait and mobility: Secondary | ICD-10-CM | POA: Diagnosis not present

## 2018-01-24 NOTE — Therapy (Signed)
[facility] [address] [place], [place], [address] Phone: [phone]   Fax:  [phone]  Physical Therapy Treatment  Patient Details  Name: Sandra Davies MRN: [id number] Date of Birth: May 21, 1961 Referring Provider: Darreld Mclean, MD   Encounter Date: 01/24/2018  PT End of Session - 01/24/18 1524    Visit Number  8    Number of Visits  13    Date for PT Re-Evaluation  02/06/18 Minireassess complete 7/17    Authorization Type  [employer] blue [employer] (60 visit limit (PT/OT/SLP), 1 eval per calender year, re-eval every 21 days) no auth required    Authorization Time Period  12/26/17 - 02/06/18    Authorization - Visit Number  8    Authorization - Number of Visits  60    PT Start Time  1522    PT Stop Time  1610    PT Time Calculation (min)  48 min    Activity Tolerance  Patient tolerated treatment well;No increased pain Reports pain resolved at EOS     Behavior During Therapy  [facility] for tasks assessed/performed       Past Medical History:  Diagnosis Date  . Arthritis   . Constipation - functional    FOR A LONG TIME  . Dysphagia 2015  . GERD (gastroesophageal reflux disease)     Past Surgical History:  Procedure Laterality Date  . ESOPHAGEAL DILATION N/A 09/03/2014   Procedure: ESOPHAGEAL DILATION;  Surgeon: West Bali, MD;  [facility];  Service: Endoscopy;  Laterality: N/A;  . ESOPHAGOGASTRODUODENOSCOPY N/A 09/03/2014   ONG:EXBMWUXL gastritis/dysphagia due to distal esophagel stricture  . ESOPHAGOGASTRODUODENOSCOPY N/A 09/26/2014   KGM:WNUU non-erosive gastritis/stricture at the gastroesphageal  . KNEE SURGERY      There were no vitals filed for this visit.  Subjective Assessment - 01/24/18 1521    Subjective  Patient stated that she is not doing great today. She said she is having 6/10 back pain.     How long can you sit comfortably?  unlimited    How long can you stand comfortably?  Able to stand for 20  minutes, able to complete all dish washing without rest breaks (did state concrete makes worse, when go to job)    How long can you walk comfortably?  Able to walk a mile easily, 30-45 minutes easily (was 1/2 mile)    Patient Stated Goals  ankle swells and goes down fron and back of leg.    Currently in Pain?  Yes    Pain Score  6     Pain Location  Back    Pain Orientation  Lower    Pain Descriptors / Indicators  Dull    Pain Type  Chronic pain    Pain Onset  More than a month ago                       [facility] Adult PT Treatment/Exercise - 01/24/18 0001      Lumbar Exercises: Stretches   Double Knee to Chest Stretch  2 reps;30 seconds      Lumbar Exercises: Standing   Lifting  From floor;From 12";From waist;10 reps;Limitations    Lifting Limitations  With large empty box    Other Standing Lumbar Exercises  Palloff press with read theraband semi-tandem position inside parallel bars x 20 each LE forward. D2 flexion with green theraband 2 x 10 repetitions with verbal cues to improve cervical  spine position. Y facing wall with liftoff with RTB 2x15    Other Standing Lumbar Exercises  Single leg vector stance forward, side, back 5'' holds x 10 repetitins each lower extremity with intermittent upper extremity assistance      Lumbar Exercises: Supine   Other Supine Lumbar Exercises  Lower trunk rotation 10 x 10'' holds      Lumbar Exercises: Prone   Opposite Arm/Leg Raise  Right arm/Left leg;Left arm/Right leg;5 reps;5 seconds;Other (comment) 5x on each arm/leg    Other Prone Lumbar Exercises  Prone on elbows 2 x 1 minute. rows (1#) and shoulder extension 15x    Other Prone Lumbar Exercises  I, T and Y 10x each with 1#               PT Short Term Goals - 01/18/18 1531      PT SHORT TERM GOAL #1   Title  Patient will be independent with HEP, updated PRN, to improve posture, strength, and body mechanics to be abel to return to work tasks with decreased pain.     Baseline  7/17:  Reports compliance with HEP daily    Status  Achieved      PT SHORT TERM GOAL #2   Title  Patient will improve ROM for all limited directions by 8 degrees or up to WNL's to demosntrte significant improvement in ROM to be abel to perform work activities with proper form/body mechanics    Status  On-going      PT SHORT TERM GOAL #3   Title  Patient will demonstrate safe/proper body mechanics with squat and lifting technique to improve safety at work and reduce stress on low back.    Baseline  7/17:  Able to demonstrate proper mechanis with squats and lifting techniques, reports needs to do faster pace for RTW    Status  Achieved      PT SHORT TERM GOAL #4   Title  Patient will improve SLS to 30 seconds on bil LE's to imprvoe safety wtih gait and stair mobiltiy and demonstrate improved balance awareness/procpriocetion for Rt LE.    Baseline  7/17: Lt 60" 1st attempt; Rt 17" max of 3    Status  On-going        PT Long Term Goals - 01/18/18 1606      PT LONG TERM GOAL #2   Title  Patient will demonstrate safe/proper body mechanics with lifting and turning to move heavy glass items at work as well as proper mechanics with pushing/pulling crates to decrease pain and improve safety with return to work.    Baseline  7/17:  Able to demonstrate proper mechanis with squats and lifting techniques, reports needs to do faster pace for RTW      PT LONG TERM GOAL #3   Title  Patient will improve posture while walking and sitting throughout the day at least 50% of her day to improve postural endurance muscel activation to reduce strain on low back.    Baseline  01/18/18: Able to verbalize importance of posture      PT LONG TERM GOAL #4   Title  Patient will have reduced tenderness to PA's of lumbar spine and palpation of paraspinals as well as no muscle guarding with palpation to show reduce hypertonicity and hypersensitivity of lumbar endurance muscles.    Baseline  01/18/18:             Plan - 01/24/18 1632    Clinical Impression Statement  This session continued to progress patient with exercises to improve lumbar flexibility, abdominal strengthening, postural strengthening, and lifting technique. This session added palloff press inside parallel bars with frequent verbal cues and demonstration for form. With Y off the wall, therapist provided patient with tactile cues to pull equally on theraband with left and right upper extremity as patient showed decreased excursion on the right upper extremity. Patient denied any increased pain throughout session. Plan to continue with established plan of care.     Rehab Potential  Good    PT Frequency  2x / week    PT Duration  6 weeks    PT Treatment/Interventions  ADLs/Self Care Home Management;Aquatic Therapy;Electrical Stimulation;Moist Heat;Cryotherapy;Stair training;Functional mobility training;Therapeutic activities;Balance training;Therapeutic exercise;Neuromuscular re-education;Patient/family education;Manual techniques;Passive range of motion;Dry needling    PT Next Visit Plan  Initiate PA's to lumbar and thoracic spine if with PT. Added paraspinal stretches and manual PRN.  Continue postural strengthening in standing/prone. Continue with functional strengthening and body mechanics education.  Speed up proper lifting to give confidence with task for RTW.  Add planks next session.      PT Home Exercise Plan  Eval: SKTC; bridge, GTB postural strengthening.  7/10: child's pose and POE; 7/17: DKTC       Patient will benefit from skilled therapeutic intervention in order to improve the following deficits and impairments:  Decreased activity tolerance, Decreased endurance, Decreased range of motion, Decreased strength, Improper body mechanics, Pain, Hypomobility, Decreased balance, Decreased mobility, Postural dysfunction, Impaired flexibility  Visit Diagnosis: Chronic midline low back pain, with sciatica presence  unspecified  Other symptoms and signs involving the musculoskeletal system  Other abnormalities of gait and mobility  Abnormal posture     Problem List Patient Active Problem List   Diagnosis Date Noted  . Midline low back pain without sciatica 11/06/2015  . Menopausal symptoms 04/09/2015  . Dysphagia, pharyngoesophageal phase   . Dysphagia 08/21/2014  . Constipation 08/21/2014  . IRREGULAR MENSTRUAL CYCLE 11/15/2007  . VERTIGO, CHRONIC 11/15/2007  . HEADACHE 11/15/2007  . SYNCOPE, HX OF 11/15/2007   Verne CarrowMacy Masayuki Sakai PT, DPT 4:54 PM, 01/24/18 [phone]  [facility] [address] [place], [place], [address] Phone: [phone]   Fax:  [phone]  Name: Sandra Davies MRN: [id number] Date of Birth: 05/02/1961

## 2018-01-26 ENCOUNTER — Ambulatory Visit (HOSPITAL_COMMUNITY): Payer: BLUE CROSS/BLUE SHIELD

## 2018-01-26 ENCOUNTER — Other Ambulatory Visit: Payer: Self-pay

## 2018-01-26 ENCOUNTER — Encounter (HOSPITAL_COMMUNITY): Payer: Self-pay

## 2018-01-26 DIAGNOSIS — R293 Abnormal posture: Secondary | ICD-10-CM | POA: Diagnosis not present

## 2018-01-26 DIAGNOSIS — G8929 Other chronic pain: Secondary | ICD-10-CM

## 2018-01-26 DIAGNOSIS — R2689 Other abnormalities of gait and mobility: Secondary | ICD-10-CM | POA: Diagnosis not present

## 2018-01-26 DIAGNOSIS — R29898 Other symptoms and signs involving the musculoskeletal system: Secondary | ICD-10-CM

## 2018-01-26 DIAGNOSIS — M545 Low back pain: Secondary | ICD-10-CM | POA: Diagnosis not present

## 2018-01-26 NOTE — Therapy (Signed)
[facility] [address] [place], [place], [address] Phone: [phone]   Fax:  [phone]  Physical Therapy Treatment  Patient Details  Name: Sandra Davies MRN: [id number] Date of Birth: April 19, 1961 Referring Provider: Darreld Mclean, MD   Encounter Date: 01/26/2018  PT End of Session - 01/26/18 1704    Visit Number  9    Number of Visits  13    Date for PT Re-Evaluation  02/06/18 Minireassess complete 7/17    Authorization Type  [employer] blue Shield (60 visit limit (PT/OT/SLP), 1 eval per calender year, re-eval every 21 days) no auth required    Authorization Time Period  12/26/17 - 02/06/18    Authorization - Visit Number  9    Authorization - Number of Visits  60    PT Start Time  1520    PT Stop Time  1559    PT Time Calculation (min)  39 min    Activity Tolerance  Patient tolerated treatment well Reports pain resolved at EOS     Behavior During Therapy  [facility] for tasks assessed/performed       Past Medical History:  Diagnosis Date  . Arthritis   . Constipation - functional    FOR A LONG TIME  . Dysphagia 2015  . GERD (gastroesophageal reflux disease)     Past Surgical History:  Procedure Laterality Date  . ESOPHAGEAL DILATION N/A 09/03/2014   Procedure: ESOPHAGEAL DILATION;  Surgeon: West Bali, MD;  [facility];  Service: Endoscopy;  Laterality: N/A;  . ESOPHAGOGASTRODUODENOSCOPY N/A 09/03/2014   ONG:EXBMWUXL gastritis/dysphagia due to distal esophagel stricture  . ESOPHAGOGASTRODUODENOSCOPY N/A 09/26/2014   KGM:WNUU non-erosive gastritis/stricture at the gastroesphageal  . KNEE SURGERY      There were no vitals filed for this visit.  Subjective Assessment - 01/26/18 1522    Subjective  Patient reports she is having pain today and she is overwhelemed by all of her family responsibilities.     Limitations  Lifting;Standing;Walking;House hold activities;Other (comment)    How long can you sit  comfortably?  unlimited    How long can you stand comfortably?  Able to stand for 20 minutes, able to complete all dish washing without rest breaks (did state concrete makes worse, when go to job)    How long can you walk comfortably?  Able to walk a mile easily, 30-45 minutes easily (was 1/2 mile)    Patient Stated Goals  ankle swells and goes down fron and back of leg.    Currently in Pain?  Yes    Pain Score  5     Pain Location  Back    Pain Orientation  Lower    Pain Descriptors / Indicators  Aching;Dull    Pain Type  Chronic pain    Pain Onset  More than a month ago    Pain Frequency  Constant    Aggravating Factors   work, housework    Pain Relieving Factors  rest, sitting    Effect of Pain on Daily Activities  currently out of work       [facility] Adult PT Treatment/Exercise - 01/26/18 0001      Lumbar Exercises: Stretches   Single Knee to Chest Stretch  Right;Left;5 reps;20 seconds    Lower Trunk Rotation  10 seconds;Limitations    Lower Trunk Rotation Limitations  10 reps bilaterally      Lumbar Exercises: Prone   Other Prone Lumbar Exercises  Prone press ups to elbows; 10 second holds, 10 reps      Lumbar Exercises: Quadruped   [place]  15 reps    Opposite Arm/Leg Raise  Right arm/Left leg;Left arm/Right leg;10 reps      Manual Therapy   Manual Therapy  Joint mobilization    Manual therapy comments  completed separately from ther ex    Joint Mobilization  PA's grade III to L1-5 and T1-10, 3x 30 seconds oscillations at each segmant        PT Education - 01/26/18 1704    Education Details  educated on interventions and on importance of not over doing her HEP.     Person(s) Educated  Patient    Methods  Explanation    Comprehension  Verbalized understanding       PT Short Term Goals - 01/18/18 1531      PT SHORT TERM GOAL #1   Title  Patient will be independent with HEP, updated PRN, to improve posture, strength, and body mechanics to be abel to return  to work tasks with decreased pain.    Baseline  7/17:  Reports compliance with HEP daily    Status  Achieved      PT SHORT TERM GOAL #2   Title  Patient will improve ROM for all limited directions by 8 degrees or up to WNL's to demosntrte significant improvement in ROM to be abel to perform work activities with proper form/body mechanics    Status  On-going      PT SHORT TERM GOAL #3   Title  Patient will demonstrate safe/proper body mechanics with squat and lifting technique to improve safety at work and reduce stress on low back.    Baseline  7/17:  Able to demonstrate proper mechanis with squats and lifting techniques, reports needs to do faster pace for RTW    Status  Achieved      PT SHORT TERM GOAL #4   Title  Patient will improve SLS to 30 seconds on bil LE's to imprvoe safety wtih gait and stair mobiltiy and demonstrate improved balance awareness/procpriocetion for Rt LE.    Baseline  7/17: Lt 60" 1st attempt; Rt 17" max of 3    Status  On-going        PT Long Term Goals - 01/18/18 1606      PT LONG TERM GOAL #2   Title  Patient will demonstrate safe/proper body mechanics with lifting and turning to move heavy glass items at work as well as proper mechanics with pushing/pulling crates to decrease pain and improve safety with return to work.    Baseline  7/17:  Able to demonstrate proper mechanis with squats and lifting techniques, reports needs to do faster pace for RTW      PT LONG TERM GOAL #3   Title  Patient will improve posture while walking and sitting throughout the day at least 50% of her day to improve postural endurance muscel activation to reduce strain on low back.    Baseline  01/18/18: Able to verbalize importance of posture      PT LONG TERM GOAL #4   Title  Patient will have reduced tenderness to PA's of lumbar spine and palpation of paraspinals as well as no muscle guarding with palpation to show reduce hypertonicity and hypersensitivity of lumbar endurance  muscles.    Baseline  01/18/18:        Plan - 01/26/18 1708    Clinical Impression Statement  This session initiated spinal mobilization to lumbar and thoracic spine to improve mobility. Patient reported positive response to manual treatment with reduction of pain from 5/10 to 3/10. Remainder of session she performed lumbar stretching and stabilization exercises with no difficulty. She has her follow up appointment with Dr. Hilda Lias next week and will be re-assessed in therapy. She will continue to benefit from skilled PT interventiosn to address paina nd improve functional mobility.    Rehab Potential  Good    PT Frequency  2x / week    PT Duration  6 weeks    PT Treatment/Interventions  ADLs/Self Care Home Management;Aquatic Therapy;Electrical Stimulation;Moist Heat;Cryotherapy;Stair training;Functional mobility training;Therapeutic activities;Balance training;Therapeutic exercise;Neuromuscular re-education;Patient/family education;Manual techniques;Passive range of motion;Dry needling    PT Next Visit Plan  Continue PA's to lumbar and thoracic spine if with PT and perform paraspinal stretches and manual PRN.  Continue postural strengthening in standing/prone. Continue with functional strengthening and body mechanics education.  Speed up proper lifting to give confidence with task for RTW.  Add planks next session.      PT Home Exercise Plan  Eval: SKTC; bridge, GTB postural strengthening.  7/10: child's pose and POE; 7/17: DKTC    Consulted and Agree with Plan of Care  Patient       Patient will benefit from skilled therapeutic intervention in order to improve the following deficits and impairments:  Decreased activity tolerance, Decreased endurance, Decreased range of motion, Decreased strength, Improper body mechanics, Pain, Hypomobility, Decreased balance, Decreased mobility, Postural dysfunction, Impaired flexibility  Visit Diagnosis: Chronic midline low back pain, with sciatica presence  unspecified  Other symptoms and signs involving the musculoskeletal system  Other abnormalities of gait and mobility  Abnormal posture     Problem List Patient Active Problem List   Diagnosis Date Noted  . Midline low back pain without sciatica 11/06/2015  . Menopausal symptoms 04/09/2015  . Dysphagia, pharyngoesophageal phase   . Dysphagia 08/21/2014  . Constipation 08/21/2014  . IRREGULAR MENSTRUAL CYCLE 11/15/2007  . VERTIGO, CHRONIC 11/15/2007  . HEADACHE 11/15/2007  . SYNCOPE, HX OF 11/15/2007    Valentino Saxon, PT, DPT Physical Therapist with [facility]  01/26/2018 5:44 PM    [facility] [address] [place], [place], [address] Phone: [phone]   Fax:  [phone]  Name: SANDRALEE TARKINGTON MRN: [id number] Date of Birth: 26-Apr-1961

## 2018-01-31 ENCOUNTER — Ambulatory Visit: Payer: BLUE CROSS/BLUE SHIELD | Admitting: Orthopaedic Surgery

## 2018-01-31 ENCOUNTER — Encounter: Payer: Self-pay | Admitting: Orthopaedic Surgery

## 2018-01-31 ENCOUNTER — Ambulatory Visit (HOSPITAL_COMMUNITY): Payer: BLUE CROSS/BLUE SHIELD | Admitting: Physical Therapy

## 2018-01-31 VITALS — BP 140/83 | HR 90 | Ht 67.5 in | Wt 159.0 lb

## 2018-01-31 DIAGNOSIS — R2689 Other abnormalities of gait and mobility: Secondary | ICD-10-CM | POA: Diagnosis not present

## 2018-01-31 DIAGNOSIS — R293 Abnormal posture: Secondary | ICD-10-CM

## 2018-01-31 DIAGNOSIS — M545 Low back pain: Secondary | ICD-10-CM

## 2018-01-31 DIAGNOSIS — G8929 Other chronic pain: Secondary | ICD-10-CM

## 2018-01-31 DIAGNOSIS — R29898 Other symptoms and signs involving the musculoskeletal system: Secondary | ICD-10-CM

## 2018-01-31 NOTE — Patient Instructions (Signed)
Out of work 

## 2018-01-31 NOTE — Progress Notes (Signed)
Patient ZO:[id number] Sandra Davies, female DOB:[birth year], 57 y.o. WJX:[id number]  Chief Complaint  Patient presents with  . Back Pain    HPI  Sandra Davies is a 57 y.o. female who has continued lower back pain.  She is going to PT and is making slow progress.  I have read their notes.  I will give a TENS unit.  She has no paresthesias.  She is doing her exercises and taking her medicine.   Body mass index is 24.54 kg/m.  ROS  Review of Systems  Constitutional:       Patient does not have Diabetes Mellitus. Patient does not have hypertension. Patient does not have COPD or shortness of breath. Patient does not have BMI > 35. Patient does not have current smoking history.   HENT: Negative for congestion.   Respiratory: Negative for cough and shortness of breath.   Cardiovascular: Negative for chest pain.  Gastrointestinal: Positive for constipation.       GERD  Endocrine: Positive for cold intolerance.  Musculoskeletal: Positive for back pain and joint swelling (right knee).  All other systems reviewed and are negative.   All other systems reviewed and are negative.  Past Medical History:  Diagnosis Date  . Arthritis   . Constipation - functional    FOR A LONG TIME  . Dysphagia 2015  . GERD (gastroesophageal reflux disease)     Past Surgical History:  Procedure Laterality Date  . ESOPHAGEAL DILATION N/A 09/03/2014   Procedure: ESOPHAGEAL DILATION;  Surgeon: West Bali, MD;  [facility];  Service: Endoscopy;  Laterality: N/A;  . ESOPHAGOGASTRODUODENOSCOPY N/A 09/03/2014   OZH:YQMVHQIO gastritis/dysphagia due to distal esophagel stricture  . ESOPHAGOGASTRODUODENOSCOPY N/A 09/26/2014   NGE:XBMW non-erosive gastritis/stricture at the gastroesphageal  . KNEE SURGERY      Family History  Problem Relation Age of Onset  . Breast cancer Mother        DECEASED  . Alzheimer's disease Father   . Heart disease Brother   . Lupus Sister   . Colon polyps Neg Hx    . Colon cancer Neg Hx   . Stomach cancer Neg Hx     Social History Social History   Tobacco Use  . Smoking status: Never Smoker  . Smokeless tobacco: Never Used  Substance Use Topics  . Alcohol use: Yes    Alcohol/week: 0.0 oz    Comment: very occasional  . Drug use: No    No Known Allergies  Current Outpatient Medications  Medication Sig Dispense Refill  . cyclobenzaprine (FLEXERIL) 10 MG tablet Take 1 tablet (10 mg total) by mouth at bedtime. 30 tablet 3  . diclofenac (VOLTAREN) 75 MG EC tablet TAKE 1 TABLET BY MOUTH TWICE A DAY WITH FOOD 60 tablet 5  . diphenhydrAMINE (BENADRYL) 25 MG tablet Take 25 mg by mouth every 6 (six) hours as needed for allergies. Reported on 08/21/2015    . HYDROcodone-acetaminophen (NORCO) 7.5-325 MG tablet One tablet every six hours as needed for pain.  30 day limit. 100 tablet 0  . omeprazole (PRILOSEC) 20 MG capsule TAKE 1 CAPSULE BY MOUTH 30 MINUTES PRIOR TO BREAKFAST AND SUPPER 60 capsule 11  . pseudoephedrine-acetaminophen (TYLENOL SINUS) 30-500 MG TABS tablet Take 1 tablet by mouth every 4 (four) hours as needed.     No current facility-administered medications for this visit.      Physical Exam  Blood pressure 140/83, pulse 90, height 5' 7.5" (1.715 m), weight 159 lb (72.1 kg),  last menstrual period 07/02/2013.  Constitutional: overall normal hygiene, normal nutrition, well developed, normal grooming, normal body habitus. Assistive device:none  Musculoskeletal: gait and station Limp none, muscle tone and strength are normal, no tremors or atrophy is present.  .  Neurological: coordination overall normal.  Deep tendon reflex/nerve stretch intact.  Sensation normal.  Cranial nerves II-XII intact.   Skin:   Normal overall no scars, lesions, ulcers or rashes. No psoriasis.  Psychiatric: Alert and oriented x 3.  Recent memory intact, remote memory unclear.  Normal mood and affect. Well groomed.  Good eye contact.  Cardiovascular:  overall no swelling, no varicosities, no edema bilaterally, normal temperatures of the legs and arms, no clubbing, cyanosis and good capillary refill.  Lymphatic: palpation is normal.  Spine/Pelvis examination:  Inspection:  Overall, sacoiliac joint benign and hips nontender; without crepitus or defects.   Thoracic spine inspection: Alignment normal without kyphosis present   Lumbar spine inspection:  Alignment  with normal lumbar lordosis, without scoliosis apparent.   Thoracic spine palpation:  without tenderness of spinal processes   Lumbar spine palpation: without tenderness of lumbar area; without tightness of lumbar muscles    Range of Motion:   Lumbar flexion, forward flexion is normal without pain or tenderness    Lumbar extension is full without pain or tenderness   Left lateral bend is normal without pain or tenderness   Right lateral bend is normal without pain or tenderness   Straight leg raising is normal  Strength & tone: normal   Stability overall normal stability All other systems reviewed and are negative   The patient has been educated about the nature of the problem(s) and counseled on treatment options.  The patient appeared to understand what I have discussed and is in agreement with it.  Encounter Diagnosis  Name Primary?  . Chronic midline low back pain without sciatica Yes    PLAN Call if any problems.  Precautions discussed.  Continue current medications.   Return to clinic 2 weeks   Stay out of work  Continue PT.  Electronically Signed Darreld McleanWayne Alta Goding, MD 7/30/20191:49 PM

## 2018-01-31 NOTE — Therapy (Signed)
[facility] [address] [place], [place], [address] Phone: [phone]   Fax:  [phone]  Physical Therapy Treatment  Patient Details  Name: Sandra Davies MRN: [id number] Date of Birth: Dec 02, 1960 Referring Provider: Darreld Mclean, MD   Encounter Date: 01/31/2018  PT End of Session - 01/31/18 1600    Visit Number  10    Number of Visits  13    Date for PT Re-Evaluation  02/06/18 Minireassess complete 7/17    Authorization Type  [employer] blue [employer] (60 visit limit (PT/OT/SLP), 1 eval per calender year, re-eval every 21 days) no auth required    Authorization Time Period  12/26/17 - 02/06/18    Authorization - Visit Number  10    Authorization - Number of Visits  60    PT Start Time  1518    PT Stop Time  1600    PT Time Calculation (min)  42 min    Activity Tolerance  Patient tolerated treatment well Reports pain resolved at EOS     Behavior During Therapy  [facility] for tasks assessed/performed       Past Medical History:  Diagnosis Date  . Arthritis   . Constipation - functional    FOR A LONG TIME  . Dysphagia 2015  . GERD (gastroesophageal reflux disease)     Past Surgical History:  Procedure Laterality Date  . ESOPHAGEAL DILATION N/A 09/03/2014   Procedure: ESOPHAGEAL DILATION;  Surgeon: West Bali, MD;  [facility];  Service: Endoscopy;  Laterality: N/A;  . ESOPHAGOGASTRODUODENOSCOPY N/A 09/03/2014   OVF:IEPPIRJJ gastritis/dysphagia due to distal esophagel stricture  . ESOPHAGOGASTRODUODENOSCOPY N/A 09/26/2014   OAC:ZYSA non-erosive gastritis/stricture at the gastroesphageal  . KNEE SURGERY      There were no vitals filed for this visit.  Subjective Assessment - 01/31/18 1520    Subjective  Pt states about 4/10 pain today.  Went to Dr. Hilda Lias today and returns with script for TENS unit.  PT will continue to be out of work until returns to MD 02/14/18.       Currently in Pain?  Yes    Pain Score  4     Pain Location  Back    Pain Orientation  Lower    Pain Descriptors / Indicators  Aching    Pain Type  Chronic pain                       [facility] Adult PT Treatment/Exercise - 01/31/18 0001      Lumbar Exercises: Standing   Scapular Retraction  Strengthening;Both;15 reps;Theraband    Theraband Level (Scapular Retraction)  Level 3 (Green)    Row  Strengthening;15 reps;Both;Theraband    Theraband Level (Row)  Level 3 (Green)    Shoulder Extension  Strengthening;Both;15 reps;Theraband    Theraband Level (Shoulder Extension)  Level 2 (Red);Level 3 (Green)      Lumbar Exercises: Prone   Other Prone Lumbar Exercises  I, T and Y 15x each with 1#      Lumbar Exercises: Quadruped   Madcat/Old Horse  15 reps    Opposite Arm/Leg Raise  Right arm/Left leg;Left arm/Right leg;10 reps      Manual Therapy   Manual Therapy  Soft tissue mobilization    Manual therapy comments  completed in prone at end of session    Soft tissue mobilization  to low back region to decrease tightness, restrictions  PT Short Term Goals - 01/18/18 1531      PT SHORT TERM GOAL #1   Title  Patient will be independent with HEP, updated PRN, to improve posture, strength, and body mechanics to be abel to return to work tasks with decreased pain.    Baseline  7/17:  Reports compliance with HEP daily    Status  Achieved      PT SHORT TERM GOAL #2   Title  Patient will improve ROM for all limited directions by 8 degrees or up to WNL's to demosntrte significant improvement in ROM to be abel to perform work activities with proper form/body mechanics    Status  On-going      PT SHORT TERM GOAL #3   Title  Patient will demonstrate safe/proper body mechanics with squat and lifting technique to improve safety at work and reduce stress on low back.    Baseline  7/17:  Able to demonstrate proper mechanis with squats and lifting techniques, reports needs to do faster pace for RTW    Status   Achieved      PT SHORT TERM GOAL #4   Title  Patient will improve SLS to 30 seconds on bil LE's to imprvoe safety wtih gait and stair mobiltiy and demonstrate improved balance awareness/procpriocetion for Rt LE.    Baseline  7/17: Lt 60" 1st attempt; Rt 17" max of 3    Status  On-going        PT Long Term Goals - 01/18/18 1606      PT LONG TERM GOAL #2   Title  Patient will demonstrate safe/proper body mechanics with lifting and turning to move heavy glass items at work as well as proper mechanics with pushing/pulling crates to decrease pain and improve safety with return to work.    Baseline  7/17:  Able to demonstrate proper mechanis with squats and lifting techniques, reports needs to do faster pace for RTW      PT LONG TERM GOAL #3   Title  Patient will improve posture while walking and sitting throughout the day at least 50% of her day to improve postural endurance muscel activation to reduce strain on low back.    Baseline  01/18/18: Able to verbalize importance of posture      PT LONG TERM GOAL #4   Title  Patient will have reduced tenderness to PA's of lumbar spine and palpation of paraspinals as well as no muscle guarding with palpation to show reduce hypertonicity and hypersensitivity of lumbar endurance muscles.    Baseline  01/18/18:            Plan - 01/31/18 1600    Clinical Impression Statement  continued with postural and core strengthening.  PT able to complete with cues for form as she could not recall some instructed.  Resumed theraband exercise for postural mm and pt requried cues to complete more slowly, controlled.  Completed session with manual to lumbar regionwith tightness noted bilaterally.  Pt reported feeling better at end of session.  Educated pateint to [facility] or order online, whichever preferred as our clinic no longer issues these.  Pt verbalized understanding.      Rehab Potential  Good    PT Frequency  2x / week    PT  Duration  6 weeks    PT Treatment/Interventions  ADLs/Self Care Home Management;Aquatic Therapy;Electrical Stimulation;Moist Heat;Cryotherapy;Stair training;Functional mobility training;Therapeutic activities;Balance training;Therapeutic exercise;Neuromuscular re-education;Patient/family education;Manual techniques;Passive range of motion;Dry needling  PT Next Visit Plan  Continue PA's to lumbar and thoracic spine if with PT and perform paraspinal stretches and manual PRN.  Continue postural strengthening in standing/prone. Continue with functional strengthening and body mechanics education.  Speed up proper lifting to give confidence with task for RTW.  Add planks next session.      PT Home Exercise Plan  Eval: SKTC; bridge, GTB postural strengthening.  7/10: child's pose and POE; 7/17: DKTC    Consulted and Agree with Plan of Care  Patient       Patient will benefit from skilled therapeutic intervention in order to improve the following deficits and impairments:  Decreased activity tolerance, Decreased endurance, Decreased range of motion, Decreased strength, Improper body mechanics, Pain, Hypomobility, Decreased balance, Decreased mobility, Postural dysfunction, Impaired flexibility  Visit Diagnosis: Chronic midline low back pain, with sciatica presence unspecified  Other symptoms and signs involving the musculoskeletal system  Other abnormalities of gait and mobility  Abnormal posture     Problem List Patient Active Problem List   Diagnosis Date Noted  . Midline low back pain without sciatica 11/06/2015  . Menopausal symptoms 04/09/2015  . Dysphagia, pharyngoesophageal phase   . Dysphagia 08/21/2014  . Constipation 08/21/2014  . IRREGULAR MENSTRUAL CYCLE 11/15/2007  . VERTIGO, CHRONIC 11/15/2007  . HEADACHE 11/15/2007  . SYNCOPE, HX OF 11/15/2007   Lurena Nida, PTA/CLT [phone]  Lurena Nida 01/31/2018, 4:04 PM  [facility] [address] [place], [place], [address] Phone: [phone]   Fax:  [phone]  Name: Sandra Davies MRN: [id number] Date of Birth: 07-16-1960

## 2018-02-01 ENCOUNTER — Ambulatory Visit (HOSPITAL_COMMUNITY): Payer: BLUE CROSS/BLUE SHIELD | Admitting: Physical Therapy

## 2018-02-01 ENCOUNTER — Other Ambulatory Visit: Payer: Self-pay | Admitting: Orthopaedic Surgery

## 2018-02-01 DIAGNOSIS — G8929 Other chronic pain: Secondary | ICD-10-CM

## 2018-02-01 DIAGNOSIS — R2689 Other abnormalities of gait and mobility: Secondary | ICD-10-CM

## 2018-02-01 DIAGNOSIS — M545 Low back pain: Principal | ICD-10-CM

## 2018-02-01 DIAGNOSIS — R293 Abnormal posture: Secondary | ICD-10-CM

## 2018-02-01 DIAGNOSIS — R29898 Other symptoms and signs involving the musculoskeletal system: Secondary | ICD-10-CM

## 2018-02-01 NOTE — Therapy (Signed)
[facility] [address] [place], [place], [address] Phone: [phone]   Fax:  [phone]  Physical Therapy Treatment  Patient Details  Name: Sandra Davies MRN: [id number] Date of Birth: 1961/04/13 Referring Provider: Darreld Mclean, MD   Encounter Date: 02/01/2018  PT End of Session - 02/01/18 1759    Visit Number  11    Number of Visits  13    Date for PT Re-Evaluation  02/06/18 Minireassess complete 7/17    Authorization Type  [employer] blue Shield (60 visit limit (PT/OT/SLP), 1 eval per calender year, re-eval every 21 days) no auth required    Authorization Time Period  12/26/17 - 02/06/18    Authorization - Visit Number  11    Authorization - Number of Visits  60    PT Start Time  1520    PT Stop Time  1600    PT Time Calculation (min)  40 min    Activity Tolerance  Patient tolerated treatment well Reports pain resolved at EOS     Behavior During Therapy  [facility] for tasks assessed/performed       Past Medical History:  Diagnosis Date  . Arthritis   . Constipation - functional    FOR A LONG TIME  . Dysphagia 2015  . GERD (gastroesophageal reflux disease)     Past Surgical History:  Procedure Laterality Date  . ESOPHAGEAL DILATION N/A 09/03/2014   Procedure: ESOPHAGEAL DILATION;  Surgeon: West Bali, MD;  [facility];  Service: Endoscopy;  Laterality: N/A;  . ESOPHAGOGASTRODUODENOSCOPY N/A 09/03/2014   ONG:EXBMWUXL gastritis/dysphagia due to distal esophagel stricture  . ESOPHAGOGASTRODUODENOSCOPY N/A 09/26/2014   KGM:WNUU non-erosive gastritis/stricture at the gastroesphageal  . KNEE SURGERY      There were no vitals filed for this visit.  Subjective Assessment - 01/31/18 1520    Subjective  Pt states about 4/10 pain today.  Went to Dr. Hilda Lias today and returns with script for TENS unit.  PT will continue to be out of work until returns to MD 02/14/18.       Currently in Pain?  Yes    Pain Score  4      Pain Location  Back    Pain Orientation  Lower    Pain Descriptors / Indicators  Aching    Pain Type  Chronic pain                       [facility] Adult PT Treatment/Exercise - 02/01/18 0001      Lumbar Exercises: Stretches   Single Knee to Chest Stretch  Right;Left;5 reps;20 seconds    Lower Trunk Rotation  10 seconds;Limitations    Lower Trunk Rotation Limitations  10 reps bilaterally      Lumbar Exercises: Supine   Bridge  10 reps    Straight Leg Raise  10 reps      Lumbar Exercises: Prone   Other Prone Lumbar Exercises  planks 5X10"    Other Prone Lumbar Exercises  I, T, Y, retractions 15x each with 1#      Lumbar Exercises: Quadruped   Opposite Arm/Leg Raise  Right arm/Left leg;Left arm/Right leg;10 reps      Manual Therapy   Manual Therapy  Soft tissue mobilization    Manual therapy comments  completed in prone at end of session    Soft tissue mobilization  to low back region to decrease tightness, restrictions  PT Short Term Goals - 01/18/18 1531      PT SHORT TERM GOAL #1   Title  Patient will be independent with HEP, updated PRN, to improve posture, strength, and body mechanics to be abel to return to work tasks with decreased pain.    Baseline  7/17:  Reports compliance with HEP daily    Status  Achieved      PT SHORT TERM GOAL #2   Title  Patient will improve ROM for all limited directions by 8 degrees or up to WNL's to demosntrte significant improvement in ROM to be abel to perform work activities with proper form/body mechanics    Status  On-going      PT SHORT TERM GOAL #3   Title  Patient will demonstrate safe/proper body mechanics with squat and lifting technique to improve safety at work and reduce stress on low back.    Baseline  7/17:  Able to demonstrate proper mechanis with squats and lifting techniques, reports needs to do faster pace for RTW    Status  Achieved      PT SHORT TERM GOAL #4   Title  Patient will  improve SLS to 30 seconds on bil LE's to imprvoe safety wtih gait and stair mobiltiy and demonstrate improved balance awareness/procpriocetion for Rt LE.    Baseline  7/17: Lt 60" 1st attempt; Rt 17" max of 3    Status  On-going        PT Long Term Goals - 01/18/18 1606      PT LONG TERM GOAL #2   Title  Patient will demonstrate safe/proper body mechanics with lifting and turning to move heavy glass items at work as well as proper mechanics with pushing/pulling crates to decrease pain and improve safety with return to work.    Baseline  7/17:  Able to demonstrate proper mechanis with squats and lifting techniques, reports needs to do faster pace for RTW      PT LONG TERM GOAL #3   Title  Patient will improve posture while walking and sitting throughout the day at least 50% of her day to improve postural endurance muscel activation to reduce strain on low back.    Baseline  01/18/18: Able to verbalize importance of posture      PT LONG TERM GOAL #4   Title  Patient will have reduced tenderness to PA's of lumbar spine and palpation of paraspinals as well as no muscle guarding with palpation to show reduce hypertonicity and hypersensitivity of lumbar endurance muscles.    Baseline  01/18/18:            Plan - 02/01/18 1800    Clinical Impression Statement  Focus remained on postural and core strengthening this session.  Added planks with tactile cues to keep body in alignment as tends to poke bottom up toward sky.  PT still has not address getting her TENS unit but plans on getting that done this week.  Paraspinals remain tense with general reduction obtained through manual.  Overall improving without complaints during or after session.     Rehab Potential  Good    PT Frequency  2x / week    PT Duration  6 weeks    PT Treatment/Interventions  ADLs/Self Care Home Management;Aquatic Therapy;Electrical Stimulation;Moist Heat;Cryotherapy;Stair training;Functional mobility training;Therapeutic  activities;Balance training;Therapeutic exercise;Neuromuscular re-education;Patient/family education;Manual techniques;Passive range of motion;Dry needling    PT Next Visit Plan  Continue PA's to lumbar and thoracic spine if with PT and perform  paraspinal stretches and manual PRN.  Continue postural strengthening in standing/prone. Continue with functional strengthening and body mechanics education.  Speed up proper lifting to give confidence with task for RTW.     PT Home Exercise Plan  Eval: SKTC; bridge, GTB postural strengthening.  7/10: child's pose and POE; 7/17: DKTC    Consulted and Agree with Plan of Care  Patient       Patient will benefit from skilled therapeutic intervention in order to improve the following deficits and impairments:  Decreased activity tolerance, Decreased endurance, Decreased range of motion, Decreased strength, Improper body mechanics, Pain, Hypomobility, Decreased balance, Decreased mobility, Postural dysfunction, Impaired flexibility  Visit Diagnosis: Chronic midline low back pain, with sciatica presence unspecified  Other symptoms and signs involving the musculoskeletal system  Other abnormalities of gait and mobility  Abnormal posture     Problem List Patient Active Problem List   Diagnosis Date Noted  . Midline low back pain without sciatica 11/06/2015  . Menopausal symptoms 04/09/2015  . Dysphagia, pharyngoesophageal phase   . Dysphagia 08/21/2014  . Constipation 08/21/2014  . IRREGULAR MENSTRUAL CYCLE 11/15/2007  . VERTIGO, CHRONIC 11/15/2007  . HEADACHE 11/15/2007  . SYNCOPE, HX OF 11/15/2007   Lurena Nida, PTA/CLT [phone]  Lurena Nida 02/01/2018, 6:02 PM  [facility] [address] [place], [place], [address] Phone: [phone]   Fax:  [phone]  Name: ROSHELL BRIGHAM MRN: [id number] Date of Birth: Oct 17, 1960

## 2018-02-06 MED ORDER — HYDROCODONE-ACETAMINOPHEN 7.5-325 MG PO TABS: ORAL_TABLET | ORAL | 0 refills | Status: DC

## 2018-02-07 ENCOUNTER — Other Ambulatory Visit: Payer: Self-pay

## 2018-02-07 ENCOUNTER — Encounter (HOSPITAL_COMMUNITY): Payer: Self-pay

## 2018-02-07 ENCOUNTER — Ambulatory Visit (HOSPITAL_COMMUNITY): Payer: BLUE CROSS/BLUE SHIELD | Attending: Orthopaedic Surgery

## 2018-02-07 DIAGNOSIS — R293 Abnormal posture: Secondary | ICD-10-CM | POA: Insufficient documentation

## 2018-02-07 DIAGNOSIS — G8929 Other chronic pain: Secondary | ICD-10-CM | POA: Insufficient documentation

## 2018-02-07 DIAGNOSIS — R29898 Other symptoms and signs involving the musculoskeletal system: Secondary | ICD-10-CM | POA: Diagnosis not present

## 2018-02-07 DIAGNOSIS — M545 Low back pain: Secondary | ICD-10-CM | POA: Insufficient documentation

## 2018-02-07 DIAGNOSIS — R2689 Other abnormalities of gait and mobility: Secondary | ICD-10-CM | POA: Diagnosis not present

## 2018-02-07 NOTE — Therapy (Signed)
[place], [place], [address] Phone: [phone]   Fax:  [phone]  Physical Therapy Treatment/Discharge Summary  Patient Details  Name: Sandra Davies MRN: [id number] Date of Birth: 26-Jul-1960 Referring Provider: Sanjuana Kava MD   Encounter Date: 02/07/2018   PHYSICAL THERAPY DISCHARGE SUMMARY  Visits from Start of Care: 12  Current functional level related to goals / functional outcomes: Re-assessment performed this session and patient has made excellent progress towards goals. She has met/partially met all short term goals and all long term goals with exception of self reported improvement via FOTO. She stated she has noticed a big improvement in her pain and that she feels confident and independent with her HEP. She is returning to her MD on 02/14/18 and will discuss when she would like to return to work with him. She has been educated on benefits of regular exercise routine and provided handout for local fitness facilities. She was also educated on benefits of massage therapy to reduce restrictions of muscle tightness in her back. Sandra Davies has reported feeling confident in continuing to progress independently and will be discharged following this session.   Remaining deficits: See below details.   Education / Equipment: Educated on progress towards goals and readiness for discharge at this time. Educated on benefits of gym membership for regular exercise to promote to maintain strength and promote health/wellness and provided information on local massage therapists to address muscle soreness as needed.  Plan: Patient agrees to discharge.  Patient goals were partially met. Patient is being discharged due to meeting the stated rehab goals.  ?????       PT End of Session - 02/07/18 1439    Visit Number  12    Number of Visits  13    Date for PT Re-Evaluation  02/06/18 Minireassess complete 7/17    Authorization Type  [employer] (60 visit limit (PT/OT/SLP), 1 eval per calender year, re-eval every 21 days) no auth required    Authorization Time Period  12/26/17 - 02/06/18    Authorization - Visit Number  12    Authorization - Number of Visits  60    PT Start Time  9509    PT Stop Time  1503    PT Time Calculation (min)  29 min    Activity Tolerance  Patient tolerated treatment well    Behavior During Therapy  WFL for tasks assessed/performed       Past Medical History:  Diagnosis Date  . Arthritis   . Constipation - functional    FOR A LONG TIME  . Dysphagia 2015  . GERD (gastroesophageal reflux disease)     Past Surgical History:  Procedure Laterality Date  . ESOPHAGEAL DILATION N/A 09/03/2014   Procedure: ESOPHAGEAL DILATION;  Surgeon: Danie Binder, MD;  [facility];  Service: Endoscopy;  Laterality: N/A;  . ESOPHAGOGASTRODUODENOSCOPY N/A 09/03/2014   TOI:ZTIWPYKD gastritis/dysphagia due to distal esophagel stricture  . ESOPHAGOGASTRODUODENOSCOPY N/A 09/26/2014   XIP:JASN non-erosive gastritis/stricture at the gastroesphageal  . KNEE SURGERY      There were no vitals filed for this visit.  Subjective Assessment - 02/07/18 1437    Subjective  Patient reports she is planning to go get TENS unit to manage her pain and that she is going to pick it up at [employer]. She reports her back is better but if she stands for long periods she needs to bend her knees to  weight shift. She reports her stretches and it helps with her pain.      Limitations  Lifting;Standing;Walking;House hold activities;Other (comment)    How long can you sit comfortably?  unlimited    How long can you stand comfortably?  if weight hsifting her knees that helps (bedn her knees)     How long can you walk comfortably?  Able to walk a mile easily, 30-45 minutes easily (was 1/2 mile)    Patient Stated Goals  ankle swells and goes down fron and back of leg.    Currently in Pain?   No/denies        [facility] PT Assessment - 02/07/18 0001      Assessment   Medical Diagnosis  Low Back Pain    Referring Provider  Sanjuana Kava MD    Next MD Visit  02/14/18    Prior Therapy  yes, 1-2 years ago here for LBP      Precautions   Precautions  None      Restrictions   Weight Bearing Restrictions  No      Balance Screen   Has the patient fallen in the past 6 months  No    Has the patient had a decrease in activity level because of a fear of falling?   No    Is the patient reluctant to leave their home because of a fear of falling?   No      Prior Function   Level of Independence  Independent with basic ADLs;Independent      Cognition   Overall Cognitive Status  Within Functional Limits for tasks assessed      Observation/Other Assessments   Focus on Therapeutic Outcomes (FOTO)   43% limited was 50% limited on 12/26/17      Single Leg Stance   Comments  Rt LE = 20; Lt LE = 30      Other:   Other/ Comments  Lifting mechanics: patient able to demonstrate good mechanics with floor to chest lifting 10lb in box, 10 repetitions      Posture/Postural Control   Posture Comments  Patient with improved posture throughtuo sessions, decresaed rounded shoulders      AROM   Lumbar Flexion  70 was 38 degress on 12/26/17    Lumbar Extension  24 was 14 degress on 12/26/17    Lumbar - Right Side Bend  24 was 18 degress on 12/26/17    Lumbar - Left Side Bend  22 was 22 degress on 12/26/17    Lumbar - Right Rotation  8 was 6 degress on 12/26/17    Lumbar - Left Rotation  8 was 8 degress on 12/26/17      Strength   Right Hip Flexion  5/5 was 4+ on 12/26/17    Right Hip Extension  5/5 was 4+ on 12/26/17    Right Hip ABduction  5/5 was 4+ on 12/26/17    Left Hip Flexion  5/5 was 4+ on 12/26/17    Left Hip Extension  5/5 was 4+ on 12/26/17    Left Hip ABduction  5/5 was 4+ on 12/26/17    Right Knee Flexion  5/5 was 4 on 12/26/17    Right Knee Extension  5/5 was 4+ on 12/26/17    Left Knee  Flexion  5/5 was 4+ on 12/26/17    Left Knee Extension  5/5 was 4+ on 12/26/17    Right Ankle Dorsiflexion  5/5 was 4+ on 12/26/17  Left Ankle Dorsiflexion  5/5 was 4+ on 12/26/17      Palpation   Spinal mobility  normal mobility with PA's to lumbar spine and patient reporting redcued tenderness and denying pain    Palpation comment  patietn reprots mild tenderness but denies pain across lumbar paraspinals and gluteal muscles         PT Education - 02/07/18 1518    Education Details  Educated on progress towards goals and readiness for discharge at this time. Educated on benefits of gym membership for regular exercise to promote to maintain strength and promote health/wellness and provided information on local massage therapists to address muscle soreness as needed.    Person(s) Educated  Patient    Methods  Explanation;Handout    Comprehension  Verbalized understanding       PT Short Term Goals - 02/07/18 1526      PT SHORT TERM GOAL #1   Title  Patient will be independent with HEP, updated PRN, to improve posture, strength, and body mechanics to be abel to return to work tasks with decreased pain.    Baseline  7/17:  Reports compliance with HEP daily    Status  Achieved      PT SHORT TERM GOAL #2   Title  Patient will improve ROM for all limited directions by 8 degrees or up to WNL's to demosntrte significant improvement in ROM to be abel to perform work activities with proper form/body mechanics    Baseline  all improved significantly or WNL's    Time  3    Period  Weeks    Status  Achieved      PT SHORT TERM GOAL #3   Title  Patient will demonstrate safe/proper body mechanics with squat and lifting technique to improve safety at work and reduce stress on low back.    Time  3    Period  Weeks    Status  Achieved      PT SHORT TERM GOAL #4   Title  Patient will improve SLS to 30 seconds on bil LE's to improve safety wtih gait and stair mobiltiy and demonstrate improved  balance awareness/procpriocetion for Rt LE.    Baseline  not met for Rt LE    Time  3    Period  Weeks    Status  Partially Met        PT Long Term Goals - 02/07/18 1447      PT LONG TERM GOAL #1   Title  Patient will improve FOTO by 12% or more to demosntrate significant improvement in self reported functional performance and decreased limitations.    Baseline  7%    Time  6    Period  Weeks    Status  Not Met      PT LONG TERM GOAL #2   Title  Patient will demonstrate safe/proper body mechanics with lifting and turning to move heavy glass items at work as well as proper mechanics with pushing/pulling crates to decrease pain and improve safety with return to work.    Baseline  7/17:  Able to demonstrate proper mechanis with squats and lifting techniques, reports needs to do faster pace for RTW    Status  Achieved      PT LONG TERM GOAL #3   Title  Patient will improve posture while walking and sitting throughout the day at least 50% of her day to improve postural endurance muscel activation to reduce strain on low back.  Baseline  01/18/18: Able to verbalize importance of posture    Status  Achieved      PT LONG TERM GOAL #4   Title  Patient will have reduced tenderness to PA's of lumbar spine and palpation of paraspinals as well as no muscle guarding with palpation to show reduce hypertonicity and hypersensitivity of lumbar endurance muscles.    Baseline  01/18/18:    Status  Achieved        Plan - 02/07/18 1439    Clinical Impression Statement  Re-assessment performed this session and patient has made excellent progress towards goals. She has met/partially met all short term goals and all long term goals with exception of self reported improvement via FOTO. She stated she has noticed a big improvement in her pain and that she feels confident and independent with her HEP. She is returning to her MD on 02/14/18 and will discuss when she would like to return to work with him. She  has been educated on benefits of regular exercise routine and provided handout for local fitness facilities. She was also educated on benefits of massage therapy to reduce restrictions of muscle tightness in her back. Sandra Davies has reported feeling confident in continuing to progress independently and will be discharged following this session.    Rehab Potential  Good    PT Frequency  2x / week    PT Duration  6 weeks    PT Treatment/Interventions  ADLs/Self Care Home Management;Aquatic Therapy;Electrical Stimulation;Moist Heat;Cryotherapy;Stair training;Functional mobility training;Therapeutic activities;Balance training;Therapeutic exercise;Neuromuscular re-education;Patient/family education;Manual techniques;Passive range of motion;Dry needling    PT Next Visit Plan  Discharge this session.    PT Home Exercise Plan  Eval: SKTC; bridge, GTB postural strengthening.  7/10: child's pose and POE; 7/17: DKTC    Consulted and Agree with Plan of Care  Patient       Patient will benefit from skilled therapeutic intervention in order to improve the following deficits and impairments:  Decreased activity tolerance, Decreased endurance, Decreased range of motion, Decreased strength, Improper body mechanics, Pain, Hypomobility, Decreased balance, Decreased mobility, Postural dysfunction, Impaired flexibility  Visit Diagnosis: Chronic midline low back pain, with sciatica presence unspecified  Other symptoms and signs involving the musculoskeletal system  Other abnormalities of gait and mobility  Abnormal posture     Problem List Patient Active Problem List   Diagnosis Date Noted  . Midline low back pain without sciatica 11/06/2015  . Menopausal symptoms 04/09/2015  . Dysphagia, pharyngoesophageal phase   . Dysphagia 08/21/2014  . Constipation 08/21/2014  . IRREGULAR MENSTRUAL CYCLE 11/15/2007  . VERTIGO, CHRONIC 11/15/2007  . HEADACHE 11/15/2007  . SYNCOPE, HX OF 11/15/2007     Kipp Brood, PT, DPT Physical Therapist with [facility]  02/07/2018 3:26 PM    [place], [place], [address] Phone: [phone]   Fax:  [phone]  Name: Sandra Davies MRN: [id number] Date of Birth: 28-Dec-1960

## 2018-02-09 ENCOUNTER — Encounter (HOSPITAL_COMMUNITY): Payer: BLUE CROSS/BLUE SHIELD

## 2018-02-14 ENCOUNTER — Ambulatory Visit: Payer: BLUE CROSS/BLUE SHIELD | Admitting: Orthopaedic Surgery

## 2018-02-14 ENCOUNTER — Encounter: Payer: Self-pay | Admitting: Orthopaedic Surgery

## 2018-02-14 VITALS — BP 141/96 | HR 95 | Ht 67.5 in | Wt 155.0 lb

## 2018-02-14 DIAGNOSIS — M545 Low back pain: Secondary | ICD-10-CM | POA: Diagnosis not present

## 2018-02-14 DIAGNOSIS — G8929 Other chronic pain: Secondary | ICD-10-CM | POA: Diagnosis not present

## 2018-02-14 NOTE — Patient Instructions (Signed)
Out of work 

## 2018-02-14 NOTE — Progress Notes (Signed)
[name], Davies DOB:11/24/1960, 57 y.o. WJX:[id number]  Chief Complaint  Patient presents with  . Back Pain    better after physical therapy     HPI  [name] is a 57 y.o. Davies who has chronic lower back pain.  She has been to PT and is improved but still not ready for work yet. She is doing her exercises at home.  I have read the PT reports.  She has no paresthesias.  I have gone over exercises with her today.   Body mass index is 23.92 kg/m.  ROS  Review of Systems  Constitutional:       Patient does not have Diabetes Mellitus. Patient does not have hypertension. Patient does not have COPD or shortness of breath. Patient does not have BMI > 35. Patient does not have current smoking history.   HENT: Negative for congestion.   Respiratory: Negative for cough and shortness of breath.   Cardiovascular: Negative for chest pain.  Gastrointestinal: Positive for constipation.       GERD  Endocrine: Positive for cold intolerance.  Musculoskeletal: Positive for back pain and joint swelling (right knee).  All other systems reviewed and are negative.   All other systems reviewed and are negative.  Past Medical History:  Diagnosis Date  . Arthritis   . Constipation - functional    FOR A LONG TIME  . Dysphagia 2015  . GERD (gastroesophageal reflux disease)     Past Surgical History:  Procedure Laterality Date  . ESOPHAGEAL DILATION N/A 09/03/2014   Procedure: ESOPHAGEAL DILATION;  Surgeon: West BaliSandi L Fields, MD;  [facility]: [facility];  Service: Endoscopy;  Laterality: N/A;  . ESOPHAGOGASTRODUODENOSCOPY N/A 09/03/2014   OZH:YQMVHQIOSLF:moderate gastritis/dysphagia due to distal esophagel stricture  . ESOPHAGOGASTRODUODENOSCOPY N/A 09/26/2014   NGE:XBMWSLF:mild non-erosive gastritis/stricture at the gastroesphageal  . KNEE SURGERY      Family History  Problem Relation Age of Onset  . Breast cancer Mother        DECEASED  . Alzheimer's disease Father   . Heart  disease Brother   . Lupus Sister   . Colon polyps Neg Hx   . Colon cancer Neg Hx   . Stomach cancer Neg Hx     Social History Social History   Tobacco Use  . Smoking status: Never Smoker  . Smokeless tobacco: Never Used  Substance Use Topics  . Alcohol use: Yes    Alcohol/week: 0.0 standard drinks    Comment: very occasional  . Drug use: No    No Known Allergies  Current Outpatient Medications  Medication Sig Dispense Refill  . cyclobenzaprine (FLEXERIL) 10 MG tablet Take 1 tablet (10 mg total) by mouth at bedtime. 30 tablet 3  . diclofenac (VOLTAREN) 75 MG EC tablet TAKE 1 TABLET BY MOUTH TWICE A DAY WITH FOOD 60 tablet 5  . diphenhydrAMINE (BENADRYL) 25 MG tablet Take 25 mg by mouth every 6 (six) hours as needed for allergies. Reported on 08/21/2015    . HYDROcodone-acetaminophen (NORCO) 7.5-325 MG tablet One tablet every six hours as needed for pain.  30 day limit. 100 tablet 0  . omeprazole (PRILOSEC) 20 MG capsule TAKE 1 CAPSULE BY MOUTH 30 MINUTES PRIOR TO BREAKFAST AND SUPPER 60 capsule 11  . pseudoephedrine-acetaminophen (TYLENOL SINUS) 30-500 MG TABS tablet Take 1 tablet by mouth every 4 (four) hours as needed.     No current facility-administered medications for this visit.      Physical Exam  Blood  pressure (!) 141/96, pulse 95, height 5' 7.5" (1.715 m), weight 155 lb (70.3 kg), last menstrual period 07/02/2013.  Constitutional: overall normal hygiene, normal nutrition, well developed, normal grooming, normal body habitus. Assistive device:none  Musculoskeletal: gait and station Limp none, muscle tone and strength are normal, no tremors or atrophy is present.  .  Neurological: coordination overall normal.  Deep tendon reflex/nerve stretch intact.  Sensation normal.  Cranial nerves II-XII intact.   Skin:   Normal overall no scars, lesions, ulcers or rashes. No psoriasis.  Psychiatric: Alert and oriented x 3.  Recent memory intact, remote memory unclear.   Normal mood and affect. Well groomed.  Good eye contact.  Cardiovascular: overall no swelling, no varicosities, no edema bilaterally, normal temperatures of the legs and arms, no clubbing, cyanosis and good capillary refill.  Lymphatic: palpation is normal.  Spine/Pelvis examination:  Inspection:  Overall, sacoiliac joint benign and hips nontender; without crepitus or defects.   Thoracic spine inspection: Alignment normal without kyphosis present   Lumbar spine inspection:  Alignment  with normal lumbar lordosis, without scoliosis apparent.   Thoracic spine palpation:  without tenderness of spinal processes   Lumbar spine palpation: without tenderness of lumbar area; without tightness of lumbar muscles    Range of Motion:   Lumbar flexion, forward flexion is normal without pain or tenderness    Lumbar extension is full without pain or tenderness   Left lateral bend is normal without pain or tenderness   Right lateral bend is normal without pain or tenderness   Straight leg raising is normal  Strength & tone: normal   Stability overall normal stability All other systems reviewed and are negative   The patient has been educated about the nature of the problem(s) and counseled on treatment options.  The patient appeared to understand what I have discussed and is in agreement with it.  Encounter Diagnosis  Name Primary?  . Chronic midline low back pain without sciatica Yes    PLAN Call if any problems.  Precautions discussed.  Continue current medications.   Return to clinic 2 weeks   Out of work  Electronically Signed Darreld McleanWayne Willie Plain, MD 8/13/20191:44 PM

## 2018-02-22 ENCOUNTER — Ambulatory Visit: Payer: BLUE CROSS/BLUE SHIELD | Admitting: Orthopaedic Surgery

## 2018-02-28 ENCOUNTER — Ambulatory Visit: Payer: BLUE CROSS/BLUE SHIELD | Admitting: Orthopaedic Surgery

## 2018-02-28 ENCOUNTER — Encounter: Payer: Self-pay | Admitting: Orthopaedic Surgery

## 2018-02-28 ENCOUNTER — Other Ambulatory Visit: Payer: Self-pay

## 2018-02-28 VITALS — BP 130/91 | HR 97 | Ht 67.5 in | Wt 159.0 lb

## 2018-02-28 DIAGNOSIS — G8929 Other chronic pain: Secondary | ICD-10-CM | POA: Diagnosis not present

## 2018-02-28 DIAGNOSIS — M545 Low back pain, unspecified: Secondary | ICD-10-CM

## 2018-02-28 DIAGNOSIS — M25561 Pain in right knee: Secondary | ICD-10-CM

## 2018-02-28 MED ORDER — HYDROCODONE-ACETAMINOPHEN 7.5-325 MG PO TABS: ORAL_TABLET | ORAL | 0 refills | Status: DC

## 2018-02-28 NOTE — Patient Instructions (Signed)
Return to work March 20, 2018  Continue exercises.

## 2018-02-28 NOTE — Progress Notes (Signed)
Patient JY:NWGNFAOZHY:Sandra Davies, female DOB:11/03/1960, 57 y.o. QMV:[id number]  Chief Complaint  Patient presents with  . Back Pain    HPI  [name] is a 57 y.o. female who continues to have lower back pain. She has been to PT.  She is doing her exercises at home. She has no weakness.  It has been cool the last few days and is raining today, she has more pain.  I have stressed her continuation of her exercises.  She is taking her medicine.   Body mass index is 24.54 kg/m.  ROS  Review of Systems  Constitutional: Positive for activity change.       Patient does not have Diabetes Mellitus. Patient does not have hypertension. Patient does not have COPD or shortness of breath. Patient does not have BMI > 35. Patient does not have current smoking history.   HENT: Negative for congestion.   Respiratory: Negative for cough and shortness of breath.   Cardiovascular: Negative for chest pain.  Gastrointestinal: Positive for constipation.       GERD  Endocrine: Positive for cold intolerance.  Musculoskeletal: Positive for back pain and joint swelling (right knee).  All other systems reviewed and are negative.   All other systems reviewed and are negative.  The following is a summary of the past history medically, past history surgically, known current medicines, social history and family history.  This information is gathered electronically by the computer from prior information and documentation.  I review this each visit and have found including this information at this point in the chart is beneficial and informative.    Past Medical History:  Diagnosis Date  . Arthritis   . Constipation - functional    FOR A LONG TIME  . Dysphagia 2015  . GERD (gastroesophageal reflux disease)     Past Surgical History:  Procedure Laterality Date  . ESOPHAGEAL DILATION N/A 09/03/2014   Procedure: ESOPHAGEAL DILATION;  Surgeon: West BaliSandi L Fields, MD;  [facility];  Service:  Endoscopy;  Laterality: N/A;  . ESOPHAGOGASTRODUODENOSCOPY N/A 09/03/2014   MWU:[id number]:moderate gastritis/dysphagia due to distal esophagel stricture  . ESOPHAGOGASTRODUODENOSCOPY N/A 09/26/2014   UVO:ZDGUSLF:mild non-erosive gastritis/stricture at the gastroesphageal  . KNEE SURGERY      Family History  Problem Relation Age of Onset  . Breast cancer Mother        DECEASED  . Alzheimer's disease Father   . Heart disease Brother   . Lupus Sister   . Colon polyps Neg Hx   . Colon cancer Neg Hx   . Stomach cancer Neg Hx     Social History Social History   Tobacco Use  . Smoking status: Never Smoker  . Smokeless tobacco: Never Used  Substance Use Topics  . Alcohol use: Yes    Alcohol/week: 0.0 standard drinks    Comment: very occasional  . Drug use: No    No Known Allergies  Current Outpatient Medications  Medication Sig Dispense Refill  . cyclobenzaprine (FLEXERIL) 10 MG tablet Take 1 tablet (10 mg total) by mouth at bedtime. 30 tablet 3  . diclofenac (VOLTAREN) 75 MG EC tablet TAKE 1 TABLET BY MOUTH TWICE A DAY WITH FOOD 60 tablet 5  . diphenhydrAMINE (BENADRYL) 25 MG tablet Take 25 mg by mouth every 6 (six) hours as needed for allergies. Reported on 08/21/2015    . HYDROcodone-acetaminophen (NORCO) 7.5-325 MG tablet One tablet every six hours as needed for pain.  30 day limit. 100 tablet 0  .  omeprazole (PRILOSEC) 20 MG capsule TAKE 1 CAPSULE BY MOUTH 30 MINUTES PRIOR TO BREAKFAST AND SUPPER 60 capsule 11  . pseudoephedrine-acetaminophen (TYLENOL SINUS) 30-500 MG TABS tablet Take 1 tablet by mouth every 4 (four) hours as needed.     No current facility-administered medications for this visit.      Physical Exam  Blood pressure (!) 130/91, pulse 97, height 5' 7.5" (1.715 m), weight 159 lb (72.1 kg), last menstrual period 07/02/2013.  Constitutional: overall normal hygiene, normal nutrition, well developed, normal grooming, normal body habitus. Assistive device:none  Musculoskeletal:  gait and station Limp none, muscle tone and strength are normal, no tremors or atrophy is present.  .  Neurological: coordination overall normal.  Deep tendon reflex/nerve stretch intact.  Sensation normal.  Cranial nerves II-XII intact.   Skin:   Normal overall no scars, lesions, ulcers or rashes. No psoriasis.  Psychiatric: Alert and oriented x 3.  Recent memory intact, remote memory unclear.  Normal mood and affect. Well groomed.  Good eye contact.  Cardiovascular: overall no swelling, no varicosities, no edema bilaterally, normal temperatures of the legs and arms, no clubbing, cyanosis and good capillary refill.  Lymphatic: palpation is normal.  Spine/Pelvis examination:  Inspection:  Overall, sacoiliac joint benign and hips nontender; without crepitus or defects.   Thoracic spine inspection: Alignment normal without kyphosis present   Lumbar spine inspection:  Alignment  with normal lumbar lordosis, without scoliosis apparent.   Thoracic spine palpation:  without tenderness of spinal processes   Lumbar spine palpation: with tenderness of lumbar area; without tightness of lumbar muscles    Range of Motion:   Lumbar flexion, forward flexion is 35 without pain or tenderness    Lumbar extension is 10 without pain or tenderness   Left lateral bend is Normal  without pain or tenderness   Right lateral bend is Normal without pain or tenderness   Straight leg raising is Normal   Strength & tone: Normal   Stability overall normal stability    All other systems reviewed and are negative   The patient has been educated about the nature of the problem(s) and counseled on treatment options.  The patient appeared to understand what I have discussed and is in agreement with it.  Encounter Diagnoses  Name Primary?  . Chronic midline low back pain without sciatica Yes  . Chronic pain of right knee     PLAN Call if any problems.  Precautions discussed.  Continue current medications.    Return to clinic 6 weeks   Return to work March 20, 2018.  Electronically Signed Darreld Mclean, MD 8/27/20191:31 PM

## 2018-03-16 ENCOUNTER — Telehealth: Payer: Self-pay | Admitting: Orthopaedic Surgery

## 2018-03-16 NOTE — Telephone Encounter (Signed)
Sandra PihJackie has a note to return to work on Monday, 03/20/18.  She requested the out of work note be extended until at least 04/03/18.  May we give an out of work note through the 30th?  Thanks

## 2018-03-20 ENCOUNTER — Encounter: Payer: Self-pay | Admitting: Orthopaedic Surgery

## 2018-03-20 NOTE — Telephone Encounter (Signed)
ok 

## 2018-04-03 ENCOUNTER — Other Ambulatory Visit: Payer: Self-pay

## 2018-04-04 MED ORDER — HYDROCODONE-ACETAMINOPHEN 7.5-325 MG PO TABS: ORAL_TABLET | ORAL | 0 refills | Status: DC

## 2018-04-11 ENCOUNTER — Ambulatory Visit: Payer: BLUE CROSS/BLUE SHIELD | Admitting: Orthopaedic Surgery

## 2018-04-30 ENCOUNTER — Other Ambulatory Visit: Payer: Self-pay | Admitting: Orthopaedic Surgery

## 2018-05-02 ENCOUNTER — Other Ambulatory Visit: Payer: Self-pay

## 2018-05-02 ENCOUNTER — Ambulatory Visit: Payer: BLUE CROSS/BLUE SHIELD | Admitting: Orthopaedic Surgery

## 2018-05-02 ENCOUNTER — Encounter: Payer: Self-pay | Admitting: Orthopaedic Surgery

## 2018-05-02 VITALS — BP 151/95 | HR 112 | Ht 67.5 in | Wt 156.0 lb

## 2018-05-02 DIAGNOSIS — G8929 Other chronic pain: Secondary | ICD-10-CM

## 2018-05-02 DIAGNOSIS — M25561 Pain in right knee: Secondary | ICD-10-CM

## 2018-05-02 NOTE — Patient Instructions (Signed)
Out of work 

## 2018-05-02 NOTE — Progress Notes (Signed)
CC:  I have pain of my right knee. I would like an injection.  The patient has chronic pain of the right knee.  There is no recent trauma.  There is no redness.  Injections in the past have helped.  The knee has no redness, has an effusion and crepitus present.  ROM of the right knee is 0-105.  Impression:  Chronic knee pain right  Return: 3 weeks  PROCEDURE NOTE:  The patient requests injections of the right knee , verbal consent was obtained.  The right knee was prepped appropriately after time out was performed.   Sterile technique was observed and injection of 1 cc of Depo-Medrol 40 mg with several cc's of plain xylocaine. Anesthesia was provided by ethyl chloride and a 20-gauge needle was used to inject the knee area. The injection was tolerated well.  A band aid dressing was applied.  The patient was advised to apply ice later today and tomorrow to the injection sight as needed.  Electronically Signed Darreld Mclean, MD 10/29/20191:35 PM

## 2018-05-03 MED ORDER — HYDROCODONE-ACETAMINOPHEN 7.5-325 MG PO TABS: ORAL_TABLET | ORAL | 0 refills | Status: DC

## 2018-05-23 ENCOUNTER — Ambulatory Visit: Payer: BLUE CROSS/BLUE SHIELD | Admitting: Orthopaedic Surgery

## 2018-05-23 ENCOUNTER — Encounter: Payer: Self-pay | Admitting: Orthopaedic Surgery

## 2018-05-23 VITALS — BP 136/89 | HR 98 | Ht 67.5 in | Wt 148.0 lb

## 2018-05-23 DIAGNOSIS — M25561 Pain in right knee: Secondary | ICD-10-CM | POA: Diagnosis not present

## 2018-05-23 DIAGNOSIS — M545 Low back pain: Secondary | ICD-10-CM

## 2018-05-23 DIAGNOSIS — G8929 Other chronic pain: Secondary | ICD-10-CM | POA: Diagnosis not present

## 2018-05-23 NOTE — Patient Instructions (Signed)
Return to work June 05, 2018, full time.

## 2018-05-23 NOTE — Progress Notes (Signed)
Patient WU:JWJXBJYNWG Sandra Davies, female DOB:Sep 22, 1960, 57 y.o. NFA:[id number]  Chief Complaint  Patient presents with  . Knee Pain    Right knee pain.    HPI  Sandra Davies is a 57 y.o. female who has chronic lower back pain and knee pain on the right.  She has less pain in the lower back.  She has had flare up with the cold rainy weather but she is doing her exercises and taking her medicine.  I will have her return to full duty December 2nd.  Her right knee has swelling and popping but no giving way.  She has no new trauma.  She is taking her medicine.  She has no redness.   Body mass index is 22.84 kg/m.  ROS  Review of Systems  Constitutional: Positive for activity change.       Patient does not have Diabetes Mellitus. Patient does not have hypertension. Patient does not have COPD or shortness of breath. Patient does not have BMI > 35. Patient does not have current smoking history.   HENT: Negative for congestion.   Respiratory: Negative for cough and shortness of breath.   Cardiovascular: Negative for chest pain.  Gastrointestinal: Positive for constipation.       GERD  Endocrine: Positive for cold intolerance.  Musculoskeletal: Positive for back pain and joint swelling (right knee).  All other systems reviewed and are negative.   All other systems reviewed and are negative.  The following is a summary of the past history medically, past history surgically, known current medicines, social history and family history.  This information is gathered electronically by the computer from prior information and documentation.  I review this each visit and have found including this information at this point in the chart is beneficial and informative.    Past Medical History:  Diagnosis Date  . Arthritis   . Constipation - functional    FOR A LONG TIME  . Dysphagia 2015  . GERD (gastroesophageal reflux disease)     Past Surgical History:  Procedure Laterality Date  .  ESOPHAGEAL DILATION N/A 09/03/2014   Procedure: ESOPHAGEAL DILATION;  Surgeon: West Bali, MD;  Location: [facility];  Service: Endoscopy;  Laterality: N/A;  . ESOPHAGOGASTRODUODENOSCOPY N/A 09/03/2014   ION:GEXBMWUX gastritis/dysphagia due to distal esophagel stricture  . ESOPHAGOGASTRODUODENOSCOPY N/A 09/26/2014   LKG:MWNU non-erosive gastritis/stricture at the gastroesphageal  . KNEE SURGERY      Family History  Problem Relation Age of Onset  . Breast cancer Mother        DECEASED  . Alzheimer's disease Father   . Heart disease Brother   . Lupus Sister   . Colon polyps Neg Hx   . Colon cancer Neg Hx   . Stomach cancer Neg Hx     Social History Social History   Tobacco Use  . Smoking status: Never Smoker  . Smokeless tobacco: Never Used  Substance Use Topics  . Alcohol use: Yes    Alcohol/week: 0.0 standard drinks    Comment: very occasional  . Drug use: No    No Known Allergies  Current Outpatient Medications  Medication Sig Dispense Refill  . cyclobenzaprine (FLEXERIL) 10 MG tablet TAKE 1 TABLET BY MOUTH EVERYDAY AT BEDTIME 30 tablet 3  . diclofenac (VOLTAREN) 75 MG EC tablet TAKE 1 TABLET BY MOUTH TWICE A DAY WITH FOOD 60 tablet 5  . diphenhydrAMINE (BENADRYL) 25 MG tablet Take 25 mg by mouth every 6 (six) hours as needed  for allergies. Reported on 08/21/2015    . HYDROcodone-acetaminophen (NORCO) 7.5-325 MG tablet One tablet every six hours as needed for pain.  30 day limit. 100 tablet 0  . omeprazole (PRILOSEC) 20 MG capsule TAKE 1 CAPSULE BY MOUTH 30 MINUTES PRIOR TO BREAKFAST AND SUPPER 60 capsule 11  . pseudoephedrine-acetaminophen (TYLENOL SINUS) 30-500 MG TABS tablet Take 1 tablet by mouth every 4 (four) hours as needed.     No current facility-administered medications for this visit.      Physical Exam  Blood pressure 136/89, pulse 98, height 5' 7.5" (1.715 m), weight 148 lb (67.1 kg), last menstrual period 07/02/2013.  Constitutional: overall normal  hygiene, normal nutrition, well developed, normal grooming, normal body habitus. Assistive device:none  Musculoskeletal: gait and station Limp right, muscle tone and strength are normal, no tremors or atrophy is present.  .  Neurological: coordination overall normal.  Deep tendon reflex/nerve stretch intact.  Sensation normal.  Cranial nerves II-XII intact.   Skin:   Normal overall no scars, lesions, ulcers or rashes. No psoriasis.  Psychiatric: Alert and oriented x 3.  Recent memory intact, remote memory unclear.  Normal mood and affect. Well groomed.  Good eye contact.  Cardiovascular: overall no swelling, no varicosities, no edema bilaterally, normal temperatures of the legs and arms, no clubbing, cyanosis and good capillary refill.  Lymphatic: palpation is normal.  Right knee with diffuse tenderness, some slight effusion, crepitus, ROM 0 to 110, limp to the right.  NV intact.  Spine/Pelvis examination:  Inspection:  Overall, sacoiliac joint benign and hips nontender; without crepitus or defects.   Thoracic spine inspection: Alignment normal without kyphosis present   Lumbar spine inspection:  Alignment  with normal lumbar lordosis, without scoliosis apparent.   Thoracic spine palpation:  without tenderness of spinal processes   Lumbar spine palpation: without tenderness of lumbar area; without tightness of lumbar muscles    Range of Motion:   Lumbar flexion, forward flexion is normal without pain or tenderness    Lumbar extension is full without pain or tenderness   Left lateral bend is normal without pain or tenderness   Right lateral bend is normal without pain or tenderness   Straight leg raising is normal  Strength & tone: normal   Stability overall normal stability  All other systems reviewed and are negative   The patient has been educated about the nature of the problem(s) and counseled on treatment options.  The patient appeared to understand what I have discussed  and is in agreement with it.  Encounter Diagnoses  Name Primary?  . Chronic midline low back pain without sciatica   . Chronic pain of right knee Yes    PLAN Call if any problems.  Precautions discussed.  Continue current medications.   Return to clinic 1 month   Return to work December 2  Electronically Signed Darreld McleanWayne Harlo Jaso, MD 11/19/20191:44 PM

## 2018-05-29 ENCOUNTER — Other Ambulatory Visit: Payer: Self-pay | Admitting: Orthopedic Surgery

## 2018-06-08 ENCOUNTER — Telehealth: Payer: Self-pay | Admitting: Orthopaedic Surgery

## 2018-06-08 ENCOUNTER — Encounter: Payer: Self-pay | Admitting: Orthopaedic Surgery

## 2018-06-08 ENCOUNTER — Ambulatory Visit: Payer: BLUE CROSS/BLUE SHIELD | Admitting: Orthopaedic Surgery

## 2018-06-08 VITALS — BP 141/93 | HR 100 | Ht 67.5 in | Wt 150.0 lb

## 2018-06-08 DIAGNOSIS — M25561 Pain in right knee: Secondary | ICD-10-CM | POA: Diagnosis not present

## 2018-06-08 DIAGNOSIS — G8929 Other chronic pain: Secondary | ICD-10-CM | POA: Diagnosis not present

## 2018-06-08 MED ORDER — HYDROCODONE-ACETAMINOPHEN 7.5-325 MG PO TABS: ORAL_TABLET | ORAL | 0 refills | Status: DC

## 2018-06-08 NOTE — Telephone Encounter (Signed)
Hydrocodone-Acetaminophen 7.5/325 mg  Qty 100 Tablets  PATIENT USES [facility] 

## 2018-06-08 NOTE — Patient Instructions (Signed)
OUT OF WORK ?

## 2018-06-08 NOTE — Progress Notes (Signed)
CC:  I have pain of my right knee. I would like an injection.  The patient has chronic pain of the right knee.  There is no recent trauma.  There is no redness.  Injections in the past have helped.  The knee has no redness, has an effusion and crepitus present.  ROM of the right knee is 0-100.  Impression:  Chronic knee pain right  Return: 2 weeks  PROCEDURE NOTE:  The patient requests injections of the right knee , verbal consent was obtained.  The right knee was prepped appropriately after time out was performed.   Sterile technique was observed and injection of 1 cc of Depo-Medrol 40 mg with several cc's of plain xylocaine. Anesthesia was provided by ethyl chloride and a 20-gauge needle was used to inject the knee area. The injection was tolerated well.  A band aid dressing was applied.  The patient was advised to apply ice later today and tomorrow to the injection sight as needed.  Stay out of work.  I have reviewed the [place] Controlled Substance Reporting System web site prior to prescribing narcotic medicine for this patient.   Electronically Signed Darreld McleanWayne Jaison Petraglia, MD 12/5/20199:40 AM

## 2018-06-20 ENCOUNTER — Ambulatory Visit: Payer: BLUE CROSS/BLUE SHIELD | Admitting: Orthopaedic Surgery

## 2018-06-21 DIAGNOSIS — R51 Headache: Secondary | ICD-10-CM | POA: Diagnosis not present

## 2018-06-21 DIAGNOSIS — N951 Menopausal and female climacteric states: Secondary | ICD-10-CM | POA: Diagnosis not present

## 2018-06-21 DIAGNOSIS — R42 Dizziness and giddiness: Secondary | ICD-10-CM | POA: Diagnosis not present

## 2018-06-21 DIAGNOSIS — N898 Other specified noninflammatory disorders of vagina: Secondary | ICD-10-CM | POA: Diagnosis not present

## 2018-06-21 DIAGNOSIS — R55 Syncope and collapse: Secondary | ICD-10-CM | POA: Diagnosis not present

## 2018-06-22 ENCOUNTER — Ambulatory Visit: Payer: BLUE CROSS/BLUE SHIELD | Admitting: Orthopaedic Surgery

## 2018-06-22 ENCOUNTER — Encounter: Payer: Self-pay | Admitting: Orthopaedic Surgery

## 2018-06-22 ENCOUNTER — Other Ambulatory Visit: Payer: Self-pay

## 2018-06-22 VITALS — BP 165/93 | HR 111 | Ht 67.5 in | Wt 150.0 lb

## 2018-06-22 DIAGNOSIS — G8929 Other chronic pain: Secondary | ICD-10-CM

## 2018-06-22 DIAGNOSIS — M25561 Pain in right knee: Secondary | ICD-10-CM

## 2018-06-22 MED ORDER — CYCLOBENZAPRINE HCL 10 MG PO TABS: ORAL_TABLET | ORAL | 3 refills | Status: DC

## 2018-06-22 NOTE — Patient Instructions (Signed)
Out of work 

## 2018-06-22 NOTE — Progress Notes (Signed)
[name], female DOB:12/05/1960, 57 y.o. WJX:[id number]  Chief Complaint  Patient presents with  . Knee Pain    right    HPI  Sandra Davies is a 57 y.o. female who has chronic pain of the right knee that is somewhat better.  She has pain and swelling still.  She has no giving way.  She has no redness or numbness.  She has pain with the cold weather and prolonged standing or walking.  She is taking her medicine.   Body mass index is 23.15 kg/m.  ROS  Review of Systems  Constitutional: Positive for activity change.       Patient does not have Diabetes Mellitus. Patient does not have hypertension. Patient does not have COPD or shortness of breath. Patient does not have BMI > 35. Patient does not have current smoking history.   HENT: Negative for congestion.   Respiratory: Negative for cough and shortness of breath.   Cardiovascular: Negative for chest pain.  Gastrointestinal: Positive for constipation.       GERD  Endocrine: Positive for cold intolerance.  Musculoskeletal: Positive for back pain and joint swelling (right knee).  All other systems reviewed and are negative.   All other systems reviewed and are negative.  The following is a summary of the past history medically, past history surgically, known current medicines, social history and family history.  This information is gathered electronically by the computer from prior information and documentation.  I review this each visit and have found including this information at this point in the chart is beneficial and informative.    Past Medical History:  Diagnosis Date  . Arthritis   . Constipation - functional    FOR A LONG TIME  . Dysphagia 2015  . GERD (gastroesophageal reflux disease)     Past Surgical History:  Procedure Laterality Date  . ESOPHAGEAL DILATION N/A 09/03/2014   Procedure: ESOPHAGEAL DILATION;  Surgeon: West BaliSandi L Fields, MD;  Location: [facility];  Service: Endoscopy;   Laterality: N/A;  . ESOPHAGOGASTRODUODENOSCOPY N/A 09/03/2014   OZH:YQMVHQIOSLF:moderate gastritis/dysphagia due to distal esophagel stricture  . ESOPHAGOGASTRODUODENOSCOPY N/A 09/26/2014   NGE:XBMWSLF:mild non-erosive gastritis/stricture at the gastroesphageal  . KNEE SURGERY      Family History  Problem Relation Age of Onset  . Breast cancer Mother        DECEASED  . Alzheimer's disease Father   . Heart disease Brother   . Lupus Sister   . Colon polyps Neg Hx   . Colon cancer Neg Hx   . Stomach cancer Neg Hx     Social History Social History   Tobacco Use  . Smoking status: Never Smoker  . Smokeless tobacco: Never Used  Substance Use Topics  . Alcohol use: Yes    Alcohol/week: 0.0 standard drinks    Comment: very occasional  . Drug use: No    No Known Allergies  Current Outpatient Medications  Medication Sig Dispense Refill  . cyclobenzaprine (FLEXERIL) 10 MG tablet TAKE 1 TABLET BY MOUTH EVERYDAY AT BEDTIME 30 tablet 3  . diclofenac (VOLTAREN) 75 MG EC tablet TAKE 1 TABLET BY MOUTH TWICE A DAY WITH FOOD 180 tablet 1  . diphenhydrAMINE (BENADRYL) 25 MG tablet Take 25 mg by mouth every 6 (six) hours as needed for allergies. Reported on 08/21/2015    . HYDROcodone-acetaminophen (NORCO) 7.5-325 MG tablet One tablet every six hours as needed for pain.  30 day limit. 100 tablet 0  . omeprazole (  PRILOSEC) 20 MG capsule TAKE 1 CAPSULE BY MOUTH 30 MINUTES PRIOR TO BREAKFAST AND SUPPER 60 capsule 11  . pseudoephedrine-acetaminophen (TYLENOL SINUS) 30-500 MG TABS tablet Take 1 tablet by mouth every 4 (four) hours as needed.     No current facility-administered medications for this visit.      Physical Exam  Blood pressure (!) 165/93, pulse (!) 111, height 5' 7.5" (1.715 m), weight 150 lb (68 kg), last menstrual period 07/02/2013.  Constitutional: overall normal hygiene, normal nutrition, well developed, normal grooming, normal body habitus. Assistive device:none  Musculoskeletal: gait and  station Limp right, muscle tone and strength are normal, no tremors or atrophy is present.  .  Neurological: coordination overall normal.  Deep tendon reflex/nerve stretch intact.  Sensation normal.  Cranial nerves II-XII intact.   Skin:   Normal overall no scars, lesions, ulcers or rashes. No psoriasis.  Psychiatric: Alert and oriented x 3.  Recent memory intact, remote memory unclear.  Normal mood and affect. Well groomed.  Good eye contact.  Cardiovascular: overall no swelling, no varicosities, no edema bilaterally, normal temperatures of the legs and arms, no clubbing, cyanosis and good capillary refill.  Lymphatic: palpation is normal.  Right knee has slight effusion, ROM 0 to 105, crepitus, limp right, NV intact.  Knee is stable.  All other systems reviewed and are negative   The patient has been educated about the nature of the problem(s) and counseled on treatment options.  The patient appeared to understand what I have discussed and is in agreement with it.  Encounter Diagnosis  Name Primary?  . Chronic pain of right knee Yes    PLAN Call if any problems.  Precautions discussed.  Continue current medications.   Return to clinic 3 weeks   Out of work.  Electronically Signed Darreld McleanWayne Dreya Buhrman, MD 12/19/20199:50 AM

## 2018-06-24 DIAGNOSIS — G894 Chronic pain syndrome: Secondary | ICD-10-CM | POA: Diagnosis not present

## 2018-06-24 DIAGNOSIS — R55 Syncope and collapse: Secondary | ICD-10-CM | POA: Diagnosis not present

## 2018-06-24 DIAGNOSIS — N959 Unspecified menopausal and perimenopausal disorder: Secondary | ICD-10-CM | POA: Diagnosis not present

## 2018-06-24 DIAGNOSIS — R42 Dizziness and giddiness: Secondary | ICD-10-CM | POA: Diagnosis not present

## 2018-07-08 DIAGNOSIS — Z124 Encounter for screening for malignant neoplasm of cervix: Secondary | ICD-10-CM | POA: Diagnosis not present

## 2018-07-08 DIAGNOSIS — R5383 Other fatigue: Secondary | ICD-10-CM | POA: Diagnosis not present

## 2018-07-10 ENCOUNTER — Telehealth: Payer: Self-pay | Admitting: Orthopaedic Surgery

## 2018-07-10 MED ORDER — HYDROCODONE-ACETAMINOPHEN 7.5-325 MG PO TABS: ORAL_TABLET | ORAL | 0 refills | Status: DC

## 2018-07-10 NOTE — Telephone Encounter (Signed)
Patient requests refill on Hydrocodone/Acetaminophen 7.5-325 mgs.   Qty  100    Sig: One tablet every six hours as needed for pain. 30 day limit.  [facility] [place]

## 2018-07-13 ENCOUNTER — Ambulatory Visit: Payer: BLUE CROSS/BLUE SHIELD | Admitting: Orthopaedic Surgery

## 2018-07-13 ENCOUNTER — Encounter: Payer: Self-pay | Admitting: Orthopaedic Surgery

## 2018-07-13 ENCOUNTER — Ambulatory Visit (INDEPENDENT_AMBULATORY_CARE_PROVIDER_SITE_OTHER): Payer: BLUE CROSS/BLUE SHIELD

## 2018-07-13 VITALS — BP 134/91 | HR 91 | Ht 67.5 in | Wt 150.0 lb

## 2018-07-13 DIAGNOSIS — M25571 Pain in right ankle and joints of right foot: Secondary | ICD-10-CM

## 2018-07-13 NOTE — Progress Notes (Signed)
[name], female DOB:12/13/1960, 58 y.o. WJX:[id number]  Chief Complaint  Patient presents with  . Ankle Pain    right   . Fall    x2 dizzy / black out spells has discussed with eye doctor and PCP     HPI  [name] is a 58 y.o. female who has right ankle pain.  She had a blacking out spell times two related to blood pressure problems.  She has seen her family doctor about this. She hurt her right ankle during one of the black outs.  She has lateral swelling and pain.  She has pain medicine.    Body mass index is 23.15 kg/m.  ROS  Review of Systems  Constitutional: Positive for activity change.       Patient does not have Diabetes Mellitus. Patient does not have hypertension. Patient does not have COPD or shortness of breath. Patient does not have BMI > 35. Patient does not have current smoking history.   HENT: Negative for congestion.   Respiratory: Negative for cough and shortness of breath.   Cardiovascular: Negative for chest pain.  Gastrointestinal: Positive for constipation.       GERD  Endocrine: Positive for cold intolerance.  Musculoskeletal: Positive for back pain and joint swelling (right knee).  All other systems reviewed and are negative.   All other systems reviewed and are negative.  The following is a summary of the past history medically, past history surgically, known current medicines, social history and family history.  This information is gathered electronically by the computer from prior information and documentation.  I review this each visit and have found including this information at this point in the chart is beneficial and informative.    Past Medical History:  Diagnosis Date  . Arthritis   . Constipation - functional    FOR A LONG TIME  . Dysphagia 2015  . GERD (gastroesophageal reflux disease)     Past Surgical History:  Procedure Laterality Date  . ESOPHAGEAL DILATION N/A 09/03/2014   Procedure: ESOPHAGEAL  DILATION;  Surgeon: West BaliSandi L Fields, MD;  Location: [facility];  Service: Endoscopy;  Laterality: N/A;  . ESOPHAGOGASTRODUODENOSCOPY N/A 09/03/2014   OZH:YQMVHQIOSLF:moderate gastritis/dysphagia due to distal esophagel stricture  . ESOPHAGOGASTRODUODENOSCOPY N/A 09/26/2014   NGE:XBMWSLF:mild non-erosive gastritis/stricture at the gastroesphageal  . KNEE SURGERY      Family History  Problem Relation Age of Onset  . Breast cancer Mother        DECEASED  . Alzheimer's disease Father   . Heart disease Brother   . Lupus Sister   . Colon polyps Neg Hx   . Colon cancer Neg Hx   . Stomach cancer Neg Hx     Social History Social History   Tobacco Use  . Smoking status: Never Smoker  . Smokeless tobacco: Never Used  Substance Use Topics  . Alcohol use: Yes    Alcohol/week: 0.0 standard drinks    Comment: very occasional  . Drug use: No    No Known Allergies  Current Outpatient Medications  Medication Sig Dispense Refill  . cyclobenzaprine (FLEXERIL) 10 MG tablet TAKE 1 TABLET BY MOUTH EVERYDAY AT BEDTIME 30 tablet 3  . diclofenac (VOLTAREN) 75 MG EC tablet TAKE 1 TABLET BY MOUTH TWICE A DAY WITH FOOD 180 tablet 1  . diphenhydrAMINE (BENADRYL) 25 MG tablet Take 25 mg by mouth every 6 (six) hours as needed for allergies. Reported on 08/21/2015    . HYDROcodone-acetaminophen (NORCO)  7.5-325 MG tablet One tablet every six hours as needed for pain.  30 day limit. 100 tablet 0  . omeprazole (PRILOSEC) 20 MG capsule TAKE 1 CAPSULE BY MOUTH 30 MINUTES PRIOR TO BREAKFAST AND SUPPER 60 capsule 11  . pseudoephedrine-acetaminophen (TYLENOL SINUS) 30-500 MG TABS tablet Take 1 tablet by mouth every 4 (four) hours as needed.     No current facility-administered medications for this visit.      Physical Exam  Blood pressure (!) 134/91, pulse 91, height 5' 7.5" (1.715 m), weight 150 lb (68 kg), last menstrual period 07/02/2013.  Constitutional: overall normal hygiene, normal nutrition, well developed, normal  grooming, normal body habitus. Assistive device:none  Musculoskeletal: gait and station Limp right, muscle tone and strength are normal, no tremors or atrophy is present.  .  Neurological: coordination overall normal.  Deep tendon reflex/nerve stretch intact.  Sensation normal.  Cranial nerves II-XII intact.   Skin:   Normal overall no scars, lesions, ulcers or rashes. No psoriasis.  Psychiatric: Alert and oriented x 3.  Recent memory intact, remote memory unclear.  Normal mood and affect. Well groomed.  Good eye contact.  Cardiovascular: overall no swelling, no varicosities, no edema bilaterally, normal temperatures of the legs and arms, no clubbing, cyanosis and good capillary refill.  Lymphatic: palpation is normal.  Right ankle has lateral swelling and tenderness of the anterior talofibular ligament on the right. ROM is good.  She has limp to the right.  NV intact.  All other systems reviewed and are negative   The patient has been educated about the nature of the problem(s) and counseled on treatment options.  The patient appeared to understand what I have discussed and is in agreement with it.  Encounter Diagnosis  Name Primary?  . Acute right ankle pain Yes   X-rays were done of the right ankle, reported separately. PLAN Call if any problems.  Precautions discussed.  Continue current medications.   Return to clinic 2 weeks   Stay out of work.  Electronically Signed Darreld Mclean, MD 1/9/202010:29 AM

## 2018-07-13 NOTE — Patient Instructions (Signed)
Out of work 

## 2018-07-26 ENCOUNTER — Ambulatory Visit: Payer: BLUE CROSS/BLUE SHIELD | Admitting: Orthopaedic Surgery

## 2018-07-26 ENCOUNTER — Encounter: Payer: Self-pay | Admitting: Orthopaedic Surgery

## 2018-07-26 VITALS — BP 143/96 | HR 96 | Ht 67.5 in | Wt 148.0 lb

## 2018-07-26 DIAGNOSIS — M25571 Pain in right ankle and joints of right foot: Secondary | ICD-10-CM | POA: Diagnosis not present

## 2018-07-26 DIAGNOSIS — G8929 Other chronic pain: Secondary | ICD-10-CM

## 2018-07-26 DIAGNOSIS — M545 Low back pain: Secondary | ICD-10-CM | POA: Diagnosis not present

## 2018-07-26 DIAGNOSIS — M25561 Pain in right knee: Secondary | ICD-10-CM

## 2018-07-26 NOTE — Progress Notes (Signed)
[name], female DOB:07/07/1960, 58 y.o. WJX:[id number]  Chief Complaint  Patient presents with  . Ankle Pain    HPI  Sandra Davies is a 58 y.o. female who has persistent right ankle pain. It is a little better and has less swelling but the cold weather makes it hurt more.  She has to stand on her feet at work.  She has no new trauma, no redness.  I will keep her out of work another two weeks.   Body mass index is 22.84 kg/m.  ROS  Review of Systems  Constitutional: Positive for activity change.       Patient does not have Diabetes Mellitus. Patient does not have hypertension. Patient does not have COPD or shortness of breath. Patient does not have BMI > 35. Patient does not have current smoking history.   HENT: Negative for congestion.   Respiratory: Negative for cough and shortness of breath.   Cardiovascular: Negative for chest pain.  Gastrointestinal: Positive for constipation.       GERD  Endocrine: Positive for cold intolerance.  Musculoskeletal: Positive for back pain and joint swelling (right knee).  All other systems reviewed and are negative.   All other systems reviewed and are negative.  The following is a summary of the past history medically, past history surgically, known current medicines, social history and family history.  This information is gathered electronically by the computer from prior information and documentation.  I review this each visit and have found including this information at this point in the chart is beneficial and informative.    Past Medical History:  Diagnosis Date  . Arthritis   . Constipation - functional    FOR A LONG TIME  . Dysphagia 2015  . GERD (gastroesophageal reflux disease)     Past Surgical History:  Procedure Laterality Date  . ESOPHAGEAL DILATION N/A 09/03/2014   Procedure: ESOPHAGEAL DILATION;  Surgeon: West BaliSandi L Fields, MD;  Location: [facility];  Service: Endoscopy;  Laterality: N/A;  .  ESOPHAGOGASTRODUODENOSCOPY N/A 09/03/2014   OZH:[id number]:moderate gastritis/dysphagia due to distal esophagel stricture  . ESOPHAGOGASTRODUODENOSCOPY N/A 09/26/2014   NGE:XBMWSLF:mild non-erosive gastritis/stricture at the gastroesphageal  . KNEE SURGERY      Family History  Problem Relation Age of Onset  . Breast cancer Mother        DECEASED  . Alzheimer's disease Father   . Heart disease Brother   . Lupus Sister   . Colon polyps Neg Hx   . Colon cancer Neg Hx   . Stomach cancer Neg Hx     Social History Social History   Tobacco Use  . Smoking status: Never Smoker  . Smokeless tobacco: Never Used  Substance Use Topics  . Alcohol use: Yes    Alcohol/week: 0.0 standard drinks    Comment: very occasional  . Drug use: No    No Known Allergies  Current Outpatient Medications  Medication Sig Dispense Refill  . cyclobenzaprine (FLEXERIL) 10 MG tablet TAKE 1 TABLET BY MOUTH EVERYDAY AT BEDTIME 30 tablet 3  . diclofenac (VOLTAREN) 75 MG EC tablet TAKE 1 TABLET BY MOUTH TWICE A DAY WITH FOOD 180 tablet 1  . diphenhydrAMINE (BENADRYL) 25 MG tablet Take 25 mg by mouth every 6 (six) hours as needed for allergies. Reported on 08/21/2015    . HYDROcodone-acetaminophen (NORCO) 7.5-325 MG tablet One tablet every six hours as needed for pain.  30 day limit. 100 tablet 0  . omeprazole (PRILOSEC) 20 MG  capsule TAKE 1 CAPSULE BY MOUTH 30 MINUTES PRIOR TO BREAKFAST AND SUPPER 60 capsule 11  . pseudoephedrine-acetaminophen (TYLENOL SINUS) 30-500 MG TABS tablet Take 1 tablet by mouth every 4 (four) hours as needed.     No current facility-administered medications for this visit.      Physical Exam  Blood pressure (!) 143/96, pulse 96, height 5' 7.5" (1.715 m), weight 148 lb (67.1 kg), last menstrual period 07/02/2013.  Constitutional: overall normal hygiene, normal nutrition, well developed, normal grooming, normal body habitus. Assistive device:none  Musculoskeletal: gait and station Limp right, muscle  tone and strength are normal, no tremors or atrophy is present.  .  Neurological: coordination overall normal.  Deep tendon reflex/nerve stretch intact.  Sensation normal.  Cranial nerves II-XII intact.   Skin:   Normal overall no scars, lesions, ulcers or rashes. No psoriasis.  Psychiatric: Alert and oriented x 3.  Recent memory intact, remote memory unclear.  Normal mood and affect. Well groomed.  Good eye contact.  Cardiovascular: overall no swelling, no varicosities, no edema bilaterally, normal temperatures of the legs and arms, no clubbing, cyanosis and good capillary refill.  Lymphatic: palpation is normal.  Right ankle has some tenderness, less swelling, more pain laterally over the anterior talofibular ligament.  NV intact.  Limp to the right.  All other systems reviewed and are negative   The patient has been educated about the nature of the problem(s) and counseled on treatment options.  The patient appeared to understand what I have discussed and is in agreement with it.  Encounter Diagnoses  Name Primary?  . Acute right ankle pain Yes  . Chronic pain of right knee   . Chronic midline low back pain without sciatica     PLAN Call if any problems.  Precautions discussed.  Continue current medications.   Return to clinic 2 weeks   Electronically Signed Darreld McleanWayne Everet Flagg, MD 1/22/202010:24 AM

## 2018-07-26 NOTE — Patient Instructions (Signed)
Out of work 

## 2018-08-01 ENCOUNTER — Other Ambulatory Visit: Payer: Self-pay

## 2018-08-03 MED ORDER — HYDROCODONE-ACETAMINOPHEN 7.5-325 MG PO TABS: ORAL_TABLET | ORAL | 0 refills | Status: DC

## 2018-08-09 ENCOUNTER — Encounter: Payer: Self-pay | Admitting: Orthopaedic Surgery

## 2018-08-09 ENCOUNTER — Ambulatory Visit: Payer: BLUE CROSS/BLUE SHIELD | Admitting: Orthopaedic Surgery

## 2018-08-09 VITALS — BP 146/90 | HR 97 | Ht 67.5 in | Wt 151.0 lb

## 2018-08-09 DIAGNOSIS — G8929 Other chronic pain: Secondary | ICD-10-CM

## 2018-08-09 DIAGNOSIS — M545 Low back pain: Secondary | ICD-10-CM

## 2018-08-09 MED ORDER — PREDNISONE 5 MG (21) PO TBPK: ORAL_TABLET | ORAL | 0 refills | Status: DC

## 2018-08-09 NOTE — Progress Notes (Signed)
Patient FX:[id number] Sandra Davies, female DOB:[birth year], 58 y.o. [id number]  Chief Complaint  Patient presents with  . Back Pain  . Knee Pain  . Ankle Pain    HPI  Sandra Davies is a 58 y.o. female who has had increased pain in the right knee and the lower back.  She has pain localized in the back with no weakness.  She has no new trauma.  Her right knee has swelling and popping.  She has no redness.   Body mass index is 23.3 kg/m.  ROS  Review of Systems  Constitutional: Positive for activity change.       Patient does not have Diabetes Mellitus. Patient does not have hypertension. Patient does not have COPD or shortness of breath. Patient does not have BMI > 35. Patient does not have current smoking history.   HENT: Negative for congestion.   Respiratory: Negative for cough and shortness of breath.   Cardiovascular: Negative for chest pain.  Gastrointestinal: Positive for constipation.       GERD  Endocrine: Positive for cold intolerance.  Musculoskeletal: Positive for back pain and joint swelling (right knee).  All other systems reviewed and are negative.   All other systems reviewed and are negative.  The following is a summary of the past history medically, past history surgically, known current medicines, social history and family history.  This information is gathered electronically by the computer from prior information and documentation.  I review this each visit and have found including this information at this point in the chart is beneficial and informative.    Past Medical History:  Diagnosis Date  . Arthritis   . Constipation - functional    FOR A LONG TIME  . Dysphagia 2015  . GERD (gastroesophageal reflux disease)     Past Surgical History:  Procedure Laterality Date  . ESOPHAGEAL DILATION N/A 09/03/2014   Procedure: ESOPHAGEAL DILATION;  Surgeon: West Bali, MD;  Location: [facility];  Service: Endoscopy;  Laterality: N/A;  .  ESOPHAGOGASTRODUODENOSCOPY N/A 09/03/2014   QQI:[id number] gastritis/dysphagia due to distal esophagel stricture  . ESOPHAGOGASTRODUODENOSCOPY N/A 09/26/2014   JHE:RDEY non-erosive gastritis/stricture at the gastroesphageal  . KNEE SURGERY      Family History  Problem Relation Age of Onset  . Breast cancer Mother        DECEASED  . Alzheimer's disease Father   . Heart disease Brother   . Lupus Sister   . Colon polyps Neg Hx   . Colon cancer Neg Hx   . Stomach cancer Neg Hx     Social History Social History   Tobacco Use  . Smoking status: Never Smoker  . Smokeless tobacco: Never Used  Substance Use Topics  . Alcohol use: Yes    Alcohol/week: 0.0 standard drinks    Comment: very occasional  . Drug use: No    No Known Allergies  Current Outpatient Medications  Medication Sig Dispense Refill  . cyclobenzaprine (FLEXERIL) 10 MG tablet TAKE 1 TABLET BY MOUTH EVERYDAY AT BEDTIME 30 tablet 3  . diclofenac (VOLTAREN) 75 MG EC tablet TAKE 1 TABLET BY MOUTH TWICE A DAY WITH FOOD 180 tablet 1  . diphenhydrAMINE (BENADRYL) 25 MG tablet Take 25 mg by mouth every 6 (six) hours as needed for allergies. Reported on 08/21/2015    . HYDROcodone-acetaminophen (NORCO) 7.5-325 MG tablet One tablet every six hours as needed for pain.  30 day limit. 100 tablet 0  . omeprazole (PRILOSEC) 20 MG capsule  TAKE 1 CAPSULE BY MOUTH 30 MINUTES PRIOR TO BREAKFAST AND SUPPER 60 capsule 11  . predniSONE (STERAPRED UNI-PAK 21 TAB) 5 MG (21) TBPK tablet Take 6 pills first day; 5 pills second day; 4 pills third day; 3 pills fourth day; 2 pills next day and 1 pill last day. 21 tablet 0  . pseudoephedrine-acetaminophen (TYLENOL SINUS) 30-500 MG TABS tablet Take 1 tablet by mouth every 4 (four) hours as needed.     No current facility-administered medications for this visit.      Physical Exam  Blood pressure (!) 146/90, pulse 97, height 5' 7.5" (1.715 m), weight 151 lb (68.5 kg), last menstrual period  07/02/2013.  Constitutional: overall normal hygiene, normal nutrition, well developed, normal grooming, normal body habitus. Assistive device:none  Musculoskeletal: gait and station Limp right, muscle tone and strength are normal, no tremors or atrophy is present.  .  Neurological: coordination overall normal.  Deep tendon reflex/nerve stretch intact.  Sensation normal.  Cranial nerves II-XII intact.   Skin:   Normal overall no scars, lesions, ulcers or rashes. No psoriasis.  Psychiatric: Alert and oriented x 3.  Recent memory intact, remote memory unclear.  Normal mood and affect. Well groomed.  Good eye contact.  Cardiovascular: overall no swelling, no varicosities, no edema bilaterally, normal temperatures of the legs and arms, no clubbing, cyanosis and good capillary refill.  Lymphatic: palpation is normal.  Right knee has crepitus, ROM 0 to 110, limp right, stable, effusion.  NV intact.  Spine/Pelvis examination:  Inspection:  Overall, sacoiliac joint benign and hips nontender; without crepitus or defects.   Thoracic spine inspection: Alignment normal without kyphosis present   Lumbar spine inspection:  Alignment  with normal lumbar lordosis, without scoliosis apparent.   Thoracic spine palpation:  without tenderness of spinal processes   Lumbar spine palpation: without tenderness of lumbar area; without tightness of lumbar muscles    Range of Motion:   Lumbar flexion, forward flexion is normal without pain or tenderness    Lumbar extension is full without pain or tenderness   Left lateral bend is normal without pain or tenderness   Right lateral bend is normal without pain or tenderness   Straight leg raising is normal  Strength & tone: normal   Stability overall normal stability  All other systems reviewed and are negative   The patient has been educated about the nature of the problem(s) and counseled on treatment options.  The patient appeared to understand what I  have discussed and is in agreement with it.  Encounter Diagnosis  Name Primary?  . Chronic midline low back pain without sciatica Yes    PLAN Call if any problems.  Precautions discussed.  Continue current medications. I will call in prednisone dose pack.   Return to clinic 2 weeks   Electronically Signed Darreld Mclean, MD 2/5/20201:37 PM

## 2018-08-23 ENCOUNTER — Encounter: Payer: Self-pay | Admitting: Orthopaedic Surgery

## 2018-08-23 ENCOUNTER — Ambulatory Visit: Payer: BLUE CROSS/BLUE SHIELD | Admitting: Orthopaedic Surgery

## 2018-08-23 VITALS — BP 130/85 | HR 82 | Ht 67.5 in | Wt 156.0 lb

## 2018-08-23 DIAGNOSIS — M25561 Pain in right knee: Secondary | ICD-10-CM

## 2018-08-23 DIAGNOSIS — M545 Low back pain, unspecified: Secondary | ICD-10-CM

## 2018-08-23 DIAGNOSIS — G8929 Other chronic pain: Secondary | ICD-10-CM | POA: Diagnosis not present

## 2018-08-23 MED ORDER — CYCLOBENZAPRINE HCL 10 MG PO TABS: ORAL_TABLET | ORAL | 3 refills | Status: DC

## 2018-08-23 NOTE — Progress Notes (Signed)
Patient Sandra Davies, female DOB:[birth year], 59 y.o. HTX:[id number]  Chief Complaint  Patient presents with  . Back Pain    low back and Rt knee pain    HPI  Sandra Davies is a 58 y.o. female who has chronic lower back pain that has been made worse with the colder weather.  She has no numbness.  Her right knee is tender and has swelling.  She has no giving way and no trauma.  Her right ankle pain is much improved.  She is taking her medicine.  She is doing her exercises.   Body mass index is 24.07 kg/m.  ROS  Review of Systems  Constitutional: Positive for activity change.       Patient does not have Diabetes Mellitus. Patient does not have hypertension. Patient does not have COPD or shortness of breath. Patient does not have BMI > 35. Patient does not have current smoking history.   HENT: Negative for congestion.   Respiratory: Negative for cough and shortness of breath.   Cardiovascular: Negative for chest pain.  Gastrointestinal: Positive for constipation.       GERD  Endocrine: Positive for cold intolerance.  Musculoskeletal: Positive for back pain and joint swelling (right knee).  All other systems reviewed and are negative.   All other systems reviewed and are negative.  The following is a summary of the past history medically, past history surgically, known current medicines, social history and family history.  This information is gathered electronically by the computer from prior information and documentation.  I review this each visit and have found including this information at this point in the chart is beneficial and informative.    Past Medical History:  Diagnosis Date  . Arthritis   . Constipation - functional    FOR A LONG TIME  . Dysphagia 2015  . GERD (gastroesophageal reflux disease)     Past Surgical History:  Procedure Laterality Date  . ESOPHAGEAL DILATION N/A 09/03/2014   Procedure: ESOPHAGEAL DILATION;  Surgeon: West Bali, MD;   Location: [facility];  Service: Endoscopy;  Laterality: N/A;  . ESOPHAGOGASTRODUODENOSCOPY N/A 09/03/2014   VUY:EBXIDHWY gastritis/dysphagia due to distal esophagel stricture  . ESOPHAGOGASTRODUODENOSCOPY N/A 09/26/2014   SHU:OHFG non-erosive gastritis/stricture at the gastroesphageal  . KNEE SURGERY      Family History  Problem Relation Age of Onset  . Breast cancer Mother        DECEASED  . Alzheimer's disease Father   . Heart disease Brother   . Lupus Sister   . Colon polyps Neg Hx   . Colon cancer Neg Hx   . Stomach cancer Neg Hx     Social History Social History   Tobacco Use  . Smoking status: Never Smoker  . Smokeless tobacco: Never Used  Substance Use Topics  . Alcohol use: Yes    Alcohol/week: 0.0 standard drinks    Comment: very occasional  . Drug use: No    No Known Allergies  Current Outpatient Medications  Medication Sig Dispense Refill  . cyclobenzaprine (FLEXERIL) 10 MG tablet TAKE 1 TABLET BY MOUTH EVERYDAY AT BEDTIME 30 tablet 3  . diclofenac (VOLTAREN) 75 MG EC tablet TAKE 1 TABLET BY MOUTH TWICE A DAY WITH FOOD 180 tablet 1  . diphenhydrAMINE (BENADRYL) 25 MG tablet Take 25 mg by mouth every 6 (six) hours as needed for allergies. Reported on 08/21/2015    . HYDROcodone-acetaminophen (NORCO) 7.5-325 MG tablet One tablet every six hours as needed  for pain.  30 day limit. 100 tablet 0  . omeprazole (PRILOSEC) 20 MG capsule TAKE 1 CAPSULE BY MOUTH 30 MINUTES PRIOR TO BREAKFAST AND SUPPER 60 capsule 11  . predniSONE (STERAPRED UNI-PAK 21 TAB) 5 MG (21) TBPK tablet Take 6 pills first day; 5 pills second day; 4 pills third day; 3 pills fourth day; 2 pills next day and 1 pill last day. 21 tablet 0  . pseudoephedrine-acetaminophen (TYLENOL SINUS) 30-500 MG TABS tablet Take 1 tablet by mouth every 4 (four) hours as needed.     No current facility-administered medications for this visit.      Physical Exam  Blood pressure 130/85, pulse 82, height 5' 7.5"  (1.715 m), weight 156 lb (70.8 kg), last menstrual period 07/02/2013.  Constitutional: overall normal hygiene, normal nutrition, well developed, normal grooming, normal body habitus. Assistive device:none  Musculoskeletal: gait and station Limp right, muscle tone and strength are normal, no tremors or atrophy is present.  .  Neurological: coordination overall normal.  Deep tendon reflex/nerve stretch intact.  Sensation normal.  Cranial nerves II-XII intact.   Skin:   Normal overall no scars, lesions, ulcers or rashes. No psoriasis.  Psychiatric: Alert and oriented x 3.  Recent memory intact, remote memory unclear.  Normal mood and affect. Well groomed.  Good eye contact.  Cardiovascular: overall no swelling, no varicosities, no edema bilaterally, normal temperatures of the legs and arms, no clubbing, cyanosis and good capillary refill.  Lymphatic: palpation is normal.  Spine/Pelvis examination:  Inspection:  Overall, sacoiliac joint benign and hips nontender; without crepitus or defects.   Thoracic spine inspection: Alignment normal without kyphosis present   Lumbar spine inspection:  Alignment  with normal lumbar lordosis, without scoliosis apparent.   Thoracic spine palpation:  without tenderness of spinal processes   Lumbar spine palpation: without tenderness of lumbar area; without tightness of lumbar muscles    Range of Motion:   Lumbar flexion, forward flexion is normal without pain or tenderness    Lumbar extension is full without pain or tenderness   Left lateral bend is normal without pain or tenderness   Right lateral bend is normal without pain or tenderness   Straight leg raising is normal  Strength & tone: normal   Stability overall normal stability  All other systems reviewed and are negative   The patient has been educated about the nature of the problem(s) and counseled on treatment options.  The patient appeared to understand what I have discussed and is in  agreement with it.  Encounter Diagnoses  Name Primary?  . Chronic midline low back pain without sciatica Yes  . Chronic pain of right knee     PLAN Call if any problems.  Precautions discussed.  Continue current medications.   Return to clinic 1 month   Return to work September 11, 2018  Electronically Signed Darreld Mclean, MD 2/19/20201:49 PM

## 2018-08-23 NOTE — Patient Instructions (Signed)
Return to work September 11, 2018

## 2018-08-29 ENCOUNTER — Other Ambulatory Visit: Payer: Self-pay

## 2018-08-30 MED ORDER — HYDROCODONE-ACETAMINOPHEN 7.5-325 MG PO TABS: ORAL_TABLET | ORAL | 0 refills | Status: DC

## 2018-09-19 ENCOUNTER — Other Ambulatory Visit: Payer: Self-pay

## 2018-09-19 ENCOUNTER — Ambulatory Visit: Payer: BLUE CROSS/BLUE SHIELD | Admitting: Orthopaedic Surgery

## 2018-09-19 ENCOUNTER — Encounter: Payer: Self-pay | Admitting: Orthopaedic Surgery

## 2018-09-19 VITALS — BP 150/97 | HR 83 | Ht 67.5 in | Wt 151.0 lb

## 2018-09-19 DIAGNOSIS — M25561 Pain in right knee: Secondary | ICD-10-CM

## 2018-09-19 DIAGNOSIS — G8929 Other chronic pain: Secondary | ICD-10-CM | POA: Diagnosis not present

## 2018-09-19 DIAGNOSIS — M545 Low back pain: Secondary | ICD-10-CM | POA: Diagnosis not present

## 2018-09-19 NOTE — Progress Notes (Signed)
Patient Sandra Davies, female DOB:[birth year], 58 y.o. HGD:[id number]  No chief complaint on file.   HPI  Sandra Davies is a 58 y.o. female who has chronic lower back pain and right knee pain. She is better. She has returned to work without restrictions. She has no new trauma. She has no paresthesias.  She is doing her exercises and taking her medicine.   Body mass index is 23.3 kg/m.  ROS  Review of Systems  Constitutional: Positive for activity change.       Patient does not have Diabetes Mellitus. Patient does not have hypertension. Patient does not have COPD or shortness of breath. Patient does not have BMI > 35. Patient does not have current smoking history.   HENT: Negative for congestion.   Respiratory: Negative for cough and shortness of breath.   Cardiovascular: Negative for chest pain.  Gastrointestinal: Positive for constipation.       GERD  Endocrine: Positive for cold intolerance.  Musculoskeletal: Positive for back pain and joint swelling (right knee).  All other systems reviewed and are negative.   All other systems reviewed and are negative.  The following is a summary of the past history medically, past history surgically, known current medicines, social history and family history.  This information is gathered electronically by the computer from prior information and documentation.  I review this each visit and have found including this information at this point in the chart is beneficial and informative.    Past Medical History:  Diagnosis Date  . Arthritis   . Constipation - functional    FOR A LONG TIME  . Dysphagia 2015  . GERD (gastroesophageal reflux disease)     Past Surgical History:  Procedure Laterality Date  . ESOPHAGEAL DILATION N/A 09/03/2014   Procedure: ESOPHAGEAL DILATION;  Surgeon: West Bali, MD;  Location: [facility];  Service: Endoscopy;  Laterality: N/A;  . ESOPHAGOGASTRODUODENOSCOPY N/A 09/03/2014   DQQ:IWLNLGXQ  gastritis/dysphagia due to distal esophagel stricture  . ESOPHAGOGASTRODUODENOSCOPY N/A 09/26/2014   JJH:ERDE non-erosive gastritis/stricture at the gastroesphageal  . KNEE SURGERY      Family History  Problem Relation Age of Onset  . Breast cancer Mother        DECEASED  . Alzheimer's disease Father   . Heart disease Brother   . Lupus Sister   . Colon polyps Neg Hx   . Colon cancer Neg Hx   . Stomach cancer Neg Hx     Social History Social History   Tobacco Use  . Smoking status: Never Smoker  . Smokeless tobacco: Never Used  Substance Use Topics  . Alcohol use: Yes    Alcohol/week: 0.0 standard drinks    Comment: very occasional  . Drug use: No    No Known Allergies  Current Outpatient Medications  Medication Sig Dispense Refill  . cyclobenzaprine (FLEXERIL) 10 MG tablet TAKE 1 TABLET BY MOUTH EVERYDAY AT BEDTIME 30 tablet 3  . diclofenac (VOLTAREN) 75 MG EC tablet TAKE 1 TABLET BY MOUTH TWICE A DAY WITH FOOD 180 tablet 1  . diphenhydrAMINE (BENADRYL) 25 MG tablet Take 25 mg by mouth every 6 (six) hours as needed for allergies. Reported on 08/21/2015    . HYDROcodone-acetaminophen (NORCO) 7.5-325 MG tablet One tablet every six hours as needed for pain.  30 day limit. 100 tablet 0  . omeprazole (PRILOSEC) 20 MG capsule TAKE 1 CAPSULE BY MOUTH 30 MINUTES PRIOR TO BREAKFAST AND SUPPER 60 capsule 11  . predniSONE (  STERAPRED UNI-PAK 21 TAB) 5 MG (21) TBPK tablet Take 6 pills first day; 5 pills second day; 4 pills third day; 3 pills fourth day; 2 pills next day and 1 pill last day. 21 tablet 0  . pseudoephedrine-acetaminophen (TYLENOL SINUS) 30-500 MG TABS tablet Take 1 tablet by mouth every 4 (four) hours as needed.     No current facility-administered medications for this visit.      Physical Exam  Blood pressure (!) 150/97, pulse 83, height 5' 7.5" (1.715 m), weight 151 lb (68.5 kg), last menstrual period 07/02/2013.  Constitutional: overall normal hygiene, normal  nutrition, well developed, normal grooming, normal body habitus. Assistive device:none  Musculoskeletal: gait and station Limp none, muscle tone and strength are normal, no tremors or atrophy is present.  .  Neurological: coordination overall normal.  Deep tendon reflex/nerve stretch intact.  Sensation normal.  Cranial nerves II-XII intact.   Skin:   Normal overall no scars, lesions, ulcers or rashes. No psoriasis.  Psychiatric: Alert and oriented x 3.  Recent memory intact, remote memory unclear.  Normal mood and affect. Well groomed.  Good eye contact.  Cardiovascular: overall no swelling, no varicosities, no edema bilaterally, normal temperatures of the legs and arms, no clubbing, cyanosis and good capillary refill.  Lymphatic: palpation is normal.  Spine/Pelvis examination:  Inspection:  Overall, sacoiliac joint benign and hips nontender; without crepitus or defects.   Thoracic spine inspection: Alignment normal without kyphosis present   Lumbar spine inspection:  Alignment  with normal lumbar lordosis, without scoliosis apparent.   Thoracic spine palpation:  without tenderness of spinal processes   Lumbar spine palpation: without tenderness of lumbar area; without tightness of lumbar muscles    Range of Motion:   Lumbar flexion, forward flexion is normal without pain or tenderness    Lumbar extension is full without pain or tenderness   Left lateral bend is normal without pain or tenderness   Right lateral bend is normal without pain or tenderness   Straight leg raising is normal  Strength & tone: normal   Stability overall normal stability  Right knee with some crepitus, ROM 0 to 115, no limp, NV intact, knee is stable.  No effusion today.  All other systems reviewed and are negative   The patient has been educated about the nature of the problem(s) and counseled on treatment options.  The patient appeared to understand what I have discussed and is in agreement with  it.  Encounter Diagnoses  Name Primary?  . Chronic midline low back pain without sciatica Yes  . Chronic pain of right knee     PLAN Call if any problems.  Precautions discussed.  Continue current medications.   Return to clinic 1 month   Forms for work completed  Electronically Signed Darreld Mclean, MD 3/17/20201:58 PM

## 2018-09-29 ENCOUNTER — Other Ambulatory Visit: Payer: Self-pay

## 2018-09-29 NOTE — Telephone Encounter (Signed)
X °

## 2018-10-02 MED ORDER — HYDROCODONE-ACETAMINOPHEN 7.5-325 MG PO TABS: ORAL_TABLET | ORAL | 0 refills | Status: DC

## 2018-10-17 ENCOUNTER — Ambulatory Visit (INDEPENDENT_AMBULATORY_CARE_PROVIDER_SITE_OTHER): Payer: BLUE CROSS/BLUE SHIELD | Admitting: Orthopaedic Surgery

## 2018-10-17 ENCOUNTER — Other Ambulatory Visit: Payer: Self-pay

## 2018-10-17 ENCOUNTER — Encounter: Payer: Self-pay | Admitting: Orthopaedic Surgery

## 2018-10-17 DIAGNOSIS — M545 Low back pain, unspecified: Secondary | ICD-10-CM

## 2018-10-17 DIAGNOSIS — G8929 Other chronic pain: Secondary | ICD-10-CM

## 2018-10-17 MED ORDER — CYCLOBENZAPRINE HCL 10 MG PO TABS: ORAL_TABLET | ORAL | 3 refills | Status: DC

## 2018-10-17 NOTE — Progress Notes (Signed)
Virtual Visit via Telephone Note  I connected with Sandra Davies on 10/17/18 at 10:00 AM EDT by telephone and verified that I am speaking with the correct person using two identifiers.   I discussed the limitations, risks, security and privacy concerns of performing an evaluation and management service by telephone and the availability of in person appointments. I also discussed with the patient that there may be a patient responsible charge related to this service. The patient expressed understanding and agreed to proceed.   History of Present Illness: She has chronic lower back pain and right knee pain. She has no new trauma. She has had increased pain with the change in weather. She has no weakness.  She is doing her exercises and taking her medicine.   Observations/Objective: Per above  Assessment and Plan: Encounter Diagnosis  Name Primary?  . Chronic midline low back pain without sciatica Yes     Follow Up Instructions: Six weeks by phone.  I have called in new Rx for Flexeril.   I discussed the assessment and treatment plan with the patient. The patient was provided an opportunity to ask questions and all were answered. The patient agreed with the plan and demonstrated an understanding of the instructions.   The patient was advised to call back or seek an in-person evaluation if the symptoms worsen or if the condition fails to improve as anticipated.  I provided 6 minutes of non-face-to-face time during this encounter.   Darreld Mclean, MD

## 2018-10-26 ENCOUNTER — Other Ambulatory Visit: Payer: Self-pay

## 2018-10-26 MED ORDER — HYDROCODONE-ACETAMINOPHEN 7.5-325 MG PO TABS: ORAL_TABLET | ORAL | 0 refills | Status: DC

## 2018-11-19 ENCOUNTER — Other Ambulatory Visit: Payer: Self-pay | Admitting: Orthopedic Surgery

## 2018-11-23 ENCOUNTER — Other Ambulatory Visit: Payer: Self-pay

## 2018-11-25 MED ORDER — HYDROCODONE-ACETAMINOPHEN 7.5-325 MG PO TABS: ORAL_TABLET | ORAL | 0 refills | Status: DC

## 2018-11-28 ENCOUNTER — Ambulatory Visit: Payer: Self-pay | Admitting: Orthopaedic Surgery

## 2018-11-29 ENCOUNTER — Encounter: Payer: Self-pay | Admitting: Orthopaedic Surgery

## 2018-11-29 ENCOUNTER — Other Ambulatory Visit: Payer: Self-pay

## 2018-11-29 ENCOUNTER — Ambulatory Visit (INDEPENDENT_AMBULATORY_CARE_PROVIDER_SITE_OTHER): Payer: BLUE CROSS/BLUE SHIELD | Admitting: Orthopaedic Surgery

## 2018-11-29 DIAGNOSIS — M545 Low back pain: Secondary | ICD-10-CM | POA: Diagnosis not present

## 2018-11-29 DIAGNOSIS — M25561 Pain in right knee: Secondary | ICD-10-CM

## 2018-11-29 DIAGNOSIS — G8929 Other chronic pain: Secondary | ICD-10-CM

## 2018-11-29 MED ORDER — DICLOFENAC SODIUM 75 MG PO TBEC: 75.0000 mg | DELAYED_RELEASE_TABLET | Freq: Two times a day (BID) | ORAL | 1 refills | Status: DC

## 2018-11-29 MED ORDER — CYCLOBENZAPRINE HCL 10 MG PO TABS: ORAL_TABLET | ORAL | 3 refills | Status: DC

## 2018-11-29 NOTE — Progress Notes (Signed)
Virtual Visit via Telephone Note  I connected with@ on 11/29/18 at 10:10 AM EDT by telephone and verified that I am speaking with the correct person using two identifiers.  Location: Patient: home Provider: home   I discussed the limitations, risks, security and privacy concerns of performing an evaluation and management service by telephone and the availability of in person appointments. I also discussed with the patient that there may be a patient responsible charge related to this service. The patient expressed understanding and agreed to proceed.   History of Present Illness: She returned to work yesterday after a period of closure for COVID-19. She did well.  She has some swelling of the right knee and some lower back pain but she is taking her medicine.  She has no new trauma, no giving way of the knee.     Observations/Objective: Per above.  Assessment and Plan: Encounter Diagnoses  Name Primary?  . Chronic midline low back pain without sciatica Yes  . Chronic pain of right knee      Follow Up Instructions: 3 months.  Call if any problem.  Precautions discussed.      I discussed the assessment and treatment plan with the patient. The patient was provided an opportunity to ask questions and all were answered. The patient agreed with the plan and demonstrated an understanding of the instructions.   The patient was advised to call back or seek an in-person evaluation if the symptoms worsen or if the condition fails to improve as anticipated.  I provided 7 minutes of non-face-to-face time during this encounter.   Darreld Mclean, MD

## 2018-12-28 ENCOUNTER — Other Ambulatory Visit: Payer: Self-pay

## 2019-01-01 MED ORDER — HYDROCODONE-ACETAMINOPHEN 7.5-325 MG PO TABS: ORAL_TABLET | ORAL | 0 refills | Status: DC

## 2019-02-01 ENCOUNTER — Other Ambulatory Visit: Payer: Self-pay

## 2019-02-01 MED ORDER — HYDROCODONE-ACETAMINOPHEN 7.5-325 MG PO TABS: ORAL_TABLET | ORAL | 0 refills | Status: DC

## 2019-02-28 ENCOUNTER — Ambulatory Visit: Payer: BLUE CROSS/BLUE SHIELD | Admitting: Orthopaedic Surgery

## 2019-03-01 ENCOUNTER — Other Ambulatory Visit: Payer: Self-pay

## 2019-03-05 MED ORDER — HYDROCODONE-ACETAMINOPHEN 7.5-325 MG PO TABS: ORAL_TABLET | ORAL | 0 refills | Status: DC

## 2019-03-07 ENCOUNTER — Encounter: Payer: Self-pay | Admitting: Orthopaedic Surgery

## 2019-03-07 ENCOUNTER — Ambulatory Visit (INDEPENDENT_AMBULATORY_CARE_PROVIDER_SITE_OTHER): Payer: BC Managed Care – PPO | Admitting: Orthopaedic Surgery

## 2019-03-07 ENCOUNTER — Other Ambulatory Visit: Payer: Self-pay

## 2019-03-07 DIAGNOSIS — M545 Low back pain, unspecified: Secondary | ICD-10-CM

## 2019-03-07 DIAGNOSIS — G8929 Other chronic pain: Secondary | ICD-10-CM | POA: Diagnosis not present

## 2019-03-07 DIAGNOSIS — M25561 Pain in right knee: Secondary | ICD-10-CM

## 2019-03-07 NOTE — Progress Notes (Signed)
Virtual Visit via Telephone Note  I connected with@ on 03/07/19 at 10:50 AM EDT by telephone and verified that I am speaking with the correct person using two identifiers.  Location: Patient: home Provider: [facility]   I discussed the limitations, risks, security and privacy concerns of performing an evaluation and management service by telephone and the availability of in person appointments. I also discussed with the patient that there may be a patient responsible charge related to this service. The patient expressed understanding and agreed to proceed.   History of Present Illness: She has been working 12 hours a day six days a week for the last month or so. She is very tired after getting home.  She has no new trauma.  Her right knee has swelling and pain but no giving way.  She has no redness.  The lower back hurts some more related to activity.  She is taking her medicine.  She prefers virtual visits and I will continue this.   Observations/Objective: Per above.  Assessment and Plan: Encounter Diagnoses  Name Primary?  . Chronic midline low back pain without sciatica Yes  . Chronic pain of right knee      Follow Up Instructions: Three months.  She had a recent refill of her pain medicine.   I discussed the assessment and treatment plan with the patient. The patient was provided an opportunity to ask questions and all were answered. The patient agreed with the plan and demonstrated an understanding of the instructions.   The patient was advised to call back or seek an in-person evaluation if the symptoms worsen or if the condition fails to improve as anticipated.  I provided 7 minutes of non-face-to-face time during this encounter.   Sanjuana Kava, MD

## 2019-04-04 ENCOUNTER — Other Ambulatory Visit: Payer: Self-pay

## 2019-04-04 MED ORDER — HYDROCODONE-ACETAMINOPHEN 7.5-325 MG PO TABS: ORAL_TABLET | ORAL | 0 refills | Status: DC

## 2019-05-01 ENCOUNTER — Other Ambulatory Visit: Payer: Self-pay

## 2019-05-01 MED ORDER — HYDROCODONE-ACETAMINOPHEN 7.5-325 MG PO TABS: ORAL_TABLET | ORAL | 0 refills | Status: DC

## 2019-05-15 ENCOUNTER — Other Ambulatory Visit: Payer: Self-pay | Admitting: Orthopaedic Surgery

## 2019-05-21 NOTE — Telephone Encounter (Signed)
Rx request 

## 2019-05-30 DIAGNOSIS — Z0000 Encounter for general adult medical examination without abnormal findings: Secondary | ICD-10-CM | POA: Diagnosis not present

## 2019-05-30 DIAGNOSIS — R5383 Other fatigue: Secondary | ICD-10-CM | POA: Diagnosis not present

## 2019-06-02 DIAGNOSIS — Z0001 Encounter for general adult medical examination with abnormal findings: Secondary | ICD-10-CM | POA: Diagnosis not present

## 2019-06-02 DIAGNOSIS — R42 Dizziness and giddiness: Secondary | ICD-10-CM | POA: Diagnosis not present

## 2019-06-02 DIAGNOSIS — R55 Syncope and collapse: Secondary | ICD-10-CM | POA: Diagnosis not present

## 2019-06-02 DIAGNOSIS — N959 Unspecified menopausal and perimenopausal disorder: Secondary | ICD-10-CM | POA: Diagnosis not present

## 2019-06-05 ENCOUNTER — Other Ambulatory Visit (HOSPITAL_COMMUNITY): Payer: Self-pay | Admitting: Internal Medicine

## 2019-06-05 ENCOUNTER — Other Ambulatory Visit: Payer: Self-pay

## 2019-06-05 DIAGNOSIS — Z1231 Encounter for screening mammogram for malignant neoplasm of breast: Secondary | ICD-10-CM

## 2019-06-05 MED ORDER — HYDROCODONE-ACETAMINOPHEN 7.5-325 MG PO TABS: ORAL_TABLET | ORAL | 0 refills | Status: DC

## 2019-06-14 ENCOUNTER — Ambulatory Visit (HOSPITAL_COMMUNITY): Payer: BC Managed Care – PPO

## 2019-06-18 ENCOUNTER — Ambulatory Visit (HOSPITAL_COMMUNITY): Payer: BC Managed Care – PPO

## 2019-06-26 ENCOUNTER — Encounter: Payer: Self-pay | Admitting: Orthopaedic Surgery

## 2019-06-26 ENCOUNTER — Ambulatory Visit (INDEPENDENT_AMBULATORY_CARE_PROVIDER_SITE_OTHER): Payer: BC Managed Care – PPO | Admitting: Orthopaedic Surgery

## 2019-06-26 ENCOUNTER — Other Ambulatory Visit: Payer: Self-pay

## 2019-06-26 DIAGNOSIS — G8929 Other chronic pain: Secondary | ICD-10-CM

## 2019-06-26 DIAGNOSIS — M545 Low back pain: Secondary | ICD-10-CM | POA: Diagnosis not present

## 2019-06-26 DIAGNOSIS — M25561 Pain in right knee: Secondary | ICD-10-CM | POA: Diagnosis not present

## 2019-06-26 NOTE — Progress Notes (Signed)
Virtual Visit via Telephone Note  I connected with@ on 06/26/19 at  3:00 PM EST by telephone and verified that I am speaking with the correct person using two identifiers.  Location: Patient: home Provider: [place]   I discussed the limitations, risks, security and privacy concerns of performing an evaluation and management service by telephone and the availability of in person appointments. I also discussed with the patient that there may be a patient responsible charge related to this service. The patient expressed understanding and agreed to proceed.   History of Present Illness: She has chronic lower back and right knee pain.  She is working a lot at work and has more pain at the end of a long shift. She has no numbness, no weakness and her right knee has more pain with the cold weather.  She is taking her medicine which helps.  She has no new trauma.   Observations/Objective: Per above.  Assessment and Plan: Encounter Diagnoses  Name Primary?  . Chronic midline low back pain without sciatica Yes  . Chronic pain of right knee      Follow Up Instructions: Three months.  Call for Rx refills   I discussed the assessment and treatment plan with the patient. The patient was provided an opportunity to ask questions and all were answered. The patient agreed with the plan and demonstrated an understanding of the instructions.   The patient was advised to call back or seek an in-person evaluation if the symptoms worsen or if the condition fails to improve as anticipated.  I provided 8 minutes of non-face-to-face time during this encounter.   Sanjuana Kava, MD

## 2019-07-03 ENCOUNTER — Other Ambulatory Visit: Payer: Self-pay

## 2019-07-03 MED ORDER — HYDROCODONE-ACETAMINOPHEN 7.5-325 MG PO TABS: ORAL_TABLET | ORAL | 0 refills | Status: DC

## 2019-07-11 ENCOUNTER — Ambulatory Visit (HOSPITAL_COMMUNITY): Payer: BC Managed Care – PPO

## 2019-07-31 ENCOUNTER — Other Ambulatory Visit: Payer: Self-pay

## 2019-07-31 MED ORDER — HYDROCODONE-ACETAMINOPHEN 7.5-325 MG PO TABS: ORAL_TABLET | ORAL | 0 refills | Status: DC

## 2019-08-14 DIAGNOSIS — R5383 Other fatigue: Secondary | ICD-10-CM | POA: Diagnosis not present

## 2019-08-14 DIAGNOSIS — Z124 Encounter for screening for malignant neoplasm of cervix: Secondary | ICD-10-CM | POA: Diagnosis not present

## 2019-08-25 DIAGNOSIS — M25559 Pain in unspecified hip: Secondary | ICD-10-CM | POA: Diagnosis not present

## 2019-08-25 DIAGNOSIS — E559 Vitamin D deficiency, unspecified: Secondary | ICD-10-CM | POA: Diagnosis not present

## 2019-08-25 DIAGNOSIS — M545 Low back pain: Secondary | ICD-10-CM | POA: Diagnosis not present

## 2019-08-25 DIAGNOSIS — R5383 Other fatigue: Secondary | ICD-10-CM | POA: Diagnosis not present

## 2019-08-30 ENCOUNTER — Other Ambulatory Visit: Payer: Self-pay | Admitting: Orthopaedic Surgery

## 2019-08-30 MED ORDER — HYDROCODONE-ACETAMINOPHEN 7.5-325 MG PO TABS: ORAL_TABLET | ORAL | 0 refills | Status: DC

## 2019-09-01 ENCOUNTER — Other Ambulatory Visit: Payer: Self-pay | Admitting: Orthopaedic Surgery

## 2019-09-25 ENCOUNTER — Encounter: Payer: Self-pay | Admitting: Orthopaedic Surgery

## 2019-09-25 ENCOUNTER — Ambulatory Visit: Payer: BC Managed Care – PPO | Admitting: Orthopaedic Surgery

## 2019-09-27 ENCOUNTER — Other Ambulatory Visit: Payer: Self-pay | Admitting: Orthopaedic Surgery

## 2019-09-27 MED ORDER — HYDROCODONE-ACETAMINOPHEN 7.5-325 MG PO TABS: ORAL_TABLET | ORAL | 0 refills | Status: DC

## 2019-10-23 ENCOUNTER — Encounter: Payer: Self-pay | Admitting: Orthopaedic Surgery

## 2019-10-23 ENCOUNTER — Ambulatory Visit: Payer: BC Managed Care – PPO | Admitting: Orthopaedic Surgery

## 2019-10-23 ENCOUNTER — Other Ambulatory Visit: Payer: Self-pay

## 2019-10-23 VITALS — BP 131/81 | HR 73 | Ht 67.5 in | Wt 145.0 lb

## 2019-10-23 DIAGNOSIS — M545 Low back pain, unspecified: Secondary | ICD-10-CM

## 2019-10-23 DIAGNOSIS — M25561 Pain in right knee: Secondary | ICD-10-CM

## 2019-10-23 DIAGNOSIS — G8929 Other chronic pain: Secondary | ICD-10-CM | POA: Diagnosis not present

## 2019-10-23 MED ORDER — HYDROCODONE-ACETAMINOPHEN 7.5-325 MG PO TABS: ORAL_TABLET | ORAL | 0 refills | Status: DC

## 2019-10-23 NOTE — Progress Notes (Signed)
Patient Sandra Davies, female DOB:07-Jul-1960, 59 y.o. [id number]  Chief Complaint  Patient presents with  . Back Pain    HPI  Sandra Davies is a 59 y.o. female who has chronic pain of the lower back and right knee.  She has good and bad days.  She is working 12 hour shifts seven days a week.  She is tired.  She has no new trauma. She is taking her medicine.   Body mass index is 22.38 kg/m.  ROS  Review of Systems  Constitutional: Positive for activity change.       Patient does not have Diabetes Mellitus. Patient does not have hypertension. Patient does not have COPD or shortness of breath. Patient does not have BMI > 35. Patient does not have current smoking history.   HENT: Negative for congestion.   Respiratory: Negative for cough and shortness of breath.   Cardiovascular: Negative for chest pain.  Gastrointestinal: Positive for constipation.       GERD  Endocrine: Positive for cold intolerance.  Musculoskeletal: Positive for back pain and joint swelling (right knee).  All other systems reviewed and are negative.   All other systems reviewed and are negative.  The following is a summary of the past history medically, past history surgically, known current medicines, social history and family history.  This information is gathered electronically by the computer from prior information and documentation.  I review this each visit and have found including this information at this point in the chart is beneficial and informative.    Past Medical History:  Diagnosis Date  . Arthritis   . Constipation - functional    FOR A LONG TIME  . Dysphagia 2015  . GERD (gastroesophageal reflux disease)     Past Surgical History:  Procedure Laterality Date  . ESOPHAGEAL DILATION N/A 09/03/2014   Procedure: ESOPHAGEAL DILATION;  Surgeon: West Bali, MD;  [facility];  Service: Endoscopy;  Laterality: N/A;  . ESOPHAGOGASTRODUODENOSCOPY N/A 09/03/2014    DGL:OVFIEPPI gastritis/dysphagia due to distal esophagel stricture  . ESOPHAGOGASTRODUODENOSCOPY N/A 09/26/2014   RJJ:OACZ non-erosive gastritis/stricture at the gastroesphageal  . KNEE SURGERY      Family History  Problem Relation Age of Onset  . Breast cancer Mother        DECEASED  . Alzheimer's disease Father   . Heart disease Brother   . Lupus Sister   . Colon polyps Neg Hx   . Colon cancer Neg Hx   . Stomach cancer Neg Hx     Social History Social History   Tobacco Use  . Smoking status: Never Smoker  . Smokeless tobacco: Never Used  Substance Use Topics  . Alcohol use: Yes    Alcohol/week: 0.0 standard drinks    Comment: very occasional  . Drug use: No    No Known Allergies  Current Outpatient Medications  Medication Sig Dispense Refill  . cyclobenzaprine (FLEXERIL) 10 MG tablet TAKE 1 TABLET BY MOUTH EVERYDAY AT BEDTIME 30 tablet 3  . diclofenac (VOLTAREN) 75 MG EC tablet TAKE 1 TABLET (75 MG TOTAL) BY MOUTH 2 (TWO) TIMES DAILY WITH A MEAL. 180 tablet 1  . diphenhydrAMINE (BENADRYL) 25 MG tablet Take 25 mg by mouth every 6 (six) hours as needed for allergies. Reported on 08/21/2015    . HYDROcodone-acetaminophen (NORCO) 7.5-325 MG tablet One tablet every six hours as needed for pain.  30 day limit. 100 tablet 0  . omeprazole (PRILOSEC) 20 MG capsule TAKE 1  CAPSULE BY MOUTH 30 MINUTES PRIOR TO BREAKFAST AND SUPPER 60 capsule 11  . predniSONE (STERAPRED UNI-PAK 21 TAB) 5 MG (21) TBPK tablet Take 6 pills first day; 5 pills second day; 4 pills third day; 3 pills fourth day; 2 pills next day and 1 pill last day. 21 tablet 0  . pseudoephedrine-acetaminophen (TYLENOL SINUS) 30-500 MG TABS tablet Take 1 tablet by mouth every 4 (four) hours as needed.     No current facility-administered medications for this visit.     Physical Exam  Blood pressure 131/81, pulse 73, height 5' 7.5" (1.715 m), weight 145 lb (65.8 kg), last menstrual period 07/02/2013.  Constitutional:  overall normal hygiene, normal nutrition, well developed, normal grooming, normal body habitus. Assistive device:none  Musculoskeletal: gait and station Limp right, muscle tone and strength are normal, no tremors or atrophy is present.  .  Neurological: coordination overall normal.  Deep tendon reflex/nerve stretch intact.  Sensation normal.  Cranial nerves II-XII intact.   Skin:   Normal overall no scars, lesions, ulcers or rashes. No psoriasis.  Psychiatric: Alert and oriented x 3.  Recent memory intact, remote memory unclear.  Normal mood and affect. Well groomed.  Good eye contact.  Cardiovascular: overall no swelling, no varicosities, no edema bilaterally, normal temperatures of the legs and arms, no clubbing, cyanosis and good capillary refill.  Lymphatic: palpation is normal.  Spine/Pelvis examination:  Inspection:  Overall, sacoiliac joint benign and hips nontender; without crepitus or defects.   Thoracic spine inspection: Alignment normal without kyphosis present   Lumbar spine inspection:  Alignment  with normal lumbar lordosis, without scoliosis apparent.   Thoracic spine palpation:  without tenderness of spinal processes   Lumbar spine palpation: without tenderness of lumbar area; without tightness of lumbar muscles    Range of Motion:   Lumbar flexion, forward flexion is normal without pain or tenderness    Lumbar extension is full without pain or tenderness   Left lateral bend is normal without pain or tenderness   Right lateral bend is normal without pain or tenderness   Straight leg raising is normal  Strength & tone: normal   Stability overall normal stability The right knee has slight effusion but nearly full ROM.  The knee is stable.  NV intact. All other systems reviewed and are negative   The patient has been educated about the nature of the problem(s) and counseled on treatment options.  The patient appeared to understand what I have discussed and is in  agreement with it.  Encounter Diagnoses  Name Primary?  . Chronic midline low back pain without sciatica Yes  . Chronic pain of right knee     PLAN Call if any problems.  Precautions discussed.  Continue current medications.   Return to clinic 3 months   I have reviewed the [place] web site prior to prescribing narcotic medicine for this patient.   Electronically Signed Sanjuana Kava, MD 4/20/20211:44 PM

## 2019-10-24 ENCOUNTER — Other Ambulatory Visit: Payer: Self-pay | Admitting: Orthopaedic Surgery

## 2019-11-07 ENCOUNTER — Other Ambulatory Visit: Payer: Self-pay | Admitting: Orthopaedic Surgery

## 2019-11-22 ENCOUNTER — Other Ambulatory Visit: Payer: Self-pay | Admitting: Orthopaedic Surgery

## 2019-11-22 MED ORDER — HYDROCODONE-ACETAMINOPHEN 7.5-325 MG PO TABS: ORAL_TABLET | ORAL | 0 refills | Status: DC

## 2019-12-25 ENCOUNTER — Telehealth: Payer: Self-pay | Admitting: Orthopaedic Surgery

## 2019-12-25 NOTE — Telephone Encounter (Signed)
Patient requests refill on Hydrocodone/Acetaminophen 7.5-325  Mgs.  Qty  100  Sig: One tablet every six hours as needed for pain. 30 day limit.       Patient uses [facility]

## 2019-12-25 NOTE — Telephone Encounter (Signed)
I did it on 12-08-2019.  Too early.  Let me know next week.

## 2019-12-26 ENCOUNTER — Telehealth: Payer: Self-pay | Admitting: Orthopaedic Surgery

## 2019-12-26 ENCOUNTER — Other Ambulatory Visit: Payer: Self-pay | Admitting: Orthopaedic Surgery

## 2019-12-26 NOTE — Telephone Encounter (Signed)
Patient left message on our voice mail for Kathie Rhodes to return her call.

## 2020-01-02 NOTE — Telephone Encounter (Signed)
Patient came to office. Her request is an update to her [place] form which had been in review. Discussed with patient. Form to be completed in house, since patient had already done initial form through [employer].

## 2020-01-10 ENCOUNTER — Telehealth: Payer: Self-pay | Admitting: Orthopaedic Surgery

## 2020-01-15 ENCOUNTER — Encounter: Payer: Self-pay | Admitting: Orthopaedic Surgery

## 2020-01-15 ENCOUNTER — Other Ambulatory Visit: Payer: Self-pay

## 2020-01-15 ENCOUNTER — Ambulatory Visit: Payer: BC Managed Care – PPO | Admitting: Orthopaedic Surgery

## 2020-01-15 VITALS — BP 162/96 | HR 80 | Ht 67.5 in | Wt 152.2 lb

## 2020-01-15 DIAGNOSIS — M545 Low back pain: Secondary | ICD-10-CM

## 2020-01-15 DIAGNOSIS — G8929 Other chronic pain: Secondary | ICD-10-CM

## 2020-01-15 DIAGNOSIS — M25561 Pain in right knee: Secondary | ICD-10-CM | POA: Diagnosis not present

## 2020-01-15 NOTE — Progress Notes (Signed)
Patient ZD:GLOVFIEPPI Sandra Davies, female DOB:11-07-1960, 59 y.o. [id number]  Chief Complaint  Patient presents with  . Knee Pain    R/some swelling on feet for 12 hours at work    HPI  Sandra Davies is a 59 y.o. female who has chronic right knee pain and lower back pain.  She has good and bad days.  She is working 12 hour shifts.  She has no new trauma.  No weakness, no giving way.  She is taking her medicine.   Body mass index is 23.49 kg/m.  ROS  Review of Systems  Constitutional: Positive for activity change.       Patient does not have Diabetes Mellitus. Patient does not have hypertension. Patient does not have COPD or shortness of breath. Patient does not have BMI > 35. Patient does not have current smoking history.   HENT: Negative for congestion.   Respiratory: Negative for cough and shortness of breath.   Cardiovascular: Negative for chest pain.  Gastrointestinal: Positive for constipation.       GERD  Endocrine: Positive for cold intolerance.  Musculoskeletal: Positive for back pain and joint swelling (right knee).  All other systems reviewed and are negative.   All other systems reviewed and are negative.  The following is a summary of the past history medically, past history surgically, known current medicines, social history and family history.  This information is gathered electronically by the computer from prior information and documentation.  I review this each visit and have found including this information at this point in the chart is beneficial and informative.    Past Medical History:  Diagnosis Date  . Arthritis   . Constipation - functional    FOR A LONG TIME  . Dysphagia 2015  . GERD (gastroesophageal reflux disease)     Past Surgical History:  Procedure Laterality Date  . ESOPHAGEAL DILATION N/A 09/03/2014   Procedure: ESOPHAGEAL DILATION;  Surgeon: West Bali, MD;  [facility];  Service: Endoscopy;  Laterality: N/A;  .  ESOPHAGOGASTRODUODENOSCOPY N/A 09/03/2014   [id number] gastritis/dysphagia due to distal esophagel stricture  . ESOPHAGOGASTRODUODENOSCOPY N/A 09/26/2014   TDD:UKGU non-erosive gastritis/stricture at the gastroesphageal  . KNEE SURGERY      Family History  Problem Relation Age of Onset  . Breast cancer Mother        DECEASED  . Alzheimer's disease Father   . Heart disease Brother   . Lupus Sister   . Colon polyps Neg Hx   . Colon cancer Neg Hx   . Stomach cancer Neg Hx     Social History Social History   Tobacco Use  . Smoking status: Never Smoker  . Smokeless tobacco: Never Used  Substance Use Topics  . Alcohol use: Yes    Alcohol/week: 0.0 standard drinks    Comment: very occasional  . Drug use: No    No Known Allergies  Current Outpatient Medications  Medication Sig Dispense Refill  . cyclobenzaprine (FLEXERIL) 10 MG tablet TAKE 1 TABLET BY MOUTH EVERYDAY AT BEDTIME 30 tablet 3  . diclofenac (VOLTAREN) 75 MG EC tablet TAKE 1 TABLET (75 MG TOTAL) BY MOUTH 2 (TWO) TIMES DAILY WITH A MEAL. 180 tablet 1  . diphenhydrAMINE (BENADRYL) 25 MG tablet Take 25 mg by mouth every 6 (six) hours as needed for allergies. Reported on 08/21/2015    . HYDROcodone-acetaminophen (NORCO) 7.5-325 MG tablet One tablet every six hours as needed for pain.  30 day limit. 100 tablet  0  . omeprazole (PRILOSEC) 20 MG capsule TAKE 1 CAPSULE BY MOUTH 30 MINUTES PRIOR TO BREAKFAST AND SUPPER 60 capsule 11  . predniSONE (STERAPRED UNI-PAK 21 TAB) 5 MG (21) TBPK tablet Take 6 pills first day; 5 pills second day; 4 pills third day; 3 pills fourth day; 2 pills next day and 1 pill last day. 21 tablet 0  . pseudoephedrine-acetaminophen (TYLENOL SINUS) 30-500 MG TABS tablet Take 1 tablet by mouth every 4 (four) hours as needed.     No current facility-administered medications for this visit.     Physical Exam  Blood pressure (!) 162/96, pulse 80, height 5' 7.5" (1.715 m), weight 152 lb 4 oz (69.1 kg),  last menstrual period 07/02/2013.  Constitutional: overall normal hygiene, normal nutrition, well developed, normal grooming, normal body habitus. Assistive device:none  Musculoskeletal: gait and station Limp right, muscle tone and strength are normal, no tremors or atrophy is present.  .  Neurological: coordination overall normal.  Deep tendon reflex/nerve stretch intact.  Sensation normal.  Cranial nerves II-XII intact.   Skin:   Normal overall no scars, lesions, ulcers or rashes. No psoriasis.  Psychiatric: Alert and oriented x 3.  Recent memory intact, remote memory unclear.  Normal mood and affect. Well groomed.  Good eye contact.  Cardiovascular: overall no swelling, no varicosities, no edema bilaterally, normal temperatures of the legs and arms, no clubbing, cyanosis and good capillary refill.  Lymphatic: palpation is normal.  Right knee has slight effusion, crepitus, ROM 0 to 110, stable, limp right.  Spine/Pelvis examination:  Inspection:  Overall, sacoiliac joint benign and hips nontender; without crepitus or defects.   Thoracic spine inspection: Alignment normal without kyphosis present   Lumbar spine inspection:  Alignment  with normal lumbar lordosis, without scoliosis apparent.   Thoracic spine palpation:  without tenderness of spinal processes   Lumbar spine palpation: without tenderness of lumbar area; without tightness of lumbar muscles    Range of Motion:   Lumbar flexion, forward flexion is normal without pain or tenderness    Lumbar extension is full without pain or tenderness   Left lateral bend is normal without pain or tenderness   Right lateral bend is normal without pain or tenderness   Straight leg raising is normal  Strength & tone: normal   Stability overall normal stability  All other systems reviewed and are negative   The patient has been educated about the nature of the problem(s) and counseled on treatment options.  The patient appeared to  understand what I have discussed and is in agreement with it.  Encounter Diagnoses  Name Primary?  . Chronic pain of right knee Yes  . Chronic midline low back pain without sciatica     PLAN Call if any problems.  Precautions discussed.  Continue current medications.   Return to clinic 3 months   Electronically Signed Darreld Mclean, MD 7/13/20212:02 PM

## 2020-01-17 NOTE — Telephone Encounter (Signed)
Patient again relays that [facility] does not have her prescription per Dr Sanjuan Dame refill 01/15/20. Also, [facility] in [place], [place], called to relay that the prescription is not there. Please review and please advise.

## 2020-01-18 ENCOUNTER — Other Ambulatory Visit: Payer: Self-pay | Admitting: Orthopaedic Surgery

## 2020-01-18 ENCOUNTER — Other Ambulatory Visit: Payer: Self-pay | Admitting: Orthopedic Surgery

## 2020-01-18 MED ORDER — CYCLOBENZAPRINE HCL 10 MG PO TABS: ORAL_TABLET | ORAL | 3 refills | Status: DC

## 2020-01-18 NOTE — Telephone Encounter (Signed)
Re sent fri

## 2020-01-18 NOTE — Telephone Encounter (Signed)
This is Dr Sanjuan Dame patient.  She was in the office recently and says Dr Hilda Lias was to refill her hydrocodone.  I see his chart note says she will continue her current medications.  Hydrocodone was not sent to pharmacy.  Can you refill for her?  Or do we need to hold this for Dr Hilda Lias?

## 2020-01-21 MED ORDER — HYDROCODONE-ACETAMINOPHEN 7.5-325 MG PO TABS: ORAL_TABLET | ORAL | 0 refills | Status: DC

## 2020-01-22 ENCOUNTER — Ambulatory Visit: Payer: BC Managed Care – PPO | Admitting: Orthopaedic Surgery

## 2020-02-13 ENCOUNTER — Other Ambulatory Visit: Payer: Self-pay | Admitting: Orthopaedic Surgery

## 2020-02-14 MED ORDER — HYDROCODONE-ACETAMINOPHEN 7.5-325 MG PO TABS: ORAL_TABLET | ORAL | 0 refills | Status: DC

## 2020-03-19 ENCOUNTER — Other Ambulatory Visit: Payer: Self-pay | Admitting: Orthopaedic Surgery

## 2020-03-20 MED ORDER — HYDROCODONE-ACETAMINOPHEN 7.5-325 MG PO TABS: ORAL_TABLET | ORAL | 0 refills | Status: DC

## 2020-04-01 ENCOUNTER — Ambulatory Visit: Payer: BC Managed Care – PPO | Admitting: Orthopaedic Surgery

## 2020-04-01 ENCOUNTER — Encounter: Payer: Self-pay | Admitting: Orthopaedic Surgery

## 2020-04-01 ENCOUNTER — Other Ambulatory Visit: Payer: Self-pay

## 2020-04-01 VITALS — BP 157/96 | HR 81 | Ht 67.5 in | Wt 157.0 lb

## 2020-04-01 DIAGNOSIS — M545 Low back pain: Secondary | ICD-10-CM | POA: Diagnosis not present

## 2020-04-01 DIAGNOSIS — G8929 Other chronic pain: Secondary | ICD-10-CM | POA: Diagnosis not present

## 2020-04-01 NOTE — Patient Instructions (Signed)
Out of work x 2weeks

## 2020-04-01 NOTE — Progress Notes (Signed)
Patient DG:LOVFIEPPIR Sandra Davies, female DOB:23-Sep-1960, 59 y.o. JJO:[id number]  Chief Complaint  Patient presents with  . Back Pain  . Knee Pain    HPI  Sandra Davies is a 59 y.o. female who has increasing lower back pain with some right sided sciatica at times.  She is working 12 hour days 7 days a week.  She has no trauma.  She is taking her medicine which helps some.  I will keep her out of work.  She knows the exercises to do.     Body mass index is 24.23 kg/m.  ROS  Review of Systems  Constitutional: Positive for activity change.       Patient does not have Diabetes Mellitus. Patient does not have hypertension. Patient does not have COPD or shortness of breath. Patient does not have BMI > 35. Patient does not have current smoking history.   HENT: Negative for congestion.   Respiratory: Negative for cough and shortness of breath.   Cardiovascular: Negative for chest pain.  Gastrointestinal: Positive for constipation.       GERD  Endocrine: Positive for cold intolerance.  Musculoskeletal: Positive for back pain and joint swelling (right knee).  All other systems reviewed and are negative.   All other systems reviewed and are negative.  The following is a summary of the past history medically, past history surgically, known current medicines, social history and family history.  This information is gathered electronically by the computer from prior information and documentation.  I review this each visit and have found including this information at this point in the chart is beneficial and informative.    Past Medical History:  Diagnosis Date  . Arthritis   . Constipation - functional    FOR A LONG TIME  . Dysphagia 2015  . GERD (gastroesophageal reflux disease)     Past Surgical History:  Procedure Laterality Date  . ESOPHAGEAL DILATION N/A 09/03/2014   Procedure: ESOPHAGEAL DILATION;  Surgeon: West Bali, MD;  Location: [facility];  Service: Endoscopy;   Laterality: N/A;  . ESOPHAGOGASTRODUODENOSCOPY N/A 09/03/2014   ZSW:[id number] gastritis/dysphagia due to distal esophagel stricture  . ESOPHAGOGASTRODUODENOSCOPY N/A 09/26/2014   DDU:KGUR non-erosive gastritis/stricture at the gastroesphageal  . KNEE SURGERY      Family History  Problem Relation Age of Onset  . Breast cancer Mother        DECEASED  . Alzheimer's disease Father   . Heart disease Brother   . Lupus Sister   . Colon polyps Neg Hx   . Colon cancer Neg Hx   . Stomach cancer Neg Hx     Social History Social History   Tobacco Use  . Smoking status: Never Smoker  . Smokeless tobacco: Never Used  Substance Use Topics  . Alcohol use: Yes    Alcohol/week: 0.0 standard drinks    Comment: very occasional  . Drug use: No    No Known Allergies  Current Outpatient Medications  Medication Sig Dispense Refill  . cyclobenzaprine (FLEXERIL) 10 MG tablet 1 po tid 30 tablet 3  . diclofenac (VOLTAREN) 75 MG EC tablet TAKE 1 TABLET (75 MG TOTAL) BY MOUTH 2 (TWO) TIMES DAILY WITH A MEAL. 180 tablet 1  . diphenhydrAMINE (BENADRYL) 25 MG tablet Take 25 mg by mouth every 6 (six) hours as needed for allergies. Reported on 08/21/2015    . HYDROcodone-acetaminophen (NORCO) 7.5-325 MG tablet One tablet every six hours as needed for pain.  30 day limit. 100 tablet  0  . omeprazole (PRILOSEC) 20 MG capsule TAKE 1 CAPSULE BY MOUTH 30 MINUTES PRIOR TO BREAKFAST AND SUPPER 60 capsule 11  . predniSONE (STERAPRED UNI-PAK 21 TAB) 5 MG (21) TBPK tablet Take 6 pills first day; 5 pills second day; 4 pills third day; 3 pills fourth day; 2 pills next day and 1 pill last day. 21 tablet 0  . pseudoephedrine-acetaminophen (TYLENOL SINUS) 30-500 MG TABS tablet Take 1 tablet by mouth every 4 (four) hours as needed.     No current facility-administered medications for this visit.     Physical Exam  Blood pressure (!) 157/96, pulse 81, height 5' 7.5" (1.715 m), weight 157 lb (71.2 kg), last menstrual period  07/02/2013.  Constitutional: overall normal hygiene, normal nutrition, well developed, normal grooming, normal body habitus. Assistive device:none  Musculoskeletal: gait and station Limp none, muscle tone and strength are normal, no tremors or atrophy is present.  .  Neurological: coordination overall normal.  Deep tendon reflex/nerve stretch intact.  Sensation normal.  Cranial nerves II-XII intact.   Skin:   Normal overall no scars, lesions, ulcers or rashes. No psoriasis.  Psychiatric: Alert and oriented x 3.  Recent memory intact, remote memory unclear.  Normal mood and affect. Well groomed.  Good eye contact.  Cardiovascular: overall no swelling, no varicosities, no edema bilaterally, normal temperatures of the legs and arms, no clubbing, cyanosis and good capillary refill.  Lymphatic: palpation is normal.  Spine/Pelvis examination:  Inspection:  Overall, sacoiliac joint benign and hips nontender; without crepitus or defects.   Thoracic spine inspection: Alignment normal without kyphosis present   Lumbar spine inspection:  Alignment  with normal lumbar lordosis, without scoliosis apparent.   Thoracic spine palpation:  without tenderness of spinal processes   Lumbar spine palpation: without tenderness of lumbar area; without tightness of lumbar muscles    Range of Motion:   Lumbar flexion, forward flexion is normal without pain or tenderness    Lumbar extension is full without pain or tenderness   Left lateral bend is normal without pain or tenderness   Right lateral bend is normal without pain or tenderness   Straight leg raising is normal  Strength & tone: normal   Stability overall normal stability  All other systems reviewed and are negative   The patient has been educated about the nature of the problem(s) and counseled on treatment options.  The patient appeared to understand what I have discussed and is in agreement with it.  Encounter Diagnosis  Name Primary?  .  Chronic midline low back pain without sciatica Yes    PLAN Call if any problems.  Precautions discussed.  Continue current medications.   Return to clinic 2 weeks   Out of work.  Electronically Signed Darreld Mclean, MD 9/28/202110:22 AM

## 2020-04-15 ENCOUNTER — Encounter: Payer: Self-pay | Admitting: Orthopaedic Surgery

## 2020-04-15 ENCOUNTER — Other Ambulatory Visit: Payer: Self-pay

## 2020-04-15 ENCOUNTER — Ambulatory Visit: Payer: BC Managed Care – PPO | Admitting: Orthopaedic Surgery

## 2020-04-15 VITALS — BP 153/98 | HR 76 | Ht 67.5 in | Wt 157.0 lb

## 2020-04-15 DIAGNOSIS — M545 Low back pain, unspecified: Secondary | ICD-10-CM | POA: Diagnosis not present

## 2020-04-15 DIAGNOSIS — M25561 Pain in right knee: Secondary | ICD-10-CM

## 2020-04-15 DIAGNOSIS — G8929 Other chronic pain: Secondary | ICD-10-CM

## 2020-04-15 MED ORDER — HYDROCODONE-ACETAMINOPHEN 7.5-325 MG PO TABS
ORAL_TABLET | ORAL | 0 refills | Status: DC
Start: 2020-04-15 — End: 2020-05-21

## 2020-04-15 NOTE — Patient Instructions (Signed)
Return to work Monday.

## 2020-04-15 NOTE — Progress Notes (Signed)
Patient Sandra Davies [name], female DOB:06/08/1961, 59 y.o. WJX:[id number]  Chief Complaint  Patient presents with  . Back Pain  . Knee Pain  . Medication Refill    HPI  [name] is a 59 y.o. female who has lower back pain and right knee pain.  Her back is better.  Her right knee is hurting and has swelling and pain.  She would like an injection.  I will have her return to work Monday.   Body mass index is 24.23 kg/m.  ROS  Review of Systems  Constitutional: Positive for activity change.       Patient does not have Diabetes Mellitus. Patient does not have hypertension. Patient does not have COPD or shortness of breath. Patient does not have BMI > 35. Patient does not have current smoking history.   HENT: Negative for congestion.   Respiratory: Negative for cough and shortness of breath.   Cardiovascular: Negative for chest pain.  Gastrointestinal: Positive for constipation.       GERD  Endocrine: Positive for cold intolerance.  Musculoskeletal: Positive for back pain and joint swelling (right knee).  All other systems reviewed and are negative.   All other systems reviewed and are negative.  The following is a summary of the past history medically, past history surgically, known current medicines, social history and family history.  This information is gathered electronically by the computer from prior information and documentation.  I review this each visit and have found including this information at this point in the chart is beneficial and informative.    Past Medical History:  Diagnosis Date  . Arthritis   . Constipation - functional    FOR A LONG TIME  . Dysphagia 2015  . GERD (gastroesophageal reflux disease)     Past Surgical History:  Procedure Laterality Date  . ESOPHAGEAL DILATION N/A 09/03/2014   Procedure: ESOPHAGEAL DILATION;  Surgeon: West Bali, MD;  Location: [facility];  Service: Endoscopy;  Laterality: N/A;  .  ESOPHAGOGASTRODUODENOSCOPY N/A 09/03/2014   OZH:[id number] gastritis/dysphagia due to distal esophagel stricture  . ESOPHAGOGASTRODUODENOSCOPY N/A 09/26/2014   NGE:XBMW non-erosive gastritis/stricture at the gastroesphageal  . KNEE SURGERY      Family History  Problem Relation Age of Onset  . Breast cancer Mother        DECEASED  . Alzheimer's disease Father   . Heart disease Brother   . Lupus Sister   . Colon polyps Neg Hx   . Colon cancer Neg Hx   . Stomach cancer Neg Hx     Social History Social History   Tobacco Use  . Smoking status: Never Smoker  . Smokeless tobacco: Never Used  Substance Use Topics  . Alcohol use: Yes    Alcohol/week: 0.0 standard drinks    Comment: very occasional  . Drug use: No    No Known Allergies  Current Outpatient Medications  Medication Sig Dispense Refill  . cyclobenzaprine (FLEXERIL) 10 MG tablet 1 po tid 30 tablet 3  . diclofenac (VOLTAREN) 75 MG EC tablet TAKE 1 TABLET (75 MG TOTAL) BY MOUTH 2 (TWO) TIMES DAILY WITH A MEAL. 180 tablet 1  . diphenhydrAMINE (BENADRYL) 25 MG tablet Take 25 mg by mouth every 6 (six) hours as needed for allergies. Reported on 08/21/2015    . HYDROcodone-acetaminophen (NORCO) 7.5-325 MG tablet One tablet every six hours as needed for pain.  30 day limit. 100 tablet 0  . omeprazole (PRILOSEC) 20 MG capsule TAKE 1 CAPSULE  BY MOUTH 30 MINUTES PRIOR TO BREAKFAST AND SUPPER 60 capsule 11  . predniSONE (STERAPRED UNI-PAK 21 TAB) 5 MG (21) TBPK tablet Take 6 pills first day; 5 pills second day; 4 pills third day; 3 pills fourth day; 2 pills next day and 1 pill last day. 21 tablet 0  . pseudoephedrine-acetaminophen (TYLENOL SINUS) 30-500 MG TABS tablet Take 1 tablet by mouth every 4 (four) hours as needed.     No current facility-administered medications for this visit.     Physical Exam  Blood pressure (!) 153/98, pulse 76, height 5' 7.5" (1.715 m), weight 157 lb (71.2 kg), last menstrual period  07/02/2013.  Constitutional: overall normal hygiene, normal nutrition, well developed, normal grooming, normal body habitus. Assistive device:none  Musculoskeletal: gait and station Limp right, muscle tone and strength are normal, no tremors or atrophy is present.  .  Neurological: coordination overall normal.  Deep tendon reflex/nerve stretch intact.  Sensation normal.  Cranial nerves II-XII intact.   Skin:   Normal overall no scars, lesions, ulcers or rashes. No psoriasis.  Psychiatric: Alert and oriented x 3.  Recent memory intact, remote memory unclear.  Normal mood and affect. Well groomed.  Good eye contact.  Cardiovascular: overall no swelling, no varicosities, no edema bilaterally, normal temperatures of the legs and arms, no clubbing, cyanosis and good capillary refill.  Lymphatic: palpation is normal.  Right knee tender, effusion, crepitus, ROM 0 to 110, stable, NV intact.  Limp right.  Spine/Pelvis examination:  Inspection:  Overall, sacoiliac joint benign and hips nontender; without crepitus or defects.   Thoracic spine inspection: Alignment normal without kyphosis present   Lumbar spine inspection:  Alignment  with normal lumbar lordosis, without scoliosis apparent.   Thoracic spine palpation:  without tenderness of spinal processes   Lumbar spine palpation: without tenderness of lumbar area; without tightness of lumbar muscles    Range of Motion:   Lumbar flexion, forward flexion is normal without pain or tenderness    Lumbar extension is full without pain or tenderness   Left lateral bend is normal without pain or tenderness   Right lateral bend is normal without pain or tenderness   Straight leg raising is normal  Strength & tone: normal   Stability overall normal stability  All other systems reviewed and are negative   The patient has been educated about the nature of the problem(s) and counseled on treatment options.  The patient appeared to understand what  I have discussed and is in agreement with it.  Encounter Diagnoses  Name Primary?  . Chronic pain of right knee Yes  . Chronic midline low back pain without sciatica    PROCEDURE NOTE:  The patient requests injections of the right knee , verbal consent was obtained.  The right knee was prepped appropriately after time out was performed.   Sterile technique was observed and injection of 1 cc of Depo-Medrol 40 mg with several cc's of plain xylocaine. Anesthesia was provided by ethyl chloride and a 20-gauge needle was used to inject the knee area. The injection was tolerated well.  A band aid dressing was applied.  The patient was advised to apply ice later today and tomorrow to the injection sight as needed.  PLAN Call if any problems.  Precautions discussed.  Continue current medications.   Return to clinic 1 month   I have reviewed the [facility] Controlled Substance Reporting System web site prior to prescribing narcotic medicine for this patient.   Electronically  Signed Darreld Mclean, MD 10/12/202111:45 AM

## 2020-04-29 ENCOUNTER — Other Ambulatory Visit: Payer: Self-pay | Admitting: Orthopaedic Surgery

## 2020-05-13 ENCOUNTER — Encounter: Payer: Self-pay | Admitting: Orthopaedic Surgery

## 2020-05-13 ENCOUNTER — Other Ambulatory Visit: Payer: Self-pay

## 2020-05-13 ENCOUNTER — Ambulatory Visit: Payer: BC Managed Care – PPO | Admitting: Orthopaedic Surgery

## 2020-05-13 VITALS — BP 131/86 | HR 78 | Ht 67.5 in | Wt 157.0 lb

## 2020-05-13 DIAGNOSIS — M545 Low back pain, unspecified: Secondary | ICD-10-CM

## 2020-05-13 DIAGNOSIS — G8929 Other chronic pain: Secondary | ICD-10-CM | POA: Diagnosis not present

## 2020-05-13 DIAGNOSIS — M25561 Pain in right knee: Secondary | ICD-10-CM

## 2020-05-13 MED ORDER — CYCLOBENZAPRINE HCL 10 MG PO TABS
ORAL_TABLET | ORAL | 3 refills | Status: DC
Start: 2020-05-13 — End: 2020-09-09

## 2020-05-13 NOTE — Progress Notes (Signed)
Patient [name] Sandra Davies, female DOB:1961-06-18, 59 y.o. BTD:[id number]  Chief Complaint  Patient presents with  . Back Pain    Chronic back pain    HPI  Sandra Davies is a 59 y.o. female who has chronic lower back pain.  She is working 12 hours shifts every day.  She has no new trauma, no weakness.  She is taking her medicine and doing her exercises. She has good and bad days. Cold weather makes her worse.   Body mass index is 24.23 kg/m.  ROS  Review of Systems  Constitutional: Positive for activity change.       Patient does not have Diabetes Mellitus. Patient does not have hypertension. Patient does not have COPD or shortness of breath. Patient does not have BMI > 35. Patient does not have current smoking history.   HENT: Negative for congestion.   Respiratory: Negative for cough and shortness of breath.   Cardiovascular: Negative for chest pain.  Gastrointestinal: Positive for constipation.       GERD  Endocrine: Positive for cold intolerance.  Musculoskeletal: Positive for back pain and joint swelling (right knee).  All other systems reviewed and are negative.   All other systems reviewed and are negative.  The following is a summary of the past history medically, past history surgically, known current medicines, social history and family history.  This information is gathered electronically by the computer from prior information and documentation.  I review this each visit and have found including this information at this point in the chart is beneficial and informative.    Past Medical History:  Diagnosis Date  . Arthritis   . Constipation - functional    FOR A LONG TIME  . Dysphagia 2015  . GERD (gastroesophageal reflux disease)     Past Surgical History:  Procedure Laterality Date  . ESOPHAGEAL DILATION N/A 09/03/2014   Procedure: ESOPHAGEAL DILATION;  Surgeon: West Bali, MD;  Location: [facility];  Service: Endoscopy;  Laterality: N/A;  .  ESOPHAGOGASTRODUODENOSCOPY N/A 09/03/2014   TGG:YIRSWNIO gastritis/dysphagia due to distal esophagel stricture  . ESOPHAGOGASTRODUODENOSCOPY N/A 09/26/2014   EVO:JJKK non-erosive gastritis/stricture at the gastroesphageal  . KNEE SURGERY      Family History  Problem Relation Age of Onset  . Breast cancer Mother        DECEASED  . Alzheimer's disease Father   . Heart disease Brother   . Lupus Sister   . Colon polyps Neg Hx   . Colon cancer Neg Hx   . Stomach cancer Neg Hx     Social History Social History   Tobacco Use  . Smoking status: Never Smoker  . Smokeless tobacco: Never Used  Substance Use Topics  . Alcohol use: Yes    Alcohol/week: 0.0 standard drinks    Comment: very occasional  . Drug use: No    No Known Allergies  Current Outpatient Medications  Medication Sig Dispense Refill  . diclofenac (VOLTAREN) 75 MG EC tablet TAKE 1 TABLET (75 MG TOTAL) BY MOUTH 2 (TWO) TIMES DAILY WITH A MEAL. 180 tablet 1  . cyclobenzaprine (FLEXERIL) 10 MG tablet 1 po tid 30 tablet 3  . diphenhydrAMINE (BENADRYL) 25 MG tablet Take 25 mg by mouth every 6 (six) hours as needed for allergies. Reported on 08/21/2015    . HYDROcodone-acetaminophen (NORCO) 7.5-325 MG tablet One tablet every six hours as needed for pain.  30 day limit. 100 tablet 0  . omeprazole (PRILOSEC) 20 MG capsule TAKE  1 CAPSULE BY MOUTH 30 MINUTES PRIOR TO BREAKFAST AND SUPPER 60 capsule 11  . predniSONE (STERAPRED UNI-PAK 21 TAB) 5 MG (21) TBPK tablet Take 6 pills first day; 5 pills second day; 4 pills third day; 3 pills fourth day; 2 pills next day and 1 pill last day. 21 tablet 0  . pseudoephedrine-acetaminophen (TYLENOL SINUS) 30-500 MG TABS tablet Take 1 tablet by mouth every 4 (four) hours as needed.     No current facility-administered medications for this visit.     Physical Exam  Blood pressure 131/86, pulse 78, height 5' 7.5" (1.715 m), weight 157 lb (71.2 kg), last menstrual period  07/02/2013.  Constitutional: overall normal hygiene, normal nutrition, well developed, normal grooming, normal body habitus. Assistive device:none  Musculoskeletal: gait and station Limp none, muscle tone and strength are normal, no tremors or atrophy is present.  .  Neurological: coordination overall normal.  Deep tendon reflex/nerve stretch intact.  Sensation normal.  Cranial nerves II-XII intact.   Skin:   Normal overall no scars, lesions, ulcers or rashes. No psoriasis.  Psychiatric: Alert and oriented x 3.  Recent memory intact, remote memory unclear.  Normal mood and affect. Well groomed.  Good eye contact.  Cardiovascular: overall no swelling, no varicosities, no edema bilaterally, normal temperatures of the legs and arms, no clubbing, cyanosis and good capillary refill.  Lymphatic: palpation is normal.  Spine/Pelvis examination:  Inspection:  Overall, sacoiliac joint benign and hips nontender; without crepitus or defects.   Thoracic spine inspection: Alignment normal without kyphosis present   Lumbar spine inspection:  Alignment  with normal lumbar lordosis, without scoliosis apparent.   Thoracic spine palpation:  without tenderness of spinal processes   Lumbar spine palpation: without tenderness of lumbar area; without tightness of lumbar muscles    Range of Motion:   Lumbar flexion, forward flexion is normal without pain or tenderness    Lumbar extension is full without pain or tenderness   Left lateral bend is normal without pain or tenderness   Right lateral bend is normal without pain or tenderness   Straight leg raising is normal  Strength & tone: normal   Stability overall normal stability All other systems reviewed and are negative   The patient has been educated about the nature of the problem(s) and counseled on treatment options.  The patient appeared to understand what I have discussed and is in agreement with it.  Encounter Diagnoses  Name Primary?  .  Chronic pain of right knee Yes  . Chronic midline low back pain without sciatica     PLAN Call if any problems.  Precautions discussed.  Continue current medications.   Return to clinic 2 months   I refilled her Flexeril.  Electronically Signed Darreld Mclean, MD 11/9/20211:46 PM

## 2020-05-21 ENCOUNTER — Other Ambulatory Visit: Payer: Self-pay | Admitting: Orthopaedic Surgery

## 2020-05-21 MED ORDER — HYDROCODONE-ACETAMINOPHEN 7.5-325 MG PO TABS
ORAL_TABLET | ORAL | 0 refills | Status: DC
Start: 2020-05-21 — End: 2020-06-19

## 2020-05-27 DIAGNOSIS — R5383 Other fatigue: Secondary | ICD-10-CM | POA: Diagnosis not present

## 2020-05-27 DIAGNOSIS — M25559 Pain in unspecified hip: Secondary | ICD-10-CM | POA: Diagnosis not present

## 2020-05-27 DIAGNOSIS — Z124 Encounter for screening for malignant neoplasm of cervix: Secondary | ICD-10-CM | POA: Diagnosis not present

## 2020-05-27 DIAGNOSIS — E559 Vitamin D deficiency, unspecified: Secondary | ICD-10-CM | POA: Diagnosis not present

## 2020-06-19 ENCOUNTER — Other Ambulatory Visit: Payer: Self-pay | Admitting: Orthopaedic Surgery

## 2020-06-19 MED ORDER — HYDROCODONE-ACETAMINOPHEN 7.5-325 MG PO TABS
1.0000 | ORAL_TABLET | Freq: Four times a day (QID) | ORAL | 0 refills | Status: DC | PRN
Start: 2020-06-19 — End: 2020-06-23

## 2020-06-21 DIAGNOSIS — R5382 Chronic fatigue, unspecified: Secondary | ICD-10-CM | POA: Diagnosis not present

## 2020-06-21 DIAGNOSIS — E559 Vitamin D deficiency, unspecified: Secondary | ICD-10-CM | POA: Diagnosis not present

## 2020-06-21 DIAGNOSIS — Z0001 Encounter for general adult medical examination with abnormal findings: Secondary | ICD-10-CM | POA: Diagnosis not present

## 2020-06-21 DIAGNOSIS — M25559 Pain in unspecified hip: Secondary | ICD-10-CM | POA: Diagnosis not present

## 2020-06-23 ENCOUNTER — Other Ambulatory Visit: Payer: Self-pay

## 2020-06-23 DIAGNOSIS — Z124 Encounter for screening for malignant neoplasm of cervix: Secondary | ICD-10-CM | POA: Diagnosis not present

## 2020-06-23 DIAGNOSIS — E559 Vitamin D deficiency, unspecified: Secondary | ICD-10-CM | POA: Diagnosis not present

## 2020-06-23 DIAGNOSIS — R5383 Other fatigue: Secondary | ICD-10-CM | POA: Diagnosis not present

## 2020-06-23 DIAGNOSIS — M25559 Pain in unspecified hip: Secondary | ICD-10-CM | POA: Diagnosis not present

## 2020-06-24 MED ORDER — HYDROCODONE-ACETAMINOPHEN 7.5-325 MG PO TABS
1.0000 | ORAL_TABLET | Freq: Four times a day (QID) | ORAL | 0 refills | Status: AC | PRN
Start: 2020-06-24 — End: 2020-06-29

## 2020-07-01 DIAGNOSIS — E559 Vitamin D deficiency, unspecified: Secondary | ICD-10-CM | POA: Diagnosis not present

## 2020-07-01 DIAGNOSIS — K5900 Constipation, unspecified: Secondary | ICD-10-CM | POA: Diagnosis not present

## 2020-07-01 DIAGNOSIS — R7301 Impaired fasting glucose: Secondary | ICD-10-CM | POA: Diagnosis not present

## 2020-07-01 DIAGNOSIS — K9041 Non-celiac gluten sensitivity: Secondary | ICD-10-CM | POA: Diagnosis not present

## 2020-07-02 ENCOUNTER — Other Ambulatory Visit: Payer: Self-pay

## 2020-07-02 ENCOUNTER — Ambulatory Visit (HOSPITAL_COMMUNITY)
Admission: RE | Admit: 2020-07-02 | Discharge: 2020-07-02 | Disposition: A | Discharge status: Home / Self Care | Payer: BC Managed Care – PPO | Source: Ambulatory Visit | Attending: Internal Medicine | Admitting: Internal Medicine

## 2020-07-02 ENCOUNTER — Telehealth: Payer: Self-pay | Admitting: Orthopaedic Surgery

## 2020-07-02 DIAGNOSIS — Z1231 Encounter for screening mammogram for malignant neoplasm of breast: Secondary | ICD-10-CM | POA: Insufficient documentation

## 2020-07-02 MED ORDER — HYDROCODONE-ACETAMINOPHEN 7.5-325 MG PO TABS: 1.0000 | ORAL_TABLET | Freq: Four times a day (QID) | ORAL | 0 refills | Status: AC | PRN

## 2020-07-02 NOTE — Telephone Encounter (Signed)
Patient of Dr Hilda Lias requests refill on  Hydrocodone/Acetaminophen 7.5-325 mgs.  Qty  20      Sig: Take 1 tablet by mouth every 6 (six) hours as needed for up to 5 days for moderate pain. One tablet every six hours as needed for pain.   Patient uses [facility]

## 2020-07-09 ENCOUNTER — Encounter: Payer: Self-pay | Admitting: Internal Medicine

## 2020-07-14 ENCOUNTER — Telehealth: Payer: Self-pay | Admitting: Orthopaedic Surgery

## 2020-07-14 NOTE — Telephone Encounter (Signed)
Patient requests refill on Hydrocodone/Acetaminophen 7.5-325 mgs.  Qty  20   Sig: Take 1 tablet by mouth every 6 (six) hours as needed for up to 5 days for moderate pain. One tablet every six hours as needed for pain.  Patient uses [facility]

## 2020-07-15 ENCOUNTER — Encounter: Payer: Self-pay | Admitting: Orthopaedic Surgery

## 2020-07-15 ENCOUNTER — Other Ambulatory Visit: Payer: Self-pay

## 2020-07-15 ENCOUNTER — Ambulatory Visit: Payer: BC Managed Care – PPO | Admitting: Orthopaedic Surgery

## 2020-07-15 VITALS — BP 171/111 | HR 81 | Ht 67.5 in | Wt 157.0 lb

## 2020-07-15 DIAGNOSIS — G8929 Other chronic pain: Secondary | ICD-10-CM | POA: Diagnosis not present

## 2020-07-15 DIAGNOSIS — M545 Low back pain, unspecified: Secondary | ICD-10-CM

## 2020-07-15 DIAGNOSIS — M25561 Pain in right knee: Secondary | ICD-10-CM | POA: Diagnosis not present

## 2020-07-15 MED ORDER — HYDROCODONE-ACETAMINOPHEN 7.5-325 MG PO TABS
1.0000 | ORAL_TABLET | Freq: Four times a day (QID) | ORAL | 0 refills | Status: DC | PRN
Start: 2020-07-15 — End: 2020-08-12

## 2020-07-15 NOTE — Progress Notes (Signed)
Patient Sandra Davies, female DOB:Oct 31, 1960, 60 y.o. YIF:[id number]  Chief Complaint  Patient presents with  . Knee Pain    R/ doing some better  . Back Pain    Doing some better for now    HPI  Sandra Davies is a 60 y.o. female who has chronic lower back pain. She has more pain with the cold weather.  She has no weakness, no trauma.  She is taking her medicine.  Her knee on the right is not that tender today.   Body mass index is 24.23 kg/m.  ROS  Review of Systems  Constitutional: Positive for activity change.       Patient does not have Diabetes Mellitus. Patient does not have hypertension. Patient does not have COPD or shortness of breath. Patient does not have BMI > 35. Patient does not have current smoking history.   HENT: Negative for congestion.   Respiratory: Negative for cough and shortness of breath.   Cardiovascular: Negative for chest pain.  Gastrointestinal: Positive for constipation.       GERD  Endocrine: Positive for cold intolerance.  Musculoskeletal: Positive for back pain and joint swelling (right knee).  All other systems reviewed and are negative.   All other systems reviewed and are negative.  The following is a summary of the past history medically, past history surgically, known current medicines, social history and family history.  This information is gathered electronically by the computer from prior information and documentation.  I review this each visit and have found including this information at this point in the chart is beneficial and informative.    Past Medical History:  Diagnosis Date  . Arthritis   . Constipation - functional    FOR A LONG TIME  . Dysphagia 2015  . GERD (gastroesophageal reflux disease)     Past Surgical History:  Procedure Laterality Date  . ESOPHAGEAL DILATION N/A 09/03/2014   Procedure: ESOPHAGEAL DILATION;  Surgeon: West Bali, MD;  Location: [facility];  Service: Endoscopy;  Laterality:  N/A;  . ESOPHAGOGASTRODUODENOSCOPY N/A 09/03/2014   OMV:EHMCNOBS gastritis/dysphagia due to distal esophagel stricture  . ESOPHAGOGASTRODUODENOSCOPY N/A 09/26/2014   JGG:EZMO non-erosive gastritis/stricture at the gastroesphageal  . KNEE SURGERY      Family History  Problem Relation Age of Onset  . Breast cancer Mother        DECEASED  . Alzheimer's disease Father   . Heart disease Brother   . Lupus Sister   . Colon polyps Neg Hx   . Colon cancer Neg Hx   . Stomach cancer Neg Hx     Social History Social History   Tobacco Use  . Smoking status: Never Smoker  . Smokeless tobacco: Never Used  Substance Use Topics  . Alcohol use: Yes    Alcohol/week: 0.0 standard drinks    Comment: very occasional  . Drug use: No    No Known Allergies  Current Outpatient Medications  Medication Sig Dispense Refill  . diclofenac (VOLTAREN) 75 MG EC tablet TAKE 1 TABLET (75 MG TOTAL) BY MOUTH 2 (TWO) TIMES DAILY WITH A MEAL. 180 tablet 1  . cyclobenzaprine (FLEXERIL) 10 MG tablet 1 po tid 30 tablet 3  . diphenhydrAMINE (BENADRYL) 25 MG tablet Take 25 mg by mouth every 6 (six) hours as needed for allergies. Reported on 08/21/2015    . HYDROcodone-acetaminophen (NORCO) 7.5-325 MG tablet Take 1 tablet by mouth every 6 (six) hours as needed for moderate pain (Must last 30  days.). 100 tablet 0  . omeprazole (PRILOSEC) 20 MG capsule TAKE 1 CAPSULE BY MOUTH 30 MINUTES PRIOR TO BREAKFAST AND SUPPER 60 capsule 11  . predniSONE (STERAPRED UNI-PAK 21 TAB) 5 MG (21) TBPK tablet Take 6 pills first day; 5 pills second day; 4 pills third day; 3 pills fourth day; 2 pills next day and 1 pill last day. 21 tablet 0  . pseudoephedrine-acetaminophen (TYLENOL SINUS) 30-500 MG TABS tablet Take 1 tablet by mouth every 4 (four) hours as needed.     No current facility-administered medications for this visit.     Physical Exam  Blood pressure (!) 171/111, pulse 81, height 5' 7.5" (1.715 m), weight 157 lb (71.2 kg),  last menstrual period 07/02/2013.  Constitutional: overall normal hygiene, normal nutrition, well developed, normal grooming, normal body habitus. Assistive device:none  Musculoskeletal: gait and station Limp none, muscle tone and strength are normal, no tremors or atrophy is present.  .  Neurological: coordination overall normal.  Deep tendon reflex/nerve stretch intact.  Sensation normal.  Cranial nerves II-XII intact.   Skin:   Normal overall no scars, lesions, ulcers or rashes. No psoriasis.  Psychiatric: Alert and oriented x 3.  Recent memory intact, remote memory unclear.  Normal mood and affect. Well groomed.  Good eye contact.  Cardiovascular: overall no swelling, no varicosities, no edema bilaterally, normal temperatures of the legs and arms, no clubbing, cyanosis and good capillary refill.  Lymphatic: palpation is normal.  Spine/Pelvis examination:  Inspection:  Overall, sacoiliac joint benign and hips nontender; without crepitus or defects.   Thoracic spine inspection: Alignment normal without kyphosis present   Lumbar spine inspection:  Alignment  with normal lumbar lordosis, without scoliosis apparent.   Thoracic spine palpation:  without tenderness of spinal processes   Lumbar spine palpation: without tenderness of lumbar area; without tightness of lumbar muscles    Range of Motion:   Lumbar flexion, forward flexion is normal without pain or tenderness    Lumbar extension is full without pain or tenderness   Left lateral bend is normal without pain or tenderness   Right lateral bend is normal without pain or tenderness   Straight leg raising is normal  Strength & tone: normal   Stability overall normal stability  All other systems reviewed and are negative   The patient has been educated about the nature of the problem(s) and counseled on treatment options.  The patient appeared to understand what I have discussed and is in agreement with it.  Encounter  Diagnoses  Name Primary?  . Chronic pain of right knee Yes  . Chronic midline low back pain without sciatica     PLAN Call if any problems.  Precautions discussed.  Continue current medications.   Return to clinic 2 months   Electronically Signed Darreld Mclean, MD 1/11/20221:50 PM

## 2020-07-21 ENCOUNTER — Ambulatory Visit: Payer: BC Managed Care – PPO | Admitting: Gastroenterology

## 2020-08-12 ENCOUNTER — Telehealth: Payer: Self-pay | Admitting: Orthopaedic Surgery

## 2020-08-12 MED ORDER — HYDROCODONE-ACETAMINOPHEN 7.5-325 MG PO TABS
1.0000 | ORAL_TABLET | Freq: Four times a day (QID) | ORAL | 0 refills | Status: DC | PRN
Start: 2020-08-12 — End: 2020-09-09

## 2020-09-01 NOTE — Progress Notes (Deleted)
Referring Provider: Benita Stabile, MD Primary Care Physician:  Benita Stabile, MD Primary Gastroenterologist:  Dr. Marletta Lor  No chief complaint on file.   HPI:   Sandra Davies is a 60 y.o. female presenting today at the request of Benita Stabile, MD for dysphagia and esophageal obstruction.  Patient has history of dysphagia with stricture at GE junction s/p dilation and biopsy in March 2016.  Also with history of gastritis noted on EGD as well.  Esophageal biopsy pathology with inflammation, no increased intraepithelial eosinophils. Dr. Darrick Penna had recommended omeprazole BID x 3 months, then daily forever, and repeat EGD in 3 months. We have not seen patient since EGD.   Today:   Past Medical History:  Diagnosis Date  . Arthritis   . Constipation - functional    FOR A LONG TIME  . Dysphagia 2015  . GERD (gastroesophageal reflux disease)     Past Surgical History:  Procedure Laterality Date  . ESOPHAGEAL DILATION N/A 09/03/2014   Procedure: ESOPHAGEAL DILATION;  Surgeon: West Bali, MD;  Location: [facility];  Service: Endoscopy;  Laterality: N/A;  . ESOPHAGOGASTRODUODENOSCOPY N/A 09/03/2014   QHU:TMLYYTKP gastritis/dysphagia due to distal esophagel stricture  . ESOPHAGOGASTRODUODENOSCOPY N/A 09/26/2014   TWS:FKCL non-erosive gastritis/stricture at the gastroesphageal  . KNEE SURGERY      Current Outpatient Medications  Medication Sig Dispense Refill  . diclofenac (VOLTAREN) 75 MG EC tablet TAKE 1 TABLET (75 MG TOTAL) BY MOUTH 2 (TWO) TIMES DAILY WITH A MEAL. 180 tablet 1  . HYDROcodone-acetaminophen (NORCO) 7.5-325 MG tablet Take 1 tablet by mouth every 6 (six) hours as needed for moderate pain (Must last 30 days.). 100 tablet 0  . cyclobenzaprine (FLEXERIL) 10 MG tablet 1 po tid 30 tablet 3  . diphenhydrAMINE (BENADRYL) 25 MG tablet Take 25 mg by mouth every 6 (six) hours as needed for allergies. Reported on 08/21/2015    . omeprazole (PRILOSEC) 20 MG capsule TAKE 1 CAPSULE  BY MOUTH 30 MINUTES PRIOR TO BREAKFAST AND SUPPER 60 capsule 11  . predniSONE (STERAPRED UNI-PAK 21 TAB) 5 MG (21) TBPK tablet Take 6 pills first day; 5 pills second day; 4 pills third day; 3 pills fourth day; 2 pills next day and 1 pill last day. 21 tablet 0  . pseudoephedrine-acetaminophen (TYLENOL SINUS) 30-500 MG TABS tablet Take 1 tablet by mouth every 4 (four) hours as needed.     No current facility-administered medications for this visit.    Allergies as of 09/03/2020  . (No Known Allergies)    Family History  Problem Relation Age of Onset  . Breast cancer Mother        DECEASED  . Alzheimer's disease Father   . Heart disease Brother   . Lupus Sister   . Colon polyps Neg Hx   . Colon cancer Neg Hx   . Stomach cancer Neg Hx     Social History   Socioeconomic History  . Marital status: Single    Spouse name: Not on file  . Number of children: Not on file  . Years of education: Not on file  . Highest education level: Not on file  Occupational History  . Not on file  Tobacco Use  . Smoking status: Never Smoker  . Smokeless tobacco: Never Used  Substance and Sexual Activity  . Alcohol use: Yes    Alcohol/week: 0.0 standard drinks    Comment: very occasional  . Drug use: No  . Sexual activity:  Not Currently    Birth control/protection: Post-menopausal  Other Topics Concern  . Not on file  Social History Narrative  . Not on file   Social Determinants of Health   Financial Resource Strain: Not on file  Food Insecurity: Not on file  Transportation Needs: Not on file  Physical Activity: Not on file  Stress: Not on file  Social Connections: Not on file  Intimate Partner Violence: Not on file    Review of Systems: Gen: Denies any fever, chills, fatigue, weight loss, lack of appetite.  CV: Denies chest pain, heart palpitations, peripheral edema, syncope.  Resp: Denies shortness of breath at rest or with exertion. Denies wheezing or cough.  GI: Denies  dysphagia or odynophagia. Denies jaundice, hematemesis, fecal incontinence. GU : Denies urinary burning, urinary frequency, urinary hesitancy MS: Denies joint pain, muscle weakness, cramps, or limitation of movement.  Derm: Denies rash, itching, dry skin Psych: Denies depression, anxiety, memory loss, and confusion Heme: Denies bruising, bleeding, and enlarged lymph nodes.  Physical Exam: LMP 07/02/2013 Comment: irregular General:   Alert and oriented. Pleasant and cooperative. Well-nourished and well-developed.  Head:  Normocephalic and atraumatic. Eyes:  Without icterus, sclera clear and conjunctiva pink.  Ears:  Normal auditory acuity. Nose:  No deformity, discharge,  or lesions. Mouth:  No deformity or lesions, oral mucosa pink.  Neck:  Supple, without mass or thyromegaly. Lungs:  Clear to auscultation bilaterally. No wheezes, rales, or rhonchi. No distress.  Heart:  S1, S2 present without murmurs appreciated.  Abdomen:  +BS, soft, non-tender and non-distended. No HSM noted. No guarding or rebound. No masses appreciated.  Rectal:  Deferred  Msk:  Symmetrical without gross deformities. Normal posture. Pulses:  Normal pulses noted. Extremities:  Without clubbing or edema. Neurologic:  Alert and  oriented x4;  grossly normal neurologically. Skin:  Intact without significant lesions or rashes. Cervical Nodes:  No significant cervical adenopathy. Psych:  Alert and cooperative. Normal mood and affect.

## 2020-09-03 ENCOUNTER — Encounter: Payer: Self-pay | Admitting: Internal Medicine

## 2020-09-03 ENCOUNTER — Ambulatory Visit: Payer: BC Managed Care – PPO | Admitting: Gastroenterology

## 2020-09-09 ENCOUNTER — Ambulatory Visit: Payer: BC Managed Care – PPO | Admitting: Orthopaedic Surgery

## 2020-09-09 ENCOUNTER — Encounter: Payer: Self-pay | Admitting: Orthopaedic Surgery

## 2020-09-09 ENCOUNTER — Ambulatory Visit: Payer: BC Managed Care – PPO

## 2020-09-09 ENCOUNTER — Other Ambulatory Visit: Payer: Self-pay

## 2020-09-09 VITALS — BP 167/111 | HR 80 | Ht 67.5 in | Wt 152.0 lb

## 2020-09-09 DIAGNOSIS — M545 Low back pain, unspecified: Secondary | ICD-10-CM

## 2020-09-09 DIAGNOSIS — M25571 Pain in right ankle and joints of right foot: Secondary | ICD-10-CM

## 2020-09-09 DIAGNOSIS — G8929 Other chronic pain: Secondary | ICD-10-CM

## 2020-09-09 DIAGNOSIS — M25561 Pain in right knee: Secondary | ICD-10-CM | POA: Diagnosis not present

## 2020-09-09 MED ORDER — DICLOFENAC SODIUM 75 MG PO TBEC: 75.0000 mg | DELAYED_RELEASE_TABLET | Freq: Two times a day (BID) | ORAL | 1 refills | Status: DC

## 2020-09-09 MED ORDER — CYCLOBENZAPRINE HCL 10 MG PO TABS: ORAL_TABLET | ORAL | 3 refills | Status: DC

## 2020-09-09 MED ORDER — HYDROCODONE-ACETAMINOPHEN 7.5-325 MG PO TABS: 1.0000 | ORAL_TABLET | Freq: Four times a day (QID) | ORAL | 0 refills | Status: DC | PRN

## 2020-09-09 NOTE — Progress Notes (Signed)
-Patient JT:[id number] Sandra Davies, female DOB:[birth year], 60 y.o. ESP:[id number]  Chief Complaint  Patient presents with  . Knee Pain    Bilateral/hurting right one is the worst.  . Back Pain    My back is hurting right much  . Ankle Pain    R/hurts and its swollen some.    HPI  Sandra Davies is a 60 y.o. female who is having pain with the right ankle and swelling laterally.  She is working long hours every day of the week.  She does not remember any specific trauma.  She has no redness.  It is not getting any better.  Her back is tender with the weather changes but she has no new trauma, no numbness.  She is taking her medicine.  Her right knee is tender and has swelling and popping but no giving way.  She has no new trauma.  She needs refills of her medicine.   Body mass index is 23.46 kg/m.  ROS  Review of Systems  Constitutional: Positive for activity change.       Patient does not have Diabetes Mellitus. Patient does not have hypertension. Patient does not have COPD or shortness of breath. Patient does not have BMI > 35. Patient does not have current smoking history.   HENT: Negative for congestion.   Respiratory: Negative for cough and shortness of breath.   Cardiovascular: Negative for chest pain.  Gastrointestinal: Positive for constipation.       GERD  Endocrine: Positive for cold intolerance.  Musculoskeletal: Positive for back pain and joint swelling (right knee).  All other systems reviewed and are negative.   All other systems reviewed and are negative.  The following is a summary of the past history medically, past history surgically, known current medicines, social history and family history.  This information is gathered electronically by the computer from prior information and documentation.  I review this each visit and have found including this information at this point in the chart is beneficial and informative.    Past Medical History:   Diagnosis Date  . Arthritis   . Constipation - functional    FOR A LONG TIME  . Dysphagia 2015  . GERD (gastroesophageal reflux disease)     Past Surgical History:  Procedure Laterality Date  . ESOPHAGEAL DILATION N/A 09/03/2014   Procedure: ESOPHAGEAL DILATION;  Surgeon: West Bali, MD;  Location: [facility];  Service: Endoscopy;  Laterality: N/A;  . ESOPHAGOGASTRODUODENOSCOPY N/A 09/03/2014   QJF:HLKTGYBW gastritis/dysphagia due to distal esophagel stricture  . ESOPHAGOGASTRODUODENOSCOPY N/A 09/26/2014   LSL:HTDS non-erosive gastritis/stricture at the gastroesphageal  . KNEE SURGERY      Family History  Problem Relation Age of Onset  . Breast cancer Mother        DECEASED  . Alzheimer's disease Father   . Heart disease Brother   . Lupus Sister   . Colon polyps Neg Hx   . Colon cancer Neg Hx   . Stomach cancer Neg Hx     Social History Social History   Tobacco Use  . Smoking status: Never Smoker  . Smokeless tobacco: Never Used  Substance Use Topics  . Alcohol use: Yes    Alcohol/week: 0.0 standard drinks    Comment: very occasional  . Drug use: No    No Known Allergies  Current Outpatient Medications  Medication Sig Dispense Refill  . diphenhydrAMINE (BENADRYL) 25 MG tablet Take 25 mg by mouth every 6 (six) hours as needed  for allergies. Reported on 08/21/2015    . omeprazole (PRILOSEC) 20 MG capsule TAKE 1 CAPSULE BY MOUTH 30 MINUTES PRIOR TO BREAKFAST AND SUPPER 60 capsule 11  . predniSONE (STERAPRED UNI-PAK 21 TAB) 5 MG (21) TBPK tablet Take 6 pills first day; 5 pills second day; 4 pills third day; 3 pills fourth day; 2 pills next day and 1 pill last day. 21 tablet 0  . pseudoephedrine-acetaminophen (TYLENOL SINUS) 30-500 MG TABS tablet Take 1 tablet by mouth every 4 (four) hours as needed.    . cyclobenzaprine (FLEXERIL) 10 MG tablet 1 po tid 30 tablet 3  . diclofenac (VOLTAREN) 75 MG EC tablet Take 1 tablet (75 mg total) by mouth 2 (two) times daily with  a meal. 180 tablet 1  . HYDROcodone-acetaminophen (NORCO) 7.5-325 MG tablet Take 1 tablet by mouth every 6 (six) hours as needed for moderate pain (Must last 30 days.). 100 tablet 0   No current facility-administered medications for this visit.     Physical Exam  Blood pressure (!) 167/111, pulse 80, height 5' 7.5" (1.715 m), weight 152 lb 0.2 oz (69 kg), last menstrual period 07/02/2013.  Constitutional: overall normal hygiene, normal nutrition, well developed, normal grooming, normal body habitus. Assistive device:none  Musculoskeletal: gait and station Limp right, muscle tone and strength are normal, no tremors or atrophy is present.  .  Neurological: coordination overall normal.  Deep tendon reflex/nerve stretch intact.  Sensation normal.  Cranial nerves II-XII intact.   Skin:   Normal overall no scars, lesions, ulcers or rashes. No psoriasis.  Psychiatric: Alert and oriented x 3.  Recent memory intact, remote memory unclear.  Normal mood and affect. Well groomed.  Good eye contact.  Cardiovascular: overall no swelling, no varicosities, no edema bilaterally, normal temperatures of the legs and arms, no clubbing, cyanosis and good capillary refill.  Lymphatic: palpation is normal.  Right knee with effusion, crepitus, ROM 0 to 110, stable, NV intact.  Right ankle with tenderness, ROM full. Lateral swelling, pain over anterior talofibular ligament.  NV intact.  Spine/Pelvis examination:  Inspection:  Overall, sacoiliac joint benign and hips nontender; without crepitus or defects.   Thoracic spine inspection: Alignment normal without kyphosis present   Lumbar spine inspection:  Alignment  with normal lumbar lordosis, without scoliosis apparent.   Thoracic spine palpation:  without tenderness of spinal processes   Lumbar spine palpation: without tenderness of lumbar area; without tightness of lumbar muscles    Range of Motion:   Lumbar flexion, forward flexion is normal  without pain or tenderness    Lumbar extension is full without pain or tenderness   Left lateral bend is normal without pain or tenderness   Right lateral bend is normal without pain or tenderness   Straight leg raising is normal  Strength & tone: normal   Stability overall normal stability; All other systems reviewed and are negative   The patient has been educated about the nature of the problem(s) and counseled on treatment options.  The patient appeared to understand what I have discussed and is in agreement with it.  Encounter Diagnoses  Name Primary?  . Pain in right ankle and joints of right foot Yes  . Chronic pain of right knee   . Chronic midline low back pain without sciatica    X-rays were done of the right ankle, reported separately.  PLAN Call if any problems.  Precautions discussed.  Continue current medications.   Return to clinic 2 months  I have reviewed the [place] Controlled Substance Reporting System web site prior to prescribing narcotic medicine for this patient  I have given ankle brace and contrast bath instructions.  Electronically Signed Darreld Mclean, MD 3/8/20221:55 PM

## 2020-09-09 NOTE — Progress Notes (Signed)
DG A

## 2020-09-10 ENCOUNTER — Other Ambulatory Visit: Payer: Self-pay | Admitting: Orthopaedic Surgery

## 2020-10-08 ENCOUNTER — Telehealth: Payer: Self-pay | Admitting: Orthopaedic Surgery

## 2020-10-08 MED ORDER — HYDROCODONE-ACETAMINOPHEN 7.5-325 MG PO TABS: 1.0000 | ORAL_TABLET | Freq: Four times a day (QID) | ORAL | 0 refills | Status: DC | PRN

## 2020-11-03 ENCOUNTER — Telehealth: Payer: Self-pay | Admitting: Orthopaedic Surgery

## 2020-11-03 MED ORDER — HYDROCODONE-ACETAMINOPHEN 7.5-325 MG PO TABS: 1.0000 | ORAL_TABLET | Freq: Four times a day (QID) | ORAL | 0 refills | Status: DC | PRN

## 2020-11-03 NOTE — Telephone Encounter (Signed)
  Patient requests Hydrocodone/Acetaminophen 7.5-325 mgs.  Sig: Take 1 tablet by mouth every 6 (six) hours as needed for moderate pain (Must last 30 days.).  Patient uses [facility]   PATIENT IS AWARE THAT SHE IS A FEW DAYS EARLY IN HER REQUEST

## 2020-11-11 ENCOUNTER — Encounter: Payer: Self-pay | Admitting: Orthopaedic Surgery

## 2020-11-11 ENCOUNTER — Ambulatory Visit: Payer: BC Managed Care – PPO | Admitting: Orthopaedic Surgery

## 2020-11-11 ENCOUNTER — Other Ambulatory Visit: Payer: Self-pay

## 2020-11-11 VITALS — Ht 67.5 in | Wt 152.0 lb

## 2020-11-11 DIAGNOSIS — M25561 Pain in right knee: Secondary | ICD-10-CM | POA: Diagnosis not present

## 2020-11-11 DIAGNOSIS — M25571 Pain in right ankle and joints of right foot: Secondary | ICD-10-CM

## 2020-11-11 DIAGNOSIS — G8929 Other chronic pain: Secondary | ICD-10-CM

## 2020-11-11 MED ORDER — CYCLOBENZAPRINE HCL 10 MG PO TABS: ORAL_TABLET | ORAL | 3 refills | Status: DC

## 2020-11-11 NOTE — Progress Notes (Signed)
Patient Sandra Davies:[id number] [name], female DOB:[birth year], 60 y.o. LZJ:[id number]  Chief Complaint  Patient presents with  . Ankle Pain  . Knee Pain    HPI  Sandra Davies is a 60 y.o. female who has chronic right knee and right ankle pain.  She has had more problems with the right ankle.  She has a brace on today. She has lateral swelling. She has no new trauma. She is taking her medicine.  The knee is better.   Body mass index is 23.46 kg/m.  ROS  Review of Systems  Constitutional: Positive for activity change.       Patient does not have Diabetes Mellitus. Patient does not have hypertension. Patient does not have COPD or shortness of breath. Patient does not have BMI > 35. Patient does not have current smoking history.   HENT: Negative for congestion.   Respiratory: Negative for cough and shortness of breath.   Cardiovascular: Negative for chest pain.  Gastrointestinal: Positive for constipation.       GERD  Endocrine: Positive for cold intolerance.  Musculoskeletal: Positive for back pain and joint swelling (right knee).  All other systems reviewed and are negative.   All other systems reviewed and are negative.  The following is a summary of the past history medically, past history surgically, known current medicines, social history and family history.  This information is gathered electronically by the computer from prior information and documentation.  I review this each visit and have found including this information at this point in the chart is beneficial and informative.    Past Medical History:  Diagnosis Date  . Arthritis   . Constipation - functional    FOR A LONG TIME  . Dysphagia 2015  . GERD (gastroesophageal reflux disease)     Past Surgical History:  Procedure Laterality Date  . ESOPHAGEAL DILATION N/A 09/03/2014   Procedure: ESOPHAGEAL DILATION;  Surgeon: West Bali, MD;  [facility]: [facility];  Service: Endoscopy;  Laterality: N/A;  .  ESOPHAGOGASTRODUODENOSCOPY N/A 09/03/2014   KWI:OXBDZHGD gastritis/dysphagia due to distal esophagel stricture  . ESOPHAGOGASTRODUODENOSCOPY N/A 09/26/2014   JME:QAST non-erosive gastritis/stricture at the gastroesphageal  . KNEE SURGERY      Family History  Problem Relation Age of Onset  . Breast cancer Mother        DECEASED  . Alzheimer's disease Father   . Heart disease Brother   . Lupus Sister   . Colon polyps Neg Hx   . Colon cancer Neg Hx   . Stomach cancer Neg Hx     Social History Social History   Tobacco Use  . Smoking status: Never Smoker  . Smokeless tobacco: Never Used  Substance Use Topics  . Alcohol use: Yes    Alcohol/week: 0.0 standard drinks    Comment: very occasional  . Drug use: No    No Known Allergies  Current Outpatient Medications  Medication Sig Dispense Refill  . cyclobenzaprine (FLEXERIL) 10 MG tablet 1 po tid 30 tablet 3  . diclofenac (VOLTAREN) 75 MG EC tablet Take 1 tablet (75 mg total) by mouth 2 (two) times daily with a meal. 180 tablet 1  . diphenhydrAMINE (BENADRYL) 25 MG tablet Take 25 mg by mouth every 6 (six) hours as needed for allergies. Reported on 08/21/2015    . HYDROcodone-acetaminophen (NORCO) 7.5-325 MG tablet Take 1 tablet by mouth every 6 (six) hours as needed for moderate pain (Must last 30 days.). 100 tablet 0  . omeprazole (PRILOSEC)  20 MG capsule TAKE 1 CAPSULE BY MOUTH 30 MINUTES PRIOR TO BREAKFAST AND SUPPER 60 capsule 11  . predniSONE (STERAPRED UNI-PAK 21 TAB) 5 MG (21) TBPK tablet Take 6 pills first day; 5 pills second day; 4 pills third day; 3 pills fourth day; 2 pills next day and 1 pill last day. 21 tablet 0  . pseudoephedrine-acetaminophen (TYLENOL SINUS) 30-500 MG TABS tablet Take 1 tablet by mouth every 4 (four) hours as needed.     No current facility-administered medications for this visit.     Physical Exam  Height 5' 7.5" (1.715 m), weight 152 lb (68.9 kg), last menstrual period  07/02/2013.  Constitutional: overall normal hygiene, normal nutrition, well developed, normal grooming, normal body habitus. Assistive device:none  Musculoskeletal: gait and station Limp right, muscle tone and strength are normal, no tremors or atrophy is present.  .  Neurological: coordination overall normal.  Deep tendon reflex/nerve stretch intact.  Sensation normal.  Cranial nerves II-XII intact.   Skin:   Normal overall no scars, lesions, ulcers or rashes. No psoriasis.  Psychiatric: Alert and oriented x 3.  Recent memory intact, remote memory unclear.  Normal mood and affect. Well groomed.  Good eye contact.  Cardiovascular: overall no swelling, no varicosities, no edema bilaterally, normal temperatures of the legs and arms, no clubbing, cyanosis and good capillary refill.  Lymphatic: palpation is normal.  Right knee has slight effusion and some crepitus. ROM 0 to 110, stable.   Right ankle has lateral swelling, tenderness but ROM is full.  Slight limp to the right.   All other systems reviewed and are negative   The patient has been educated about the nature of the problem(s) and counseled on treatment options.  The patient appeared to understand what I have discussed and is in agreement with it.  Encounter Diagnoses  Name Primary?  . Pain in right ankle and joints of right foot Yes  . Chronic pain of right knee     PLAN Call if any problems.  Precautions discussed.  Continue current medications.   Return to clinic 3 months   I refilled her Flexeril.  Electronically Signed Darreld Mclean, MD 5/10/20221:39 PM

## 2020-12-08 ENCOUNTER — Telehealth: Payer: Self-pay | Admitting: Orthopaedic Surgery

## 2020-12-08 MED ORDER — HYDROCODONE-ACETAMINOPHEN 7.5-325 MG PO TABS: 1.0000 | ORAL_TABLET | Freq: Four times a day (QID) | ORAL | 0 refills | Status: DC | PRN

## 2020-12-31 ENCOUNTER — Telehealth: Payer: Self-pay | Admitting: Orthopaedic Surgery

## 2021-01-01 MED ORDER — HYDROCODONE-ACETAMINOPHEN 7.5-325 MG PO TABS: 1.0000 | ORAL_TABLET | Freq: Four times a day (QID) | ORAL | 0 refills | Status: DC | PRN

## 2021-01-06 DIAGNOSIS — E782 Mixed hyperlipidemia: Secondary | ICD-10-CM | POA: Insufficient documentation

## 2021-01-06 DIAGNOSIS — R7301 Impaired fasting glucose: Secondary | ICD-10-CM | POA: Insufficient documentation

## 2021-02-03 ENCOUNTER — Telehealth: Payer: Self-pay | Admitting: Orthopaedic Surgery

## 2021-02-04 MED ORDER — HYDROCODONE-ACETAMINOPHEN 7.5-325 MG PO TABS: 1.0000 | ORAL_TABLET | Freq: Four times a day (QID) | ORAL | 0 refills | Status: DC | PRN

## 2021-02-17 ENCOUNTER — Ambulatory Visit: Payer: BC Managed Care – PPO | Admitting: Orthopaedic Surgery

## 2021-02-17 ENCOUNTER — Encounter: Payer: Self-pay | Admitting: Orthopaedic Surgery

## 2021-02-17 ENCOUNTER — Other Ambulatory Visit: Payer: Self-pay

## 2021-02-17 VITALS — BP 129/88 | HR 82 | Ht 67.5 in | Wt 150.6 lb

## 2021-02-17 DIAGNOSIS — G8929 Other chronic pain: Secondary | ICD-10-CM | POA: Diagnosis not present

## 2021-02-17 DIAGNOSIS — M25561 Pain in right knee: Secondary | ICD-10-CM

## 2021-02-17 DIAGNOSIS — M25571 Pain in right ankle and joints of right foot: Secondary | ICD-10-CM

## 2021-02-17 DIAGNOSIS — M545 Low back pain, unspecified: Secondary | ICD-10-CM

## 2021-02-17 MED ORDER — CYCLOBENZAPRINE HCL 10 MG PO TABS: ORAL_TABLET | ORAL | 3 refills | Status: DC

## 2021-02-17 NOTE — Progress Notes (Signed)
My ankle is hurting.  She works 12 hours a day for six days a week. She has right lateral ankle pain and swelling. She has no trauma. She is using ice, her medicine and tries to elevate as much as possible.  She has no redness.  Her back and right knee bother her now and then but the ankle is the main problem.  ROM of the right ankle is good, she has a limp to the right, she has lateral swelling of the ankle and pain over the anterior talofibular ligament.  NV intact.  Encounter Diagnoses  Name Primary?   Pain in right ankle and joints of right foot Yes   Chronic pain of right knee    Chronic midline low back pain without sciatica    I will refill her flexeril.    Return in three months.  Call if any problem.  Precautions discussed.  Electronically Signed Darreld Mclean, MD 8/16/20221:52 PM

## 2021-02-17 NOTE — Patient Instructions (Signed)
Use Aspercreme, Biofreeze or Voltaren gel over the counter 2-3 times daily make sure you rub it in well each time you use it.   

## 2021-03-05 ENCOUNTER — Telehealth: Payer: Self-pay | Admitting: Orthopaedic Surgery

## 2021-03-07 ENCOUNTER — Telehealth: Payer: Self-pay | Admitting: Orthopaedic Surgery

## 2021-03-10 MED ORDER — HYDROCODONE-ACETAMINOPHEN 7.5-325 MG PO TABS: 1.0000 | ORAL_TABLET | Freq: Four times a day (QID) | ORAL | 0 refills | Status: DC | PRN

## 2021-03-21 ENCOUNTER — Telehealth: Payer: Self-pay | Admitting: Orthopaedic Surgery

## 2021-04-05 ENCOUNTER — Telehealth: Payer: Self-pay | Admitting: Orthopaedic Surgery

## 2021-04-06 MED ORDER — HYDROCODONE-ACETAMINOPHEN 7.5-325 MG PO TABS: 1.0000 | ORAL_TABLET | Freq: Four times a day (QID) | ORAL | 0 refills | Status: DC | PRN

## 2021-04-16 DIAGNOSIS — E559 Vitamin D deficiency, unspecified: Secondary | ICD-10-CM | POA: Insufficient documentation

## 2021-04-16 DIAGNOSIS — N951 Menopausal and female climacteric states: Secondary | ICD-10-CM | POA: Insufficient documentation

## 2021-04-16 DIAGNOSIS — K219 Gastro-esophageal reflux disease without esophagitis: Secondary | ICD-10-CM | POA: Insufficient documentation

## 2021-04-18 DIAGNOSIS — G4700 Insomnia, unspecified: Secondary | ICD-10-CM | POA: Insufficient documentation

## 2021-04-18 DIAGNOSIS — N951 Menopausal and female climacteric states: Secondary | ICD-10-CM | POA: Diagnosis not present

## 2021-04-18 DIAGNOSIS — R5383 Other fatigue: Secondary | ICD-10-CM | POA: Insufficient documentation

## 2021-04-18 DIAGNOSIS — K219 Gastro-esophageal reflux disease without esophagitis: Secondary | ICD-10-CM | POA: Diagnosis not present

## 2021-04-18 DIAGNOSIS — R7301 Impaired fasting glucose: Secondary | ICD-10-CM | POA: Diagnosis not present

## 2021-04-18 DIAGNOSIS — M545 Low back pain, unspecified: Secondary | ICD-10-CM | POA: Diagnosis not present

## 2021-04-30 ENCOUNTER — Other Ambulatory Visit: Payer: Self-pay | Admitting: Orthopaedic Surgery

## 2021-05-06 ENCOUNTER — Telehealth: Payer: Self-pay

## 2021-05-06 NOTE — Telephone Encounter (Signed)
Hydrocodone-Acetaminophen 7.5/325 mg  Qty 100 Tablets  PATIENT USES [facility]

## 2021-05-07 MED ORDER — HYDROCODONE-ACETAMINOPHEN 7.5-325 MG PO TABS: 1.0000 | ORAL_TABLET | Freq: Four times a day (QID) | ORAL | 0 refills | Status: DC | PRN

## 2021-05-19 ENCOUNTER — Ambulatory Visit: Payer: BC Managed Care – PPO | Admitting: Orthopaedic Surgery

## 2021-05-19 ENCOUNTER — Other Ambulatory Visit: Payer: Self-pay

## 2021-05-19 ENCOUNTER — Encounter: Payer: Self-pay | Admitting: Orthopaedic Surgery

## 2021-05-19 VITALS — BP 161/99 | HR 83 | Ht 67.5 in | Wt 147.5 lb

## 2021-05-19 DIAGNOSIS — M545 Low back pain, unspecified: Secondary | ICD-10-CM

## 2021-05-19 DIAGNOSIS — M25561 Pain in right knee: Secondary | ICD-10-CM

## 2021-05-19 DIAGNOSIS — G8929 Other chronic pain: Secondary | ICD-10-CM | POA: Diagnosis not present

## 2021-05-19 DIAGNOSIS — M25571 Pain in right ankle and joints of right foot: Secondary | ICD-10-CM | POA: Diagnosis not present

## 2021-05-19 MED ORDER — CYCLOBENZAPRINE HCL 10 MG PO TABS: ORAL_TABLET | ORAL | 3 refills | Status: DC

## 2021-05-19 NOTE — Progress Notes (Signed)
My right side hurts.  She has pain in the right knee and right ankle and right side of lower back. She has no new trauma. Her pain is worse with the cooler weather.  She has no weakness. She is taking her medicine.  Right knee has crepitus and ROM 0 to 110, no limp, stable, slight effusion.  NV intact.  Spine/Pelvis examination:  Inspection:  Overall, sacoiliac joint benign and hips nontender; without crepitus or defects.   Thoracic spine inspection: Alignment normal without kyphosis present   Lumbar spine inspection:  Alignment  with normal lumbar lordosis, without scoliosis apparent.   Thoracic spine palpation:  without tenderness of spinal processes   Lumbar spine palpation: without tenderness of lumbar area; without tightness of lumbar muscles    Range of Motion:   Lumbar flexion, forward flexion is normal without pain or tenderness    Lumbar extension is full without pain or tenderness   Left lateral bend is normal without pain or tenderness   Right lateral bend is normal without pain or tenderness   Straight leg raising is normal  Strength & tone: normal   Stability overall normal stability  Right ankle has full ROM.  Encounter Diagnoses  Name Primary?   Pain in right ankle and joints of right foot Yes   Chronic pain of right knee    Chronic midline low back pain without sciatica    I will refill Flexeril.  Return in three months.  Call if any problem.  Precautions discussed.  Electronically Signed Darreld Mclean, MD 11/15/20221:41 PM

## 2021-05-29 ENCOUNTER — Telehealth: Payer: Self-pay | Admitting: Orthopaedic Surgery

## 2021-06-01 MED ORDER — HYDROCODONE-ACETAMINOPHEN 7.5-325 MG PO TABS: 1.0000 | ORAL_TABLET | Freq: Four times a day (QID) | ORAL | 0 refills | Status: DC | PRN

## 2021-07-01 ENCOUNTER — Other Ambulatory Visit: Payer: Self-pay | Admitting: Orthopaedic Surgery

## 2021-07-01 MED ORDER — HYDROCODONE-ACETAMINOPHEN 7.5-325 MG PO TABS: 1.0000 | ORAL_TABLET | Freq: Four times a day (QID) | ORAL | 0 refills | Status: AC | PRN

## 2021-07-09 ENCOUNTER — Telehealth: Payer: Self-pay

## 2021-07-09 NOTE — Telephone Encounter (Signed)
Hydrocodone-Acetaminophen 7.5/325 mg  [facility]

## 2021-07-14 MED ORDER — HYDROCODONE-ACETAMINOPHEN 7.5-325 MG PO TABS: 1.0000 | ORAL_TABLET | Freq: Four times a day (QID) | ORAL | 0 refills | Status: DC | PRN

## 2021-07-16 DIAGNOSIS — R5383 Other fatigue: Secondary | ICD-10-CM | POA: Diagnosis not present

## 2021-07-16 DIAGNOSIS — R7301 Impaired fasting glucose: Secondary | ICD-10-CM | POA: Diagnosis not present

## 2021-08-03 ENCOUNTER — Other Ambulatory Visit: Payer: Self-pay | Admitting: Orthopaedic Surgery

## 2021-08-11 ENCOUNTER — Telehealth: Payer: Self-pay | Admitting: Orthopaedic Surgery

## 2021-08-11 MED ORDER — HYDROCODONE-ACETAMINOPHEN 7.5-325 MG PO TABS: 1.0000 | ORAL_TABLET | Freq: Four times a day (QID) | ORAL | 0 refills | Status: DC | PRN

## 2021-08-18 ENCOUNTER — Other Ambulatory Visit: Payer: Self-pay

## 2021-08-18 ENCOUNTER — Ambulatory Visit: Payer: BC Managed Care – PPO | Admitting: Orthopaedic Surgery

## 2021-08-18 ENCOUNTER — Encounter: Payer: Self-pay | Admitting: Orthopaedic Surgery

## 2021-08-18 VITALS — BP 129/92 | HR 83 | Ht 67.5 in | Wt 146.2 lb

## 2021-08-18 DIAGNOSIS — M25571 Pain in right ankle and joints of right foot: Secondary | ICD-10-CM

## 2021-08-18 DIAGNOSIS — G8929 Other chronic pain: Secondary | ICD-10-CM

## 2021-08-18 DIAGNOSIS — M545 Low back pain, unspecified: Secondary | ICD-10-CM | POA: Diagnosis not present

## 2021-08-18 DIAGNOSIS — M25561 Pain in right knee: Secondary | ICD-10-CM | POA: Diagnosis not present

## 2021-08-18 MED ORDER — CYCLOBENZAPRINE HCL 10 MG PO TABS: ORAL_TABLET | ORAL | 3 refills | Status: DC

## 2021-08-18 NOTE — Progress Notes (Signed)
I hurt all over at times.  She is working 12 hour shifts six days a week.  She has pain of the right knee and lower back and sometimes the right hip, right shoulder and right ankle.  The lower back gives her most of her pain.  She has no weakness.  She is doing her exercises and taking her medicine.  Spine/Pelvis examination:  Inspection:  Overall, sacoiliac joint benign and hips nontender; without crepitus or defects.   Thoracic spine inspection: Alignment normal without kyphosis present   Lumbar spine inspection:  Alignment  with normal lumbar lordosis, without scoliosis apparent.   Thoracic spine palpation:  without tenderness of spinal processes   Lumbar spine palpation: without tenderness of lumbar area; without tightness of lumbar muscles    Range of Motion:   Lumbar flexion, forward flexion is normal without pain or tenderness    Lumbar extension is full without pain or tenderness   Left lateral bend is normal without pain or tenderness   Right lateral bend is normal without pain or tenderness   Straight leg raising is normal  Strength & tone: normal   Stability overall normal stability  Right knee has crepitus, slight effusion but ROM is 0 to 115, no limp today.  Encounter Diagnoses  Name Primary?   Chronic pain of right knee Yes   Chronic midline low back pain without sciatica    Pain in right ankle and joints of right foot    I will refill her Flexeril.  Return in three months.  Call if any problem.  Precautions discussed.  Continue exercises.  Electronically Signed Sanjuana Kava, MD 2/14/20231:52 PM

## 2021-08-22 DIAGNOSIS — Z0001 Encounter for general adult medical examination with abnormal findings: Secondary | ICD-10-CM | POA: Diagnosis not present

## 2021-08-24 ENCOUNTER — Other Ambulatory Visit (HOSPITAL_COMMUNITY): Payer: Self-pay | Admitting: Family Medicine

## 2021-08-24 DIAGNOSIS — Z13820 Encounter for screening for osteoporosis: Secondary | ICD-10-CM

## 2021-09-03 ENCOUNTER — Other Ambulatory Visit: Payer: Self-pay | Admitting: Orthopaedic Surgery

## 2021-09-07 ENCOUNTER — Telehealth: Payer: Self-pay | Admitting: Orthopaedic Surgery

## 2021-09-08 MED ORDER — HYDROCODONE-ACETAMINOPHEN 7.5-325 MG PO TABS: 1.0000 | ORAL_TABLET | Freq: Four times a day (QID) | ORAL | 0 refills | Status: DC | PRN

## 2021-10-06 ENCOUNTER — Telehealth: Payer: Self-pay

## 2021-10-06 MED ORDER — HYDROCODONE-ACETAMINOPHEN 7.5-325 MG PO TABS: 1.0000 | ORAL_TABLET | Freq: Four times a day (QID) | ORAL | 0 refills | Status: DC | PRN

## 2021-10-06 NOTE — Telephone Encounter (Signed)
Hydrocodone-Acetaminophen 7.5/325 mg  Qty 100 Tablets  [facility] 

## 2021-11-09 ENCOUNTER — Telehealth: Payer: Self-pay | Admitting: Orthopaedic Surgery

## 2021-11-09 MED ORDER — HYDROCODONE-ACETAMINOPHEN 7.5-325 MG PO TABS: 1.0000 | ORAL_TABLET | Freq: Four times a day (QID) | ORAL | 0 refills | Status: DC | PRN

## 2021-11-10 ENCOUNTER — Ambulatory Visit: Payer: BC Managed Care – PPO | Admitting: Orthopaedic Surgery

## 2021-11-10 ENCOUNTER — Encounter: Payer: Self-pay | Admitting: Orthopaedic Surgery

## 2021-11-10 VITALS — Ht 67.5 in | Wt 146.0 lb

## 2021-11-10 DIAGNOSIS — M25561 Pain in right knee: Secondary | ICD-10-CM

## 2021-11-10 DIAGNOSIS — G8929 Other chronic pain: Secondary | ICD-10-CM

## 2021-11-10 DIAGNOSIS — M545 Low back pain, unspecified: Secondary | ICD-10-CM | POA: Diagnosis not present

## 2021-11-10 MED ORDER — CYCLOBENZAPRINE HCL 10 MG PO TABS
ORAL_TABLET | ORAL | 3 refills | Status: DC
Start: 2021-11-10 — End: 2022-05-04

## 2021-11-10 MED ORDER — DICLOFENAC SODIUM 75 MG PO TBEC
75.0000 mg | DELAYED_RELEASE_TABLET | Freq: Two times a day (BID) | ORAL | 1 refills | Status: DC
Start: 2021-11-10 — End: 2022-02-09

## 2021-11-10 NOTE — Progress Notes (Signed)
My back is worse. ? ?She works 12 hours at a shift six to seven days a week.  They have changed her to a more strenuous work station.  She has more pain at the end of the shift.  She is taking her medicine.  I will refill the diclofenac and flexeril. ? ?Spine/Pelvis examination: ? Inspection:  Overall, sacoiliac joint benign and hips nontender; without crepitus or defects. ? ? Thoracic spine inspection: Alignment normal without kyphosis present ? ? Lumbar spine inspection:  Alignment  with normal lumbar lordosis, without scoliosis apparent. ? ? Thoracic spine palpation:  without tenderness of spinal processes ? ? Lumbar spine palpation: without tenderness of lumbar area; without tightness of lumbar muscles  ? ? Range of Motion: ?  Lumbar flexion, forward flexion is normal without pain or tenderness  ?  Lumbar extension is full without pain or tenderness ?  Left lateral bend is normal without pain or tenderness ?  Right lateral bend is normal without pain or tenderness ?  Straight leg raising is normal ? Strength & tone: normal ? ? Stability overall normal stability ? ?Encounter Diagnoses  ?Name Primary?  ? Chronic pain of right knee Yes  ? Chronic midline low back pain without sciatica   ? ?Return in three months. ? ?Call if any problem. ? ?Precautions discussed. ? ?Electronically Signed ?Darreld Mclean, MD ?5/9/20231:40 PM ? ?

## 2021-11-17 ENCOUNTER — Ambulatory Visit: Payer: BC Managed Care – PPO | Admitting: Orthopaedic Surgery

## 2021-12-10 ENCOUNTER — Telehealth: Payer: Self-pay | Admitting: Orthopaedic Surgery

## 2021-12-15 MED ORDER — HYDROCODONE-ACETAMINOPHEN 7.5-325 MG PO TABS: 1.0000 | ORAL_TABLET | Freq: Four times a day (QID) | ORAL | 0 refills | Status: DC | PRN

## 2022-01-11 ENCOUNTER — Telehealth: Payer: Self-pay | Admitting: Orthopaedic Surgery

## 2022-01-12 MED ORDER — HYDROCODONE-ACETAMINOPHEN 7.5-325 MG PO TABS: 1.0000 | ORAL_TABLET | Freq: Four times a day (QID) | ORAL | 0 refills | Status: DC | PRN

## 2022-02-09 ENCOUNTER — Ambulatory Visit (INDEPENDENT_AMBULATORY_CARE_PROVIDER_SITE_OTHER): Payer: BC Managed Care – PPO

## 2022-02-09 ENCOUNTER — Ambulatory Visit: Payer: BC Managed Care – PPO | Admitting: Orthopaedic Surgery

## 2022-02-09 ENCOUNTER — Encounter: Payer: Self-pay | Admitting: Orthopaedic Surgery

## 2022-02-09 DIAGNOSIS — M25552 Pain in left hip: Secondary | ICD-10-CM | POA: Diagnosis not present

## 2022-02-09 DIAGNOSIS — M7062 Trochanteric bursitis, left hip: Secondary | ICD-10-CM

## 2022-02-09 MED ORDER — HYDROCODONE-ACETAMINOPHEN 7.5-325 MG PO TABS: 1.0000 | ORAL_TABLET | Freq: Four times a day (QID) | ORAL | 0 refills | Status: DC | PRN

## 2022-02-09 MED ORDER — DICLOFENAC SODIUM 75 MG PO TBEC: 75.0000 mg | DELAYED_RELEASE_TABLET | Freq: Two times a day (BID) | ORAL | 1 refills | Status: DC

## 2022-02-09 NOTE — Patient Instructions (Signed)
OOW note/ Return to work 02/15/22

## 2022-02-09 NOTE — Progress Notes (Signed)
I fell.  She fell on January 23, 2022 and hurt her left hip, left arm and shoulder and left side of her neck.  Her left hip laterally is hurting more.  She has tried heat, ice, rest and it still hurts.  She works every day, seven days a week.  She is concerned she is still hurting.  Left hip has full ROM but pain more laterally over trochanteric bursa area.  She has slight limp.  NV intact.  Left upper arm has contusion laterally.  ROM of neck is full. Grips are normal.  Encounter Diagnoses  Name Primary?   Pain in left hip Yes   Trochanteric bursitis, left hip    X-rays were done of the left hip, reported separately.  PROCEDURE NOTE:  The patient request injection, verbal consent was obtained.  The left trochanteric area of the hip was prepped appropriately after time out was performed.   Sterile technique was observed and injection of 1 cc of DepoMedrol 40 mg with several cc's of plain xylocaine. Anesthesia was provided by ethyl chloride and a 20-gauge needle was used to inject the hip area. The injection was tolerated well.  A band aid dressing was applied.  The patient was advised to apply ice later today and tomorrow to the injection sight as needed.  I have refilled her pain medicine.  Return in three weeks.  Stay out of work until 8-14.  Call if any problem.  Precautions discussed.  Electronically Signed Darreld Mclean, MD 8/8/20232:31 PM

## 2022-03-02 ENCOUNTER — Ambulatory Visit: Payer: BC Managed Care – PPO | Admitting: Orthopaedic Surgery

## 2022-03-02 ENCOUNTER — Encounter: Payer: Self-pay | Admitting: Orthopaedic Surgery

## 2022-03-02 VITALS — Ht 67.5 in | Wt 150.0 lb

## 2022-03-02 DIAGNOSIS — M7062 Trochanteric bursitis, left hip: Secondary | ICD-10-CM | POA: Diagnosis not present

## 2022-03-02 DIAGNOSIS — M25552 Pain in left hip: Secondary | ICD-10-CM

## 2022-03-02 MED ORDER — PREDNISONE 5 MG (21) PO TBPK: ORAL_TABLET | ORAL | 0 refills | Status: DC

## 2022-03-02 NOTE — Progress Notes (Signed)
My hip is hurting again.  The injection helped the left hip area but she works every day of the week, most days 12 hours and she says she really has not had time to relax any.  She has no new trauma.  She is taking the diclofenac.  I will give prednisone dose pack, stop the diclofenac when on the prednisone.  Left hip is tender over the trochanteric area but has full ROM and no limp.  NV intact.  Encounter Diagnoses  Name Primary?   Pain in left hip Yes   Trochanteric bursitis, left hip    Return in six weeks.  Call if any problem.  Precautions discussed.  Electronically Signed Darreld Mclean, MD 8/29/20232:36 PM

## 2022-03-16 ENCOUNTER — Telehealth: Payer: Self-pay | Admitting: Orthopaedic Surgery

## 2022-03-17 MED ORDER — HYDROCODONE-ACETAMINOPHEN 7.5-325 MG PO TABS: 1.0000 | ORAL_TABLET | Freq: Four times a day (QID) | ORAL | 0 refills | Status: DC | PRN

## 2022-03-26 NOTE — Telephone Encounter (Signed)
HYDROcodone-acetaminophen (NORCO) 7.5-325 MG Re-Request - patient did not yet get this medication.  [facility] advised patient to call back to Dr Luna Glasgow

## 2022-03-30 MED ORDER — HYDROCODONE-ACETAMINOPHEN 7.5-325 MG PO TABS: 1.0000 | ORAL_TABLET | Freq: Four times a day (QID) | ORAL | 0 refills | Status: DC | PRN

## 2022-04-06 ENCOUNTER — Ambulatory Visit: Payer: BC Managed Care – PPO | Admitting: Orthopaedic Surgery

## 2022-04-13 ENCOUNTER — Ambulatory Visit: Payer: BC Managed Care – PPO | Admitting: Orthopaedic Surgery

## 2022-04-18 ENCOUNTER — Telehealth: Payer: Self-pay | Admitting: Orthopaedic Surgery

## 2022-04-19 MED ORDER — HYDROCODONE-ACETAMINOPHEN 7.5-325 MG PO TABS: 1.0000 | ORAL_TABLET | Freq: Four times a day (QID) | ORAL | 0 refills | Status: DC | PRN

## 2022-04-27 ENCOUNTER — Telehealth: Payer: Self-pay | Admitting: Radiology

## 2022-04-27 ENCOUNTER — Ambulatory Visit: Payer: BC Managed Care – PPO | Admitting: Orthopaedic Surgery

## 2022-04-27 NOTE — Telephone Encounter (Signed)
Done

## 2022-04-27 NOTE — Telephone Encounter (Signed)
Patient is scheduled with Dr Amedeo Kinsman but should be seeing Dr Luna Glasgow I called her to see if she can come this morning instead of this afternoon and see Dr Luna Glasgow. No answer home  mobile voice mail not set up. No answer at work number  If she comes in she will need to be RS to see Dr Luna Glasgow, if she returns my call we should ask her to come in now to see Dr Luna Glasgow

## 2022-05-04 ENCOUNTER — Encounter: Payer: Self-pay | Admitting: Orthopaedic Surgery

## 2022-05-04 ENCOUNTER — Ambulatory Visit (INDEPENDENT_AMBULATORY_CARE_PROVIDER_SITE_OTHER): Payer: BC Managed Care – PPO

## 2022-05-04 ENCOUNTER — Ambulatory Visit: Payer: BC Managed Care – PPO | Admitting: Orthopaedic Surgery

## 2022-05-04 VITALS — Ht 67.0 in | Wt 145.0 lb

## 2022-05-04 DIAGNOSIS — G8929 Other chronic pain: Secondary | ICD-10-CM | POA: Diagnosis not present

## 2022-05-04 DIAGNOSIS — M25561 Pain in right knee: Secondary | ICD-10-CM | POA: Diagnosis not present

## 2022-05-04 DIAGNOSIS — M545 Low back pain, unspecified: Secondary | ICD-10-CM

## 2022-05-04 MED ORDER — HYDROCODONE-ACETAMINOPHEN 7.5-325 MG PO TABS: 1.0000 | ORAL_TABLET | Freq: Four times a day (QID) | ORAL | 0 refills | Status: DC | PRN

## 2022-05-04 MED ORDER — CYCLOBENZAPRINE HCL 10 MG PO TABS: ORAL_TABLET | ORAL | 3 refills | Status: DC

## 2022-05-04 NOTE — Progress Notes (Signed)
I fell.  She fell several days ago at home and hurt her right knee and lower back.  The left knee has some pain but the right knee hurts worse.  She had no other injury.  She took off work yesterday and today because of the pain.  She is on pain medicine which helps some.  She has swelling and popping of the right knee.  Right knee has crepitus, slight effusion and pain medially.  Stable but medial joint pain.  X-rays were done of the right knee, reported separately.  Encounter Diagnoses  Name Primary?   Chronic pain of right knee Yes   Chronic midline low back pain without sciatica    PROCEDURE NOTE:  The patient requests injections of the right knee , verbal consent was obtained.  The right knee was prepped appropriately after time out was performed.   Sterile technique was observed and injection of 1 cc of DepoMedrol 40mg  with several cc's of plain xylocaine. Anesthesia was provided by ethyl chloride and a 20-gauge needle was used to inject the knee area. The injection was tolerated well.  A band aid dressing was applied.  The patient was advised to apply ice later today and tomorrow to the injection sight as needed.  I have reviewed the [place] web site prior to prescribing narcotic medicine for this patient.  I will refill the Flexeril.  Stay out of work rest of week.  Return in two weeks.  Call if any problem.  Precautions discussed.  Electronically Signed Sanjuana Kava, MD 10/31/20232:03 PM

## 2022-05-04 NOTE — Patient Instructions (Signed)
OOW note for rest of this week.

## 2022-05-11 ENCOUNTER — Encounter: Payer: Self-pay | Admitting: Radiology

## 2022-05-11 ENCOUNTER — Telehealth: Payer: Self-pay | Admitting: Orthopaedic Surgery

## 2022-05-11 NOTE — Telephone Encounter (Signed)
Patient called, stated that she was supposed to go back to work yesterday, 05/10/22, but she is not able to.  She is requesting a note to return to work on 05/17/22.

## 2022-05-14 ENCOUNTER — Encounter: Payer: Self-pay | Admitting: Orthopaedic Surgery

## 2022-05-18 ENCOUNTER — Encounter: Payer: Self-pay | Admitting: Orthopaedic Surgery

## 2022-05-18 ENCOUNTER — Ambulatory Visit: Payer: BC Managed Care – PPO | Admitting: Orthopaedic Surgery

## 2022-05-18 DIAGNOSIS — G8929 Other chronic pain: Secondary | ICD-10-CM | POA: Diagnosis not present

## 2022-05-18 DIAGNOSIS — M25561 Pain in right knee: Secondary | ICD-10-CM | POA: Diagnosis not present

## 2022-05-18 MED ORDER — HYDROCODONE-ACETAMINOPHEN 7.5-325 MG PO TABS: 1.0000 | ORAL_TABLET | Freq: Four times a day (QID) | ORAL | 0 refills | Status: DC | PRN

## 2022-05-18 NOTE — Progress Notes (Signed)
My knee is hurting.  She went back to work.  Her right knee is still hurting but not as much.  I have given her a knee sleeve today. She has no new trauma. She is taking her medicine.  She has no giving way but has swelling and crepitus.  Right knee has ROM 0 to 110, medial pain, stable, slight effusion, crepitus, NV intact.  No distal edema.  Encounter Diagnosis  Name Primary?   Chronic pain of right knee Yes   I will refill her pain medicine.  Return in three weeks.  Call if any problem.  Precautions discussed.  Electronically Signed Darreld Mclean, MD 11/14/20231:43 PM

## 2022-06-08 ENCOUNTER — Ambulatory Visit (INDEPENDENT_AMBULATORY_CARE_PROVIDER_SITE_OTHER): Payer: BC Managed Care – PPO | Admitting: Orthopaedic Surgery

## 2022-06-08 ENCOUNTER — Encounter: Payer: Self-pay | Admitting: Orthopaedic Surgery

## 2022-06-08 VITALS — BP 134/86 | HR 82 | Ht 67.5 in | Wt 145.0 lb

## 2022-06-08 DIAGNOSIS — M545 Low back pain, unspecified: Secondary | ICD-10-CM

## 2022-06-08 DIAGNOSIS — M25561 Pain in right knee: Secondary | ICD-10-CM

## 2022-06-08 DIAGNOSIS — G8929 Other chronic pain: Secondary | ICD-10-CM | POA: Diagnosis not present

## 2022-06-08 NOTE — Progress Notes (Signed)
I feel better.  She has less pain of the right knee and her back.  She is working full time.  Right knee has ROM 0 to 110, crepitus and slight effusion, stable.  NV intact.  Spine/Pelvis examination:  Inspection:  Overall, sacoiliac joint benign and hips nontender; without crepitus or defects.   Thoracic spine inspection: Alignment normal without kyphosis present   Lumbar spine inspection:  Alignment  with normal lumbar lordosis, without scoliosis apparent.   Thoracic spine palpation:  without tenderness of spinal processes   Lumbar spine palpation: without tenderness of lumbar area; without tightness of lumbar muscles    Range of Motion:   Lumbar flexion, forward flexion is normal without pain or tenderness    Lumbar extension is full without pain or tenderness   Left lateral bend is normal without pain or tenderness   Right lateral bend is normal without pain or tenderness   Straight leg raising is normal  Strength & tone: normal   Stability overall normal stability  Encounter Diagnoses  Name Primary?   Chronic pain of right knee Yes   Chronic midline low back pain without sciatica    Return in six weeks.  Call if any problem.  Precautions discussed.  Electronically Signed Darreld Mclean, MD 12/5/20231:38 PM

## 2022-06-25 ENCOUNTER — Other Ambulatory Visit: Payer: Self-pay | Admitting: Orthopaedic Surgery

## 2022-06-25 NOTE — Telephone Encounter (Signed)
Patient called requesting a refill on her Hydrocodone 7.5-325 to be sent to [facility].  Pt's # [phone]

## 2022-06-29 NOTE — Telephone Encounter (Signed)
Request sent to provider.

## 2022-06-30 MED ORDER — HYDROCODONE-ACETAMINOPHEN 7.5-325 MG PO TABS: 1.0000 | ORAL_TABLET | Freq: Four times a day (QID) | ORAL | 0 refills | Status: DC | PRN

## 2022-07-06 ENCOUNTER — Telehealth: Payer: Self-pay

## 2022-07-06 NOTE — Telephone Encounter (Signed)
Hydrocodone-Acetaminophen 7.5/325 MG Qty 100 Tablets  PATIENT USES [facility]

## 2022-07-07 MED ORDER — HYDROCODONE-ACETAMINOPHEN 7.5-325 MG PO TABS: 1.0000 | ORAL_TABLET | Freq: Four times a day (QID) | ORAL | 0 refills | Status: DC | PRN

## 2022-07-20 ENCOUNTER — Ambulatory Visit: Payer: BC Managed Care – PPO | Admitting: Orthopaedic Surgery

## 2022-07-27 ENCOUNTER — Encounter: Payer: Self-pay | Admitting: Orthopaedic Surgery

## 2022-07-27 ENCOUNTER — Encounter (HOSPITAL_COMMUNITY): Payer: Self-pay

## 2022-07-27 ENCOUNTER — Other Ambulatory Visit: Payer: Self-pay

## 2022-07-27 ENCOUNTER — Ambulatory Visit: Payer: BC Managed Care – PPO | Admitting: Orthopaedic Surgery

## 2022-07-27 ENCOUNTER — Emergency Department (HOSPITAL_COMMUNITY)

## 2022-07-27 VITALS — BP 144/93 | HR 89 | Ht 67.5 in | Wt 144.0 lb

## 2022-07-27 DIAGNOSIS — R030 Elevated blood-pressure reading, without diagnosis of hypertension: Secondary | ICD-10-CM | POA: Insufficient documentation

## 2022-07-27 DIAGNOSIS — G8929 Other chronic pain: Secondary | ICD-10-CM

## 2022-07-27 DIAGNOSIS — Y990 Civilian activity done for income or pay: Secondary | ICD-10-CM | POA: Diagnosis not present

## 2022-07-27 DIAGNOSIS — S9031XA Contusion of right foot, initial encounter: Secondary | ICD-10-CM | POA: Diagnosis not present

## 2022-07-27 DIAGNOSIS — X58XXXA Exposure to other specified factors, initial encounter: Secondary | ICD-10-CM | POA: Insufficient documentation

## 2022-07-27 DIAGNOSIS — M25561 Pain in right knee: Secondary | ICD-10-CM

## 2022-07-27 DIAGNOSIS — S93401A Sprain of unspecified ligament of right ankle, initial encounter: Secondary | ICD-10-CM | POA: Diagnosis not present

## 2022-07-27 DIAGNOSIS — S99921A Unspecified injury of right foot, initial encounter: Secondary | ICD-10-CM | POA: Diagnosis present

## 2022-07-27 MED ORDER — DICLOFENAC SODIUM 75 MG PO TBEC: 75.0000 mg | DELAYED_RELEASE_TABLET | Freq: Two times a day (BID) | ORAL | 1 refills | Status: DC
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
## 2022-07-27 NOTE — Progress Notes (Signed)
I am working all the time.  She works 12 hours a day every day with no breaks.  She has right knee pain that is chronic. She has some swelling, no giving way.  Right knee has ROM 0 to 110, stable, slight effusion, crepitus, NV intact.  Encounter Diagnosis  Name Primary?   Chronic pain of right knee Yes   I will refill diclofenac.  Return in three months.  Call if any problem.  Precautions discussed.  Electronically Signed Sanjuana Kava, MD 1/23/20242:13 PM

## 2022-07-28 ENCOUNTER — Emergency Department (HOSPITAL_COMMUNITY): Payer: BC Managed Care – PPO

## 2022-07-28 ENCOUNTER — Emergency Department (HOSPITAL_COMMUNITY)
Admission: EM | Admit: 2022-07-28 | Discharge: 2022-07-28 | Disposition: A | Discharge status: Home / Self Care | Attending: Emergency Medicine | Admitting: Emergency Medicine

## 2022-07-28 DIAGNOSIS — S9031XA Contusion of right foot, initial encounter: Secondary | ICD-10-CM

## 2022-07-28 DIAGNOSIS — S93401A Sprain of unspecified ligament of right ankle, initial encounter: Secondary | ICD-10-CM

## 2022-07-28 DIAGNOSIS — R030 Elevated blood-pressure reading, without diagnosis of hypertension: Secondary | ICD-10-CM

## 2022-07-28 MED ORDER — ACETAMINOPHEN 325 MG PO TABS
650.0000 mg | ORAL_TABLET | Freq: Once | ORAL | Status: AC
  Administered 2022-07-28: 650 mg via ORAL
  Filled 2022-07-28: qty 2

## 2022-07-28 MED ORDER — NAPROXEN 500 MG PO TABS: 500.0000 mg | ORAL_TABLET | Freq: Two times a day (BID) | ORAL | 0 refills | Status: DC

## 2022-07-28 MED ORDER — IBUPROFEN 400 MG PO TABS
400.0000 mg | ORAL_TABLET | Freq: Once | ORAL | Status: AC
  Administered 2022-07-28: 400 mg via ORAL
  Filled 2022-07-28: qty 1

## 2022-07-28 NOTE — Discharge Instructions (Addendum)
Apply ice for 30 minutes at a time, 4 times a day.  Keep your foot elevated is much as possible.  Dr. Luna Glasgow gave you a prescription for diclofenac.  That should help with your pain in your foot and ankle.  You may add acetaminophen as needed to get additional pain relief.  Combining acetaminophen with diclofenac gives you better pain relief than either medication by itself.
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
## 2022-08-03 ENCOUNTER — Telehealth: Payer: Self-pay | Admitting: Orthopaedic Surgery

## 2022-08-09 MED ORDER — HYDROCODONE-ACETAMINOPHEN 7.5-325 MG PO TABS: 1.0000 | ORAL_TABLET | Freq: Four times a day (QID) | ORAL | 0 refills | Status: DC | PRN

## 2022-08-09 MED ORDER — CYCLOBENZAPRINE HCL 10 MG PO TABS: ORAL_TABLET | ORAL | 3 refills | Status: DC

## 2022-08-09 NOTE — Telephone Encounter (Signed)
Hydrocodone-Acetaminophen 7.5/325 MG Qty 28 Tablets  PATIENT USES [facility]

## 2022-08-30 ENCOUNTER — Telehealth: Payer: Self-pay | Admitting: Orthopaedic Surgery

## 2022-08-30 MED ORDER — HYDROCODONE-ACETAMINOPHEN 7.5-325 MG PO TABS: 1.0000 | ORAL_TABLET | Freq: Four times a day (QID) | ORAL | 0 refills | Status: DC | PRN

## 2022-08-30 NOTE — Telephone Encounter (Signed)
Sent to provider 

## 2022-08-30 NOTE — Telephone Encounter (Signed)
Patient's spouse presented to the office requesting a refill for the patient for Hydrocodone 7.5-325 to be sent to [facility].

## 2022-09-02 ENCOUNTER — Encounter: Payer: Self-pay | Admitting: Radiology

## 2022-09-28 ENCOUNTER — Telehealth: Payer: Self-pay | Admitting: Orthopaedic Surgery

## 2022-09-28 DIAGNOSIS — M19079 Primary osteoarthritis, unspecified ankle and foot: Secondary | ICD-10-CM | POA: Insufficient documentation

## 2022-09-28 MED ORDER — HYDROCODONE-ACETAMINOPHEN 7.5-325 MG PO TABS: 1.0000 | ORAL_TABLET | Freq: Four times a day (QID) | ORAL | 0 refills | Status: DC | PRN

## 2022-09-28 NOTE — Telephone Encounter (Signed)
Dr. Brooke Bonito patient - Sandra Davies presented to the office for the patient, requesting a refill on Hydrocodone 7.5-325 to be sent to [facility].

## 2022-10-15 DIAGNOSIS — M19079 Primary osteoarthritis, unspecified ankle and foot: Secondary | ICD-10-CM | POA: Insufficient documentation

## 2022-10-26 ENCOUNTER — Ambulatory Visit: Payer: BC Managed Care – PPO | Admitting: Orthopaedic Surgery

## 2022-10-26 ENCOUNTER — Encounter: Payer: Self-pay | Admitting: Orthopaedic Surgery

## 2022-10-26 VITALS — BP 181/108 | HR 83

## 2022-10-26 DIAGNOSIS — M545 Low back pain, unspecified: Secondary | ICD-10-CM

## 2022-10-26 DIAGNOSIS — G8929 Other chronic pain: Secondary | ICD-10-CM | POA: Diagnosis not present

## 2022-10-26 DIAGNOSIS — M25561 Pain in right knee: Secondary | ICD-10-CM

## 2022-10-26 MED ORDER — HYDROCODONE-ACETAMINOPHEN 7.5-325 MG PO TABS: 1.0000 | ORAL_TABLET | Freq: Four times a day (QID) | ORAL | 0 refills | Status: DC | PRN
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
## 2022-11-22 ENCOUNTER — Telehealth: Payer: Self-pay | Admitting: Orthopaedic Surgery

## 2022-11-22 MED ORDER — HYDROCODONE-ACETAMINOPHEN 7.5-325 MG PO TABS: 1.0000 | ORAL_TABLET | Freq: Four times a day (QID) | ORAL | 0 refills | Status: DC | PRN

## 2022-11-22 NOTE — Telephone Encounter (Signed)
Dr. Sanjuan Dame pt - patient is requesting a refill on Hydrocodone 7.5-325 to be sent to [facility].

## 2022-12-21 ENCOUNTER — Telehealth: Payer: Self-pay | Admitting: Orthopaedic Surgery

## 2022-12-21 MED ORDER — HYDROCODONE-ACETAMINOPHEN 7.5-325 MG PO TABS: 1.0000 | ORAL_TABLET | Freq: Four times a day (QID) | ORAL | 0 refills | Status: DC | PRN

## 2022-12-21 NOTE — Telephone Encounter (Signed)
Dr. Sanjuan Dame pt - someone presented to the office for the patient and stated the patient would like a refill on Hydrocodone 7.5-325 to be sent to [facility]

## 2022-12-27 DIAGNOSIS — M19079 Primary osteoarthritis, unspecified ankle and foot: Secondary | ICD-10-CM | POA: Insufficient documentation

## 2023-01-24 ENCOUNTER — Telehealth: Payer: Self-pay | Admitting: Orthopaedic Surgery

## 2023-01-24 MED ORDER — HYDROCODONE-ACETAMINOPHEN 7.5-325 MG PO TABS: 1.0000 | ORAL_TABLET | Freq: Four times a day (QID) | ORAL | 0 refills | Status: DC | PRN

## 2023-01-24 NOTE — Telephone Encounter (Signed)
Dr. Sanjuan Dame pt - spoke w/the patient, she is requesting a refill on Hydrocodone to be sent to [facility]

## 2023-01-25 ENCOUNTER — Ambulatory Visit: Payer: BC Managed Care – PPO | Admitting: Orthopaedic Surgery

## 2023-02-01 ENCOUNTER — Ambulatory Visit: Payer: BC Managed Care – PPO | Admitting: Orthopaedic Surgery

## 2023-02-01 ENCOUNTER — Telehealth: Payer: Self-pay | Admitting: Orthopaedic Surgery

## 2023-02-01 NOTE — Telephone Encounter (Signed)
Patient called and left voicemail stating she can't come to her appt today and to call her back.  I return call and patient voicemail not set up.

## 2023-02-09 ENCOUNTER — Other Ambulatory Visit (INDEPENDENT_AMBULATORY_CARE_PROVIDER_SITE_OTHER): Payer: BC Managed Care – PPO

## 2023-02-09 ENCOUNTER — Encounter: Payer: Self-pay | Admitting: Orthopaedic Surgery

## 2023-02-09 ENCOUNTER — Ambulatory Visit: Payer: BC Managed Care – PPO | Admitting: Orthopaedic Surgery

## 2023-02-09 VITALS — BP 171/100 | HR 93

## 2023-02-09 DIAGNOSIS — M545 Low back pain, unspecified: Secondary | ICD-10-CM

## 2023-02-09 DIAGNOSIS — M25561 Pain in right knee: Secondary | ICD-10-CM

## 2023-02-09 DIAGNOSIS — M25512 Pain in left shoulder: Secondary | ICD-10-CM

## 2023-02-09 DIAGNOSIS — G8929 Other chronic pain: Secondary | ICD-10-CM

## 2023-02-09 MED ORDER — HYDROCODONE-ACETAMINOPHEN 7.5-325 MG PO TABS: 1.0000 | ORAL_TABLET | Freq: Four times a day (QID) | ORAL | 0 refills | Status: DC | PRN

## 2023-02-09 NOTE — Patient Instructions (Signed)
OUT OF WORK ?
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
## 2023-02-28 ENCOUNTER — Telehealth: Payer: Self-pay | Admitting: Orthopaedic Surgery

## 2023-02-28 NOTE — Telephone Encounter (Signed)
Dr. Sanjuan Dame pt - spoke w/the pt, she rescheduled her appt for 8/27 to 9/03, she has a stomach bug.  She would like a refill on her Hydrdocodone 7.5-325 to be sent to [facility].  She is also requesting her OOW note be extended to 9/03.  She would like it faxed to Charolotte Capuchin at [phone]

## 2023-03-01 ENCOUNTER — Ambulatory Visit: Payer: BC Managed Care – PPO | Admitting: Orthopaedic Surgery

## 2023-03-01 MED ORDER — HYDROCODONE-ACETAMINOPHEN 7.5-325 MG PO TABS: 1.0000 | ORAL_TABLET | Freq: Four times a day (QID) | ORAL | 0 refills | Status: DC | PRN

## 2023-03-02 ENCOUNTER — Encounter: Payer: Self-pay | Admitting: Orthopaedic Surgery

## 2023-03-08 ENCOUNTER — Encounter: Payer: Self-pay | Admitting: Orthopaedic Surgery

## 2023-03-08 ENCOUNTER — Other Ambulatory Visit (INDEPENDENT_AMBULATORY_CARE_PROVIDER_SITE_OTHER): Payer: Self-pay

## 2023-03-08 ENCOUNTER — Ambulatory Visit: Payer: BC Managed Care – PPO | Admitting: Orthopaedic Surgery

## 2023-03-08 ENCOUNTER — Other Ambulatory Visit (INDEPENDENT_AMBULATORY_CARE_PROVIDER_SITE_OTHER): Payer: BC Managed Care – PPO

## 2023-03-08 VITALS — BP 136/100 | HR 82 | Ht 67.5 in | Wt 144.0 lb

## 2023-03-08 DIAGNOSIS — M25552 Pain in left hip: Secondary | ICD-10-CM

## 2023-03-08 DIAGNOSIS — M4316 Spondylolisthesis, lumbar region: Secondary | ICD-10-CM

## 2023-03-08 DIAGNOSIS — M7062 Trochanteric bursitis, left hip: Secondary | ICD-10-CM | POA: Diagnosis not present

## 2023-03-08 DIAGNOSIS — M545 Low back pain, unspecified: Secondary | ICD-10-CM

## 2023-03-08 MED ORDER — HYDROCODONE-ACETAMINOPHEN 7.5-325 MG PO TABS: 1.0000 | ORAL_TABLET | Freq: Four times a day (QID) | ORAL | 0 refills | Status: DC | PRN

## 2023-03-08 MED ORDER — METHYLPREDNISOLONE ACETATE 40 MG/ML IJ SUSP
40.0000 mg | Freq: Once | INTRAMUSCULAR | Status: AC
  Administered 2023-03-08: 40 mg via INTRA_ARTICULAR

## 2023-03-08 NOTE — Progress Notes (Signed)
My hip is hurting.  She has pain in the left hip.  She also has increased pain in the lower back. She has no spasm.  She is using a cane.  Nothing seems to help.  She is very tender over the left hip trochanteric area.  She has no swelling.  She has no redness.  ROM of the left hip is full but tender over the trochanteric area.  ROM of the back is limited secondary to pain.  NV intact.  She has no spasm.  Muscle tone and strength normal.  She is using a cane.  She has a CAM walker for another problem treated by another physician.  X-rays were done of the lumbar spine, reported separately.  Encounter Diagnoses  Name Primary?   Pain in left hip Yes   Chronic midline low back pain without sciatica    Trochanteric bursitis, left hip    PROCEDURE NOTE:  The patient request injection, verbal consent was obtained.  The left trochanteric area of the hip was prepped appropriately after time out was performed.   Sterile technique was observed and injection of 1 cc of DepoMedrol 40 mg with several cc's of plain xylocaine. Anesthesia was provided by ethyl chloride and a 20-gauge needle was used to inject the hip area. The injection was tolerated well.  A band aid dressing was applied.  The patient was advised to apply ice later today and tomorrow to the injection sight as needed.  I have reviewed the [place] Controlled Substance Reporting System web site prior to prescribing narcotic medicine for this patient.  Return in one month.  I will get MRI of the back.  She has a grade I spondylolisthesis.  Call if any problem.  Precautions discussed.  Electronically Signed Darreld Mclean, MD 9/3/20242:46 PM

## 2023-03-08 NOTE — Patient Instructions (Signed)
Central scheduling [phone] 

## 2023-03-15 ENCOUNTER — Ambulatory Visit (HOSPITAL_COMMUNITY)
Admission: RE | Admit: 2023-03-15 | Discharge: 2023-03-15 | Disposition: A | Discharge status: Home / Self Care | Payer: BC Managed Care – PPO | Source: Ambulatory Visit | Attending: Orthopaedic Surgery | Admitting: Orthopaedic Surgery

## 2023-03-15 DIAGNOSIS — G8929 Other chronic pain: Secondary | ICD-10-CM | POA: Insufficient documentation

## 2023-03-15 DIAGNOSIS — M545 Low back pain, unspecified: Secondary | ICD-10-CM | POA: Diagnosis not present

## 2023-03-16 DIAGNOSIS — M4316 Spondylolisthesis, lumbar region: Secondary | ICD-10-CM | POA: Diagnosis not present

## 2023-03-16 DIAGNOSIS — M5116 Intervertebral disc disorders with radiculopathy, lumbar region: Secondary | ICD-10-CM | POA: Diagnosis not present

## 2023-03-16 DIAGNOSIS — M5124 Other intervertebral disc displacement, thoracic region: Secondary | ICD-10-CM | POA: Diagnosis not present

## 2023-03-16 DIAGNOSIS — M5115 Intervertebral disc disorders with radiculopathy, thoracolumbar region: Secondary | ICD-10-CM | POA: Diagnosis not present

## 2023-04-05 ENCOUNTER — Encounter: Payer: Self-pay | Admitting: Orthopaedic Surgery

## 2023-04-05 ENCOUNTER — Ambulatory Visit: Payer: BC Managed Care – PPO | Admitting: Orthopaedic Surgery

## 2023-04-05 ENCOUNTER — Telehealth: Payer: Self-pay | Admitting: Physical Medicine and Rehabilitation

## 2023-04-05 DIAGNOSIS — M545 Low back pain, unspecified: Secondary | ICD-10-CM

## 2023-04-05 DIAGNOSIS — M5124 Other intervertebral disc displacement, thoracic region: Secondary | ICD-10-CM | POA: Diagnosis not present

## 2023-04-05 DIAGNOSIS — M5136 Other intervertebral disc degeneration, lumbar region with discogenic back pain only: Secondary | ICD-10-CM | POA: Diagnosis not present

## 2023-04-05 MED ORDER — HYDROCODONE-ACETAMINOPHEN 7.5-325 MG PO TABS: 1.0000 | ORAL_TABLET | Freq: Four times a day (QID) | ORAL | 0 refills | Status: DC | PRN

## 2023-04-05 NOTE — Telephone Encounter (Signed)
Patient called needing to schedule an appointment with Dr. Alvester Morin for her back. The number to contact patient is [phone]

## 2023-04-05 NOTE — Patient Instructions (Signed)
OOW note for 3 weeks until she sees Dr Hilda Lias again

## 2023-04-05 NOTE — Progress Notes (Signed)
My back is hurting more.  She had the MRI of the lumbar spine and it showed: IMPRESSION: 1. L5 is sacralized, as numbered previously. 2. T11-12: Disc herniation with upward migration behind T11. This indents the thecal sac but does not cause visible neural compression. This is new since 2017. 3. T12-L1: Shallow central to left-sided disc herniation without visible neural compression. This is slightly worsened since 2017. 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 4. L2-3: Moderate bulging of the disc, slightly more prominent towards the left. Bilateral facet degeneration and hypertrophy. Mild narrowing of the foramen on the left but no likely neural compression. Worsening since 2017. 5. L3-4: Advanced bilateral facet arthropathy now with 8 mm of anterolisthesis, increased from 2 mm in 2017. Desiccation and shallow protrusion of the disc. Moderate stenosis of the canal and both lateral recesses but without definite neural compression. Degenerative endplate edema which could relate to regional pain. Findings are worsened since 2017. 6. L4-5: Disc degeneration with endplate osteophytes and shallow protrusion of the disc. Facet degeneration and hypertrophy. No definite neural compression. No change since 2017.  I have reviewed the MRI. I have discussed it with her.  I will have her see neurosurgery.  Her back is tender, ROM good but pain in the extremes, NV intact, no spasm, muscle tone and strength normal.  She has a CAM walker on the right foot and uses a cane.  Encounter Diagnosis  Name Primary?   Chronic midline low back pain without sciatica Yes   I will have her see neurosurgery.  I will renew pain medicine.  I have reviewed the [place] Controlled Substance Reporting System web site prior to prescribing narcotic medicine for this patient.  Return in three weeks.  Stay out of work.  Call if any problem.  Precautions  discussed.  Electronically Signed Darreld Mclean, MD 10/1/20242:47 PM

## 2023-04-06 NOTE — Telephone Encounter (Signed)
Spoke with patient and scheduled OV for 04/12/23.

## 2023-04-07 ENCOUNTER — Telehealth: Payer: Self-pay | Admitting: Physical Medicine and Rehabilitation

## 2023-04-07 NOTE — Telephone Encounter (Signed)
Patient called and confirmed her appointment.   [phone]

## 2023-04-09 ENCOUNTER — Telehealth: Payer: Self-pay | Admitting: Orthopaedic Surgery

## 2023-04-12 ENCOUNTER — Ambulatory Visit: Payer: BC Managed Care – PPO | Admitting: Physical Medicine and Rehabilitation

## 2023-04-12 ENCOUNTER — Encounter: Payer: Self-pay | Admitting: Physical Medicine and Rehabilitation

## 2023-04-12 DIAGNOSIS — M5416 Radiculopathy, lumbar region: Secondary | ICD-10-CM | POA: Diagnosis not present

## 2023-04-12 DIAGNOSIS — M4316 Spondylolisthesis, lumbar region: Secondary | ICD-10-CM

## 2023-04-12 DIAGNOSIS — M47816 Spondylosis without myelopathy or radiculopathy, lumbar region: Secondary | ICD-10-CM | POA: Diagnosis not present

## 2023-04-12 DIAGNOSIS — M48062 Spinal stenosis, lumbar region with neurogenic claudication: Secondary | ICD-10-CM

## 2023-04-12 NOTE — Progress Notes (Unsigned)
Sandra Davies - 62 y.o. female MRN [id number]  Date of birth: [birth year]  Office Visit Note: Visit Date: 04/12/2023 PCP: Benita Stabile, MD Referred by: Benita Stabile, MD  Subjective: Chief Complaint  Patient presents with   Lower Back - Pain   HPI: Sandra Davies is a 62 y.o. female who comes in today per the request of Dr. Darreld Mclean for evaluation of chronic, worsening and severe bilateral lower back pain radiating to buttocks, hips and down posterolateral legs, left greater than right. Pain ongoing for several months, worsens with movement and activity. Severe pain with prolonged standing and walking. She describes pain as sharp and stabbing sensation, currently rates as 10 out of 10. Some relief of pain with home exercise regimen, rest and use of medications. Dr. Hilda Lias prescribes 100 tablets of 7.5-325 mg Norco for her monthly.  No history of formal physical therapy. Recent lumbar MRI imaging exhibits advanced bilateral facet arthropathy with 8 mm of anterolisthesis at L3-L4. There is also moderate spinal canal stenosis at this level. Findings have worsened from prior lumbar MRI imaging in 2017. No history of lumbar surgery/injections. She is wearing boot on right foot upon arrival to office today, states she suffered work related injury to right foot in January. Patient denies focal weakness, numbness and tingling. No recent trauma or falls.     Oswestry Disability Index Score 42% 20 to 30 (60%) severe disability: Pain remains the main problem in this group but activities of daily living are affected. These patients require a detailed investigation.  Review of Systems  Musculoskeletal:  Positive for back pain and myalgias.  Neurological:  Negative for tingling, sensory change, focal weakness and weakness.  All other systems reviewed and are negative.  Otherwise per HPI.  Assessment & Plan: Visit Diagnoses:    ICD-10-CM   1. Lumbar radiculopathy  M54.16     2. Spinal  stenosis of lumbar region with neurogenic claudication  M48.062     3. Anterolisthesis of lumbar spine  M43.16     4. Facet arthropathy, lumbar  M47.816        Plan: Findings:  Chronic, worsening and severe bilateral lower back pain radiating to buttocks, hips and down posterolateral legs, left greater than right. Patient continues to have severe pain despite good conservative therapies such as home exercise regimen, rest and use of medications. Patients clinical presentation and exam are consistent with neurogenic claudication as a result of spinal canal stenosis. There is moderate spinal canal stenosis at the level of L3-L4. We discussed treatment plan in detail today. Next step is to perform diagnostic and hopefully therapeutic left L4-L5 interlaminar epidural steroid injection under fluoroscopic guidance. She is not currently taking anticoagulant medications. Dr. Alvester Morin at bedside to discuss injection procedure. If good relief of pain we can repeat this procedure infrequently as needed. If her pain persists would consider referral to our spine surgeon Dr. Willia Craze. No red flag symptoms noted upon exam today.     Meds & Orders: No orders of the defined types were placed in this encounter.  No orders of the defined types were placed in this encounter.   Follow-up: Return for Left L4-L5 interlaminar epidural steroid injection.   Procedures: No procedures performed      Clinical History: CLINICAL DATA:  Chronic low back pain. Midline low back pain without sciatica.   EXAM: MRI LUMBAR SPINE WITHOUT CONTRAST   TECHNIQUE: Multiplanar, multisequence MR imaging of the lumbar spine was performed.  No intravenous contrast was administered.   COMPARISON:  08/20/2015   FINDINGS: Segmentation:  L5 is sacralized, as numbered previously.   Alignment: Degenerative anterolisthesis at L3-4 now measuring 8 mm, increased from 2 mm in 2017.   Vertebrae: No fracture or focal bone lesion.  Minimal degenerative endplate edema at the superior endplate of T12 and both endplates at L3-4.   Conus medullaris and cauda equina: Conus extends to the L1-2 level. Conus and cauda equina appear normal.   Paraspinal and other soft tissues: Negative   Disc levels:   T11-12: Disc herniation with upward migration behind T11. This indents the thecal sac but does not cause visible neural compression. This is new since 2017.   T12-L1: Shallow central to left-sided disc herniation indents the thecal sac slightly but does not cause neural compression. This has worsened since 2017. 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 No significant change in this finding since 2017.   L1-2: Minimal disc bulge.  No stenosis.   L2-3: Moderate bulging of the disc, slightly more prominent towards the left. Bilateral facet degeneration and hypertrophy. Mild narrowing of the foramen on the left but no likely neural compression. Worsening since 2017.   L3-4: Advanced bilateral facet arthropathy now with 8 mm of anterolisthesis, increased from 2 mm in 2017. Desiccation and shallow protrusion of the disc. Moderate stenosis of the canal and both lateral recesses but without definite neural compression. Findings are worsened since 2017.   L4-5: Disc degeneration with endplate osteophytes and shallow protrusion of the disc. Facet degeneration and hypertrophy. No definite neural compression. No change seen since 2017.   L5-S1: Transitional, rudimentary and normal.   IMPRESSION: 1. L5 is sacralized, as numbered previously. 2. T11-12: Disc herniation with upward migration behind T11. This indents the thecal sac but does not cause visible neural compression. This is new since 2017. 3. T12-L1: Shallow central to left-sided disc herniation without visible neural compression. This is slightly worsened since 2017. 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 4. L2-3: Moderate bulging of the disc, slightly more prominent towards the left. Bilateral facet degeneration and hypertrophy. Mild narrowing of the foramen on the left but no likely neural compression. Worsening since 2017. 5. L3-4: Advanced bilateral facet arthropathy now with 8 mm of anterolisthesis, increased from 2 mm in 2017. Desiccation and shallow protrusion of the disc. Moderate stenosis of the canal and both lateral recesses but without definite neural compression. Degenerative endplate edema which could relate to regional pain. Findings are worsened since 2017. 6. L4-5: Disc degeneration with endplate osteophytes and shallow protrusion of the disc. Facet degeneration and hypertrophy. No definite neural compression. No change since 2017.     Electronically Signed   By: Paulina Fusi M.D.   On: 04/01/2023 13:35    She reports that she has never smoked. She has never used smokeless tobacco. No results for input(s): "HGBA1C", "LABURIC" in the last 8760 hours.  Objective:  VS:  HT:    WT:   BMI:     BP:   HR: bpm  TEMP: ( )  RESP:  Physical Exam Vitals and nursing note reviewed.  HENT:     Head: Normocephalic and atraumatic.     Right Ear: External ear normal.     Left Ear: External ear normal.     Nose: Nose normal.     Mouth/Throat:     Mouth: Mucous membranes are moist.  Eyes:  Extraocular Movements: Extraocular movements intact.  Cardiovascular:     Rate and Rhythm: Normal rate.     Pulses: Normal pulses.  Pulmonary:     Effort: Pulmonary effort is normal.  Abdominal:     General: Abdomen is flat. There is no distension.  Musculoskeletal:        General: Tenderness present.     Cervical back: Normal range of motion.     Comments: Patient rises from seated position to standing without difficulty. Good lumbar range of motion. No pain noted with facet loading. 5/5 strength noted with bilateral hip flexion, knee flexion/extension, ankle  dorsiflexion/plantarflexion and EHL. No clonus noted bilaterally. No pain upon palpation of greater trochanters. No pain with internal/external rotation of bilateral hips. Sensation intact bilaterally. Negative slump test bilaterally. Ambulates without aid, gait steady.     Skin:    General: Skin is warm and dry.     Capillary Refill: Capillary refill takes less than 2 seconds.  Neurological:     General: No focal deficit present.     Mental Status: She is alert and oriented to person, place, and time.  Psychiatric:        Mood and Affect: Mood normal.        Behavior: Behavior normal.     Ortho Exam  Imaging: No results found.  Past Medical/Family/Surgical/Social History: Medications & Allergies reviewed per EMR, new medications updated. Patient Active Problem List   Diagnosis Date Noted   Fatigue 04/18/2021   Insomnia 04/18/2021   Gastroesophageal reflux disease without esophagitis 04/16/2021   Menopausal syndrome 04/16/2021   Vitamin D deficiency 04/16/2021   Impaired fasting glucose 01/06/2021   Mixed hyperlipidemia 01/06/2021   Midline low back pain without sciatica 11/06/2015   Menopausal symptoms 04/09/2015   Dysphagia, pharyngoesophageal phase    Dysphagia 08/21/2014   Constipation 08/21/2014   IRREGULAR MENSTRUAL CYCLE 11/15/2007   VERTIGO, CHRONIC 11/15/2007   HEADACHE 11/15/2007   SYNCOPE, HX OF 11/15/2007   Past Medical History:  Diagnosis Date   Arthritis    Constipation - functional    FOR A LONG TIME   Dysphagia 2015   GERD (gastroesophageal reflux disease)    Family History  Problem Relation Age of Onset   Breast cancer Mother        DECEASED   Alzheimer's disease Father    Heart disease Brother    Lupus Sister    Colon polyps Neg Hx    Colon cancer Neg Hx    Stomach cancer Neg Hx    Past Surgical History:  Procedure Laterality Date   ESOPHAGEAL DILATION N/A 09/03/2014   Procedure: ESOPHAGEAL DILATION;  Surgeon: West Bali, MD;  Location:  [facility];  Service: Endoscopy;  Laterality: N/A;   ESOPHAGOGASTRODUODENOSCOPY N/A 09/03/2014   WGN:FAOZHYQM gastritis/dysphagia due to distal esophagel stricture   ESOPHAGOGASTRODUODENOSCOPY N/A 09/26/2014   VHQ:IONG non-erosive gastritis/stricture at the gastroesphageal   KNEE SURGERY     Social History   Occupational History   Not on file  Tobacco Use   Smoking status: Never   Smokeless tobacco: Never  Vaping Use   Vaping status: Never Used  Substance and Sexual Activity   Alcohol use: Yes    Alcohol/week: 0.0 standard drinks of alcohol    Comment: very occasional   Drug use: No   Sexual activity: Not Currently    Birth control/protection: Post-menopausal

## 2023-04-12 NOTE — Progress Notes (Unsigned)
Functional Pain Scale - descriptive words and definitions  Distracting (5)    Aware of pain/able to complete some ADL's but limited by pain/sleep is affected and active distractions are only slightly useful. Moderate range order  Average Pain 9  Lower back pain on both sides that radiates into both legs

## 2023-04-20 ENCOUNTER — Telehealth: Payer: Self-pay | Admitting: Physical Medicine and Rehabilitation

## 2023-04-20 ENCOUNTER — Telehealth: Payer: Self-pay

## 2023-04-20 DIAGNOSIS — F411 Generalized anxiety disorder: Secondary | ICD-10-CM

## 2023-04-20 MED ORDER — DIAZEPAM 5 MG PO TABS: ORAL_TABLET | ORAL | 0 refills | Status: AC

## 2023-04-20 NOTE — Telephone Encounter (Signed)
Patient returned call asked for a call back. The number to contact patient is [phone]

## 2023-04-20 NOTE — Telephone Encounter (Signed)
ERROR

## 2023-04-20 NOTE — Telephone Encounter (Signed)
Patient scheduled for injection on 04/25/23. Needs pre procedure Valium sent to [facility].

## 2023-04-25 ENCOUNTER — Ambulatory Visit: Payer: BC Managed Care – PPO | Admitting: Physical Medicine and Rehabilitation

## 2023-04-25 ENCOUNTER — Telehealth: Payer: Self-pay | Admitting: Orthopaedic Surgery

## 2023-04-25 ENCOUNTER — Other Ambulatory Visit: Payer: Self-pay

## 2023-04-25 VITALS — BP 154/95 | HR 76

## 2023-04-25 DIAGNOSIS — M5416 Radiculopathy, lumbar region: Secondary | ICD-10-CM | POA: Diagnosis not present

## 2023-04-25 MED ORDER — METHYLPREDNISOLONE ACETATE 40 MG/ML IJ SUSP
40.0000 mg | Freq: Once | INTRAMUSCULAR | Status: AC
  Administered 2023-04-25: 40 mg

## 2023-04-25 NOTE — Patient Instructions (Signed)

## 2023-04-25 NOTE — Telephone Encounter (Signed)
Dr. Sanjuan Dame pt - spoke w/the pt, she had to reschedule her appt for 10/22 to 10/29 due to transportation.  She is requesting her OOW note be extended to 10/29, please advise.

## 2023-04-25 NOTE — Progress Notes (Signed)
Functional Pain Scale - descriptive words and definitions  Distressing (6)    Pain is present/unable to complete most ADLs limited by pain/sleep is difficult and active distraction is only marginal. Moderate range order  Average Pain 6   +Driver, -BT, -Dye Allergies.  Lower back pain on the left side that radiates down the left leg

## 2023-04-26 ENCOUNTER — Encounter: Payer: Self-pay | Admitting: Orthopaedic Surgery

## 2023-04-26 ENCOUNTER — Ambulatory Visit: Payer: BC Managed Care – PPO | Admitting: Orthopaedic Surgery

## 2023-04-26 ENCOUNTER — Telehealth: Payer: Self-pay | Admitting: Orthopaedic Surgery

## 2023-04-26 NOTE — Telephone Encounter (Signed)
[employer] forms received. To [employer]. 

## 2023-04-26 NOTE — Telephone Encounter (Signed)
I have taken care this, advised the patient and faxed it to her employer.

## 2023-05-03 ENCOUNTER — Ambulatory Visit: Payer: BC Managed Care – PPO | Admitting: Orthopaedic Surgery

## 2023-05-03 ENCOUNTER — Encounter: Payer: Self-pay | Admitting: Orthopaedic Surgery

## 2023-05-03 ENCOUNTER — Other Ambulatory Visit (INDEPENDENT_AMBULATORY_CARE_PROVIDER_SITE_OTHER): Payer: BC Managed Care – PPO

## 2023-05-03 VITALS — BP 159/98 | HR 78 | Ht 67.5 in | Wt 144.0 lb

## 2023-05-03 DIAGNOSIS — G8929 Other chronic pain: Secondary | ICD-10-CM

## 2023-05-03 DIAGNOSIS — M25561 Pain in right knee: Secondary | ICD-10-CM

## 2023-05-03 DIAGNOSIS — M25512 Pain in left shoulder: Secondary | ICD-10-CM

## 2023-05-03 DIAGNOSIS — M545 Low back pain, unspecified: Secondary | ICD-10-CM

## 2023-05-03 MED ORDER — CYCLOBENZAPRINE HCL 10 MG PO TABS: 10.0000 mg | ORAL_TABLET | Freq: Every day | ORAL | 3 refills | Status: DC

## 2023-05-03 MED ORDER — PREDNISONE 5 MG (21) PO TBPK: ORAL_TABLET | ORAL | 0 refills | Status: DC

## 2023-05-03 MED ORDER — DICLOFENAC SODIUM 75 MG PO TBEC: 75.0000 mg | DELAYED_RELEASE_TABLET | Freq: Two times a day (BID) | ORAL | 2 refills | Status: AC

## 2023-05-03 NOTE — Patient Instructions (Signed)
OUT OF WORK ?

## 2023-05-03 NOTE — Procedures (Signed)
Lumbar Epidural Steroid Injection - Interlaminar Approach with Fluoroscopic Guidance  Patient: Sandra Davies      Date of Birth: 08/30/1960 MRN: [id number] PCP: Benita Stabile, MD      Visit Date: 04/25/2023   Universal Protocol:     Consent Given By: the patient  Position: PRONE  Additional Comments: Vital signs were monitored before and after the procedure. Patient was prepped and draped in the usual sterile fashion. The correct patient, procedure, and site was verified.   Injection Procedure Details:   Procedure diagnoses: Lumbar radiculopathy [M54.16]   Meds Administered:  Meds ordered this encounter  Medications   methylPREDNISolone acetate (DEPO-MEDROL) injection 40 mg     Laterality: Left  Location/Site:  L4-5  Needle: 3.5 in., 20 ga. Tuohy  Needle Placement: Paramedian epidural  Findings:   -Comments: Excellent flow of contrast into the epidural space.  Procedure Details: Using a paramedian approach from the side mentioned above, the region overlying the inferior lamina was localized under fluoroscopic visualization and the soft tissues overlying this structure were infiltrated with 4 ml. of 1% Lidocaine without Epinephrine. The Tuohy needle was inserted into the epidural space using a paramedian approach.   The epidural space was localized using loss of resistance along with counter oblique bi-planar fluoroscopic views.  After negative aspirate for air, blood, and CSF, a 2 ml. volume of Isovue-250 was injected into the epidural space and the flow of contrast was observed. Radiographs were obtained for documentation purposes.    The injectate was administered into the level noted above.   Additional Comments:  No complications occurred Dressing: 2 x 2 sterile gauze and Band-Aid    Post-procedure details: Patient was observed during the procedure. Post-procedure instructions were reviewed.  Patient left the clinic in stable condition.

## 2023-05-03 NOTE — Progress Notes (Signed)
My left shoulder hurts, my right knee hurts and my back hurts.  She has pain in the left shoulder.  She has no trauma. She has pain with overhead use.  She has no swelling or redness or numbness.  She has chronic pain in the right knee and it started swelling and hurting more over the last weeks. She has no trauma, no redness, no edema of the leg.  Her back is tender but no weakness.  She has no trauma.  Left shoulder has decreased motion:  forward 120, abduction 90, internal 20, external 20, extension 5, adduction full.  Right knee has ROM 0 to 110, some effusion and crepitus, slight limp right, NV intact, no distal edema.  Encounter Diagnoses  Name Primary?   Chronic left shoulder pain Yes   Chronic pain of right knee    Chronic midline low back pain without sciatica    X-rays were done of the left shoulder, reported separately.  I will give prednisone dose pack.  Hold on the diclofenac while on the prednisone.  I will refill flexeril and the diclofenac.  Return in two weeks. Stay of work.  Call if any problem.  Precautions discussed.  Electronically Signed Darreld Mclean, MD 10/29/20241:55 PM

## 2023-05-03 NOTE — Progress Notes (Signed)
Sandra Davies - 62 y.o. female MRN [id number]  Date of birth: 1960-12-31  Office Visit Note: Visit Date: 04/25/2023 PCP: Benita Stabile, MD Referred by: Benita Stabile, MD  Subjective: Chief Complaint  Patient presents with   Lower Back - Pain   HPI:  Sandra Davies is a 62 y.o. female who comes in today at the request of Ellin Goodie, FNP and Dr. Darreld Mclean for planned Left L4-5 Lumbar Interlaminar epidural steroid injection with fluoroscopic guidance.  The patient has failed conservative care including home exercise, medications, time and activity modification.  This injection will be diagnostic and hopefully therapeutic.  Please see requesting physician notes for further details and justification.   ROS Otherwise per HPI.  Assessment & Plan: Visit Diagnoses:    ICD-10-CM   1. Lumbar radiculopathy  M54.16 XR C-ARM NO REPORT    Epidural Steroid injection    methylPREDNISolone acetate (DEPO-MEDROL) injection 40 mg      Plan: No additional findings.   Meds & Orders:  Meds ordered this encounter  Medications   methylPREDNISolone acetate (DEPO-MEDROL) injection 40 mg    Orders Placed This Encounter  Procedures   XR C-ARM NO REPORT   Epidural Steroid injection    Follow-up: Return for visit to requesting provider as needed.   Procedures: No procedures performed  Lumbar Epidural Steroid Injection - Interlaminar Approach with Fluoroscopic Guidance  Patient: Sandra Davies      Date of Birth: 1961/07/04 MRN: [id number] PCP: Benita Stabile, MD      Visit Date: 04/25/2023   Universal Protocol:     Consent Given By: the patient  Position: PRONE  Additional Comments: Vital signs were monitored before and after the procedure. Patient was prepped and draped in the usual sterile fashion. The correct patient, procedure, and site was verified.   Injection Procedure Details:   Procedure diagnoses: Lumbar radiculopathy [M54.16]   Meds Administered:  Meds  ordered this encounter  Medications   methylPREDNISolone acetate (DEPO-MEDROL) injection 40 mg     Laterality: Left  Location/Site:  L4-5  Needle: 3.5 in., 20 ga. Tuohy  Needle Placement: Paramedian epidural  Findings:   -Comments: Excellent flow of contrast into the epidural space.  Procedure Details: Using a paramedian approach from the side mentioned above, the region overlying the inferior lamina was localized under fluoroscopic visualization and the soft tissues overlying this structure were infiltrated with 4 ml. of 1% Lidocaine without Epinephrine. The Tuohy needle was inserted into the epidural space using a paramedian approach.   The epidural space was localized using loss of resistance along with counter oblique bi-planar fluoroscopic views.  After negative aspirate for air, blood, and CSF, a 2 ml. volume of Isovue-250 was injected into the epidural space and the flow of contrast was observed. Radiographs were obtained for documentation purposes.    The injectate was administered into the level noted above.   Additional Comments:  No complications occurred Dressing: 2 x 2 sterile gauze and Band-Aid    Post-procedure details: Patient was observed during the procedure. Post-procedure instructions were reviewed.  Patient left the clinic in stable condition.   Clinical History: CLINICAL DATA:  Chronic low back pain. Midline low back pain without sciatica.   EXAM: MRI LUMBAR SPINE WITHOUT CONTRAST   TECHNIQUE: Multiplanar, multisequence MR imaging of the lumbar spine was performed. No intravenous contrast was administered.   COMPARISON:  08/20/2015   FINDINGS: Segmentation:  L5 is sacralized, as numbered previously.  Alignment: Degenerative anterolisthesis at L3-4 now measuring 8 mm, increased from 2 mm in 2017.   Vertebrae: No fracture or focal bone lesion. Minimal degenerative endplate edema at the superior endplate of T12 and both endplates at L3-4.    Conus medullaris and cauda equina: Conus extends to the L1-2 level. Conus and cauda equina appear normal.   Paraspinal and other soft tissues: Negative   Disc levels:   T11-12: Disc herniation with upward migration behind T11. This indents the thecal sac but does not cause visible neural compression. This is new since 2017.   T12-L1: Shallow central to left-sided disc herniation indents the thecal sac slightly but does not cause neural compression. This has worsened since 2017. 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 No significant change in this finding since 2017.   L1-2: Minimal disc bulge.  No stenosis.   L2-3: Moderate bulging of the disc, slightly more prominent towards the left. Bilateral facet degeneration and hypertrophy. Mild narrowing of the foramen on the left but no likely neural compression. Worsening since 2017.   L3-4: Advanced bilateral facet arthropathy now with 8 mm of anterolisthesis, increased from 2 mm in 2017. Desiccation and shallow protrusion of the disc. Moderate stenosis of the canal and both lateral recesses but without definite neural compression. Findings are worsened since 2017.   L4-5: Disc degeneration with endplate osteophytes and shallow protrusion of the disc. Facet degeneration and hypertrophy. No definite neural compression. No change seen since 2017.   L5-S1: Transitional, rudimentary and normal.   IMPRESSION: 1. L5 is sacralized, as numbered previously. 2. T11-12: Disc herniation with upward migration behind T11. This indents the thecal sac but does not cause visible neural compression. This is new since 2017. 3. T12-L1: Shallow central to left-sided disc herniation without visible neural compression. This is slightly worsened since 2017. 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 4. L2-3: Moderate bulging of the disc, slightly more prominent towards the left. Bilateral  facet degeneration and hypertrophy. Mild narrowing of the foramen on the left but no likely neural compression. Worsening since 2017. 5. L3-4: Advanced bilateral facet arthropathy now with 8 mm of anterolisthesis, increased from 2 mm in 2017. Desiccation and shallow protrusion of the disc. Moderate stenosis of the canal and both lateral recesses but without definite neural compression. Degenerative endplate edema which could relate to regional pain. Findings are worsened since 2017. 6. L4-5: Disc degeneration with endplate osteophytes and shallow protrusion of the disc. Facet degeneration and hypertrophy. No definite neural compression. No change since 2017.     Electronically Signed   By: Paulina Fusi M.D.   On: 04/01/2023 13:35     Objective:  VS:  HT:    WT:   BMI:     BP:(!) 154/95  HR:76bpm  TEMP: ( )  RESP:  Physical Exam Vitals and nursing note reviewed.  Constitutional:      General: She is not in acute distress.    Appearance: Normal appearance. She is not ill-appearing.  HENT:     Head: Normocephalic and atraumatic.     Right Ear: External ear normal.     Left Ear: External ear normal.  Eyes:     Extraocular Movements: Extraocular movements intact.  Cardiovascular:     Rate and Rhythm: Normal rate.     Pulses: Normal pulses.  Pulmonary:     Effort: Pulmonary effort is normal. No respiratory distress.  Abdominal:  General: There is no distension.     Palpations: Abdomen is soft.  Musculoskeletal:        General: Tenderness present.     Cervical back: Neck supple.     Right lower leg: No edema.     Left lower leg: No edema.     Comments: Patient has good distal strength with no pain over the greater trochanters.  No clonus or focal weakness.  Skin:    Findings: No erythema, lesion or rash.  Neurological:     General: No focal deficit present.     Mental Status: She is alert and oriented to person, place, and time.     Sensory: No sensory deficit.      Motor: No weakness or abnormal muscle tone.     Coordination: Coordination normal.  Psychiatric:        Mood and Affect: Mood normal.        Behavior: Behavior normal.      Imaging: DG Shoulder Left  Result Date: 05/03/2023 Clinical:  left shoulder pain X-rays were done of the left shoulder, two views. The humeral head is well located within the glenoid area.  No fracture or loose body noted.  Bone quality is good. Apex of lung is clear. Impression:  negative left shoulder, no acute findings. Electronically Signed Darreld Mclean, MD 10/29/20241:52 PM

## 2023-05-05 ENCOUNTER — Telehealth: Payer: Self-pay | Admitting: Orthopaedic Surgery

## 2023-05-05 NOTE — Telephone Encounter (Signed)
[employer] forms received. To [employer]. 

## 2023-05-18 ENCOUNTER — Encounter: Payer: Self-pay | Admitting: Orthopaedic Surgery

## 2023-05-18 ENCOUNTER — Ambulatory Visit: Payer: BC Managed Care – PPO | Admitting: Orthopaedic Surgery

## 2023-05-18 DIAGNOSIS — M25512 Pain in left shoulder: Secondary | ICD-10-CM | POA: Diagnosis not present

## 2023-05-18 DIAGNOSIS — G8929 Other chronic pain: Secondary | ICD-10-CM

## 2023-05-18 DIAGNOSIS — M545 Low back pain, unspecified: Secondary | ICD-10-CM | POA: Diagnosis not present

## 2023-05-18 DIAGNOSIS — M25561 Pain in right knee: Secondary | ICD-10-CM | POA: Diagnosis not present

## 2023-05-18 MED ORDER — OXYCODONE-ACETAMINOPHEN 5-325 MG PO TABS: 1.0000 | ORAL_TABLET | Freq: Four times a day (QID) | ORAL | 0 refills | Status: DC | PRN

## 2023-05-18 NOTE — Addendum Note (Signed)
Addended byCaffie Damme on: 05/18/2023 02:38 PM   Modules accepted: Orders

## 2023-05-18 NOTE — Progress Notes (Signed)
My back is worse.  My shoulder hurts.  She has more pain in the lower back.  She had MRI in September. She had epidural by Dr. Alvester Morin 04-25-23 but she still has pain.  I will have neurosurgery see her.  Her left shoulder is more painful.  She has no new trauma.  She has no swelling.  ROM of the shoulder is decreased.  NV intact,  No effusion or redness.  Lower back is painful, decreased ROM, NV intact, no spasm, muscle tone and strength is normal.  Encounter Diagnoses  Name Primary?   Chronic left shoulder pain Yes   Chronic midline low back pain without sciatica    Chronic pain of right knee    PROCEDURE NOTE:  The patient request injection, verbal consent was obtained.  The left shoulder was prepped appropriately after time out was performed.   Sterile technique was observed and injection of 1 cc of DepoMedrol 40mg  with several cc's of plain xylocaine. Anesthesia was provided by ethyl chloride and a 20-gauge needle was used to inject the shoulder area. A posterior approach was used.  The injection was tolerated well.  A band aid dressing was applied.  The patient was advised to apply ice later today and tomorrow to the injection sight as needed.  To see neurosurgery.  I will increase pain medicine.  I have reviewed the [place] Controlled Substance Reporting System web site prior to prescribing narcotic medicine for this patient.  Return in one month.  Call if any problem.  Precautions discussed.  Electronically Signed Darreld Mclean, MD 11/13/20242:28 PM

## 2023-05-18 NOTE — Patient Instructions (Signed)
You have been referred to [facility] Neurosurgery on [facility] in [place], they will call you with appointment. If you have not heard anything in one week call them to schedule [phone]    Out of work until next visit

## 2023-05-22 DIAGNOSIS — M5126 Other intervertebral disc displacement, lumbar region: Secondary | ICD-10-CM | POA: Diagnosis not present

## 2023-05-22 DIAGNOSIS — R079 Chest pain, unspecified: Secondary | ICD-10-CM | POA: Diagnosis not present

## 2023-05-22 DIAGNOSIS — M545 Low back pain, unspecified: Secondary | ICD-10-CM | POA: Diagnosis not present

## 2023-05-22 DIAGNOSIS — M50321 Other cervical disc degeneration at C4-C5 level: Secondary | ICD-10-CM | POA: Diagnosis not present

## 2023-05-22 DIAGNOSIS — R1084 Generalized abdominal pain: Secondary | ICD-10-CM | POA: Diagnosis not present

## 2023-05-22 DIAGNOSIS — M50322 Other cervical disc degeneration at C5-C6 level: Secondary | ICD-10-CM | POA: Diagnosis not present

## 2023-05-22 DIAGNOSIS — M19012 Primary osteoarthritis, left shoulder: Secondary | ICD-10-CM | POA: Diagnosis not present

## 2023-05-22 DIAGNOSIS — M47812 Spondylosis without myelopathy or radiculopathy, cervical region: Secondary | ICD-10-CM | POA: Diagnosis not present

## 2023-05-22 DIAGNOSIS — M542 Cervicalgia: Secondary | ICD-10-CM | POA: Diagnosis not present

## 2023-05-22 DIAGNOSIS — R1030 Lower abdominal pain, unspecified: Secondary | ICD-10-CM | POA: Diagnosis not present

## 2023-05-22 DIAGNOSIS — M47816 Spondylosis without myelopathy or radiculopathy, lumbar region: Secondary | ICD-10-CM | POA: Diagnosis not present

## 2023-05-22 DIAGNOSIS — M19011 Primary osteoarthritis, right shoulder: Secondary | ICD-10-CM | POA: Diagnosis not present

## 2023-05-22 DIAGNOSIS — M4312 Spondylolisthesis, cervical region: Secondary | ICD-10-CM | POA: Diagnosis not present

## 2023-05-22 DIAGNOSIS — M48061 Spinal stenosis, lumbar region without neurogenic claudication: Secondary | ICD-10-CM | POA: Diagnosis not present

## 2023-05-22 DIAGNOSIS — Z041 Encounter for examination and observation following transport accident: Secondary | ICD-10-CM | POA: Diagnosis not present

## 2023-05-22 DIAGNOSIS — M549 Dorsalgia, unspecified: Secondary | ICD-10-CM | POA: Diagnosis not present

## 2023-05-23 DIAGNOSIS — G8929 Other chronic pain: Secondary | ICD-10-CM | POA: Diagnosis not present

## 2023-05-23 DIAGNOSIS — M25512 Pain in left shoulder: Secondary | ICD-10-CM | POA: Diagnosis not present

## 2023-05-23 MED ORDER — METHYLPREDNISOLONE ACETATE 40 MG/ML IJ SUSP
40.0000 mg | Freq: Once | INTRAMUSCULAR | Status: AC
  Administered 2023-05-23: 40 mg via INTRA_ARTICULAR

## 2023-05-23 NOTE — Addendum Note (Signed)
Addended by: Michaele Offer on: 05/23/2023 11:01 AM   Modules accepted: Orders

## 2023-05-25 ENCOUNTER — Telehealth: Payer: Self-pay | Admitting: Orthopaedic Surgery

## 2023-05-25 NOTE — Telephone Encounter (Signed)
[employer] forms received. Patient last seen 05/18/23. Please advise work status. Thank you!

## 2023-05-31 DIAGNOSIS — M47816 Spondylosis without myelopathy or radiculopathy, lumbar region: Secondary | ICD-10-CM | POA: Diagnosis not present

## 2023-05-31 DIAGNOSIS — M4316 Spondylolisthesis, lumbar region: Secondary | ICD-10-CM | POA: Diagnosis not present

## 2023-05-31 DIAGNOSIS — M48062 Spinal stenosis, lumbar region with neurogenic claudication: Secondary | ICD-10-CM | POA: Diagnosis not present

## 2023-06-07 ENCOUNTER — Encounter: Payer: Self-pay | Admitting: Orthopaedic Surgery

## 2023-06-15 ENCOUNTER — Ambulatory Visit: Payer: BC Managed Care – PPO | Admitting: Orthopaedic Surgery

## 2023-06-15 ENCOUNTER — Encounter: Payer: Self-pay | Admitting: Orthopaedic Surgery

## 2023-06-15 ENCOUNTER — Telehealth: Payer: Self-pay | Admitting: Orthopaedic Surgery

## 2023-06-15 VITALS — BP 154/94 | HR 99 | Ht 67.5 in | Wt 154.8 lb

## 2023-06-15 DIAGNOSIS — G8929 Other chronic pain: Secondary | ICD-10-CM

## 2023-06-15 DIAGNOSIS — M545 Low back pain, unspecified: Secondary | ICD-10-CM

## 2023-06-15 MED ORDER — OXYCODONE-ACETAMINOPHEN 5-325 MG PO TABS: 1.0000 | ORAL_TABLET | Freq: Four times a day (QID) | ORAL | 0 refills | Status: DC | PRN

## 2023-06-15 MED ORDER — CYCLOBENZAPRINE HCL 10 MG PO TABS: 10.0000 mg | ORAL_TABLET | Freq: Every day | ORAL | 3 refills | Status: DC

## 2023-06-15 NOTE — Telephone Encounter (Signed)
[employer] forms received. To [employer]. 
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
## 2023-06-15 NOTE — Patient Instructions (Signed)
OOW note until she comes back to see Dr Hilda Lias in  1 month

## 2023-06-22 ENCOUNTER — Ambulatory Visit (HOSPITAL_COMMUNITY): Payer: BC Managed Care – PPO

## 2023-06-24 ENCOUNTER — Ambulatory Visit (HOSPITAL_COMMUNITY): Payer: BC Managed Care – PPO

## 2023-06-27 DIAGNOSIS — I10 Essential (primary) hypertension: Secondary | ICD-10-CM | POA: Diagnosis not present

## 2023-07-08 ENCOUNTER — Ambulatory Visit (HOSPITAL_COMMUNITY): Payer: BC Managed Care – PPO

## 2023-07-13 ENCOUNTER — Ambulatory Visit (HOSPITAL_COMMUNITY): Payer: BC Managed Care – PPO | Admitting: Physical Therapy

## 2023-07-13 ENCOUNTER — Encounter: Payer: Self-pay | Admitting: Orthopaedic Surgery

## 2023-07-13 ENCOUNTER — Ambulatory Visit: Payer: BC Managed Care – PPO | Admitting: Orthopaedic Surgery

## 2023-07-13 ENCOUNTER — Encounter (HOSPITAL_COMMUNITY): Payer: Self-pay | Admitting: Physical Therapy

## 2023-07-13 VITALS — BP 103/79 | HR 77 | Ht 67.5 in | Wt 154.0 lb

## 2023-07-13 DIAGNOSIS — G8929 Other chronic pain: Secondary | ICD-10-CM

## 2023-07-13 DIAGNOSIS — M545 Low back pain, unspecified: Secondary | ICD-10-CM | POA: Diagnosis not present

## 2023-07-13 DIAGNOSIS — M47816 Spondylosis without myelopathy or radiculopathy, lumbar region: Secondary | ICD-10-CM | POA: Insufficient documentation

## 2023-07-13 DIAGNOSIS — M48061 Spinal stenosis, lumbar region without neurogenic claudication: Secondary | ICD-10-CM | POA: Insufficient documentation

## 2023-07-13 MED ORDER — OXYCODONE-ACETAMINOPHEN 5-325 MG PO TABS: 1.0000 | ORAL_TABLET | Freq: Four times a day (QID) | ORAL | 0 refills | Status: DC | PRN

## 2023-07-13 NOTE — Progress Notes (Signed)
 My back is worse with the cold weather.  She has had more pain in the lower back with the colder weather.  She has no new trauma.  She has no weakness.  She is to have surgery on the right foot from another provider for the [employer]. Injury she has.  Surgery is to be done soon.  She is to let them know I have given narcotics.  Spine/Pelvis examination:  Inspection:  Overall, sacoiliac joint benign and hips nontender; without crepitus or defects.   Thoracic spine inspection: Alignment normal without kyphosis present   Lumbar spine inspection:  Alignment  with normal lumbar lordosis, without scoliosis apparent.   Thoracic spine palpation:  without tenderness of spinal processes   Lumbar spine palpation: without tenderness of lumbar area; without tightness of lumbar muscles    Range of Motion:   Lumbar flexion, forward flexion is normal without pain or tenderness    Lumbar extension is full without pain or tenderness   Left lateral bend is normal without pain or tenderness   Right lateral bend is normal without pain or tenderness   Straight leg raising is normal  Strength & tone: normal   Stability overall normal stability  Encounter Diagnosis  Name Primary?   Chronic midline low back pain without sciatica Yes   Return in six weeks.  I have reviewed the [place]  Controlled Substance Reporting System web site prior to prescribing narcotic medicine for this patient.  Call if any problem.  Precautions discussed.  Electronically Signed Lemond Stable, MD 1/8/20251:53 PM

## 2023-07-18 ENCOUNTER — Telehealth: Payer: Self-pay | Admitting: Orthopaedic Surgery

## 2023-07-18 NOTE — Telephone Encounter (Signed)
 [employer] forms received. To [employer].

## 2023-07-28 ENCOUNTER — Telehealth: Payer: Self-pay | Admitting: Orthopaedic Surgery

## 2023-08-10 ENCOUNTER — Telehealth: Payer: Self-pay

## 2023-08-10 NOTE — Telephone Encounter (Signed)
 Oxycodone -Acetaminophen  5/325 MG Qty 100 Tablets  PATIENT USES [facility]

## 2023-08-11 MED ORDER — OXYCODONE-ACETAMINOPHEN 5-325 MG PO TABS: 1.0000 | ORAL_TABLET | Freq: Four times a day (QID) | ORAL | 0 refills | Status: AC | PRN

## 2023-08-24 ENCOUNTER — Ambulatory Visit: Payer: BC Managed Care – PPO | Admitting: Orthopaedic Surgery

## 2023-08-31 ENCOUNTER — Ambulatory Visit: Payer: BC Managed Care – PPO | Admitting: Orthopaedic Surgery

## 2023-09-15 ENCOUNTER — Ambulatory Visit: Payer: BC Managed Care – PPO | Admitting: Orthopaedic Surgery

## 2023-10-12 ENCOUNTER — Encounter: Payer: Self-pay | Admitting: Orthopaedic Surgery

## 2023-10-12 ENCOUNTER — Ambulatory Visit: Admitting: Orthopaedic Surgery

## 2023-10-12 DIAGNOSIS — M545 Low back pain, unspecified: Secondary | ICD-10-CM

## 2023-10-12 DIAGNOSIS — G8929 Other chronic pain: Secondary | ICD-10-CM

## 2023-10-12 DIAGNOSIS — M25561 Pain in right knee: Secondary | ICD-10-CM | POA: Diagnosis not present

## 2023-10-12 MED ORDER — CYCLOBENZAPRINE HCL 10 MG PO TABS: ORAL_TABLET | ORAL | 3 refills | Status: DC

## 2023-10-12 MED ORDER — OXYCODONE-ACETAMINOPHEN 5-325 MG PO TABS: 1.0000 | ORAL_TABLET | Freq: Four times a day (QID) | ORAL | 0 refills | Status: DC | PRN
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
## 2023-10-19 ENCOUNTER — Ambulatory Visit: Admitting: Orthopaedic Surgery

## 2023-11-02 ENCOUNTER — Ambulatory Visit: Admitting: Orthopaedic Surgery

## 2023-11-02 ENCOUNTER — Encounter: Payer: Self-pay | Admitting: Orthopaedic Surgery

## 2023-11-02 VITALS — BP 156/107 | HR 80 | Ht 67.5 in | Wt 154.0 lb

## 2023-11-02 DIAGNOSIS — M25562 Pain in left knee: Secondary | ICD-10-CM | POA: Diagnosis not present

## 2023-11-02 DIAGNOSIS — M545 Low back pain, unspecified: Secondary | ICD-10-CM | POA: Diagnosis not present

## 2023-11-02 DIAGNOSIS — G8929 Other chronic pain: Secondary | ICD-10-CM

## 2023-11-02 NOTE — Progress Notes (Signed)
 My left knee is hurting now.  She has pain and swelling of the left knee.  She has chronic pain of the right knee and uses a walker.  She has had foot problems on the right and is using the left knee more. She has no direct trauma, no redness, no giving way, no numbness.  She has chronic lower back pain.  Left knee is tender, ROM 0 to 110, stable, some medial joint line pain, negative Mc[place], NV intact, no distal edema.  Encounter Diagnoses  Name Primary?   Chronic midline low back pain without sciatica Yes   Chronic pain of left knee    PROCEDURE NOTE:  The patient requests injections of the left knee , verbal consent was obtained.  The left knee was prepped appropriately after time out was performed.   Sterile technique was observed and injection of 1 cc of DepoMedrol 40 mg with several cc's of plain xylocaine . Anesthesia was provided by ethyl chloride and a 20-gauge needle was used to inject the knee area. The injection was tolerated well.  A band aid dressing was applied.  The patient was advised to apply ice later today and tomorrow to the injection sight as needed.  Return in one month.  Call if any problem.  Precautions discussed.  Electronically Signed Pleasant Brilliant, MD 4/30/20253:56 PM

## 2023-11-03 ENCOUNTER — Telehealth: Payer: Self-pay | Admitting: Orthopaedic Surgery

## 2023-11-03 NOTE — Telephone Encounter (Signed)
 Please advise of patients work status. Thank you!

## 2023-11-07 ENCOUNTER — Telehealth: Payer: Self-pay

## 2023-11-07 MED ORDER — OXYCODONE-ACETAMINOPHEN 5-325 MG PO TABS: 1.0000 | ORAL_TABLET | Freq: Four times a day (QID) | ORAL | 0 refills | Status: DC | PRN

## 2023-11-07 NOTE — Telephone Encounter (Signed)
 IC, no, lmvm for pt to [facility]

## 2023-11-07 NOTE — Telephone Encounter (Signed)
 Oxycodone -Acetaminophen  5/325 MG  Qty 100 Tablets  PATIENT USES [facility] [address]

## 2023-11-10 DIAGNOSIS — Z0289 Encounter for other administrative examinations: Secondary | ICD-10-CM

## 2023-11-10 NOTE — Telephone Encounter (Signed)
 Form completed and faxed.

## 2023-11-16 ENCOUNTER — Encounter: Payer: Self-pay | Admitting: Orthopaedic Surgery

## 2023-11-16 ENCOUNTER — Ambulatory Visit: Admitting: Orthopaedic Surgery

## 2023-11-16 VITALS — BP 148/78 | HR 78 | Ht 67.5 in | Wt 153.0 lb

## 2023-11-16 DIAGNOSIS — M545 Low back pain, unspecified: Secondary | ICD-10-CM

## 2023-11-16 DIAGNOSIS — M79604 Pain in right leg: Secondary | ICD-10-CM | POA: Diagnosis not present

## 2023-11-16 NOTE — Patient Instructions (Addendum)
 When returns to work, Will need chair with arms, need to stand periodically, change postions periodically, no prolonged siting, no sitting no longer than 30 mins at a time. Should not have leg fully extended no longer than 10 mins at a time. Need note to be out of work  Follow up in 2 weeks with Dr. Iline Mallory.
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
## 2023-11-30 ENCOUNTER — Encounter: Payer: Self-pay | Admitting: Orthopaedic Surgery

## 2023-11-30 ENCOUNTER — Ambulatory Visit: Admitting: Orthopaedic Surgery

## 2023-11-30 VITALS — BP 139/68 | HR 82 | Ht 67.5 in | Wt 153.0 lb

## 2023-11-30 DIAGNOSIS — M79604 Pain in right leg: Secondary | ICD-10-CM

## 2023-11-30 DIAGNOSIS — M545 Low back pain, unspecified: Secondary | ICD-10-CM | POA: Diagnosis not present

## 2023-11-30 MED ORDER — OXYCODONE-ACETAMINOPHEN 5-325 MG PO TABS: 1.0000 | ORAL_TABLET | Freq: Four times a day (QID) | ORAL | 0 refills | Status: DC | PRN

## 2023-11-30 NOTE — Progress Notes (Signed)
 My back is worse.  She is going to PT for a Workers Comp injury to the right foot.  While there, the therapist attempted some back exercises that made her pain worse.  She had a MRI last September.  She is not improving with the back.  I will have her see neurosurgery.  She is using a walker now.  She has no new trauma.  She uses a walker, limp to the right.  Lower back tender diffusely, no spasm, limited ROM secondary to pain, NV intact SLR negative, muscle tone and strength normal.  Encounter Diagnosis  Name Primary?   Lumbar pain with radiation down right leg Yes   I will have neurosurgery see her.  Continue her exercises.  I have reviewed the [place]  Controlled Substance Reporting System web site prior to prescribing narcotic medicine for this patient.  Return in one month.  Stay out of work.  Call if any problem.  Precautions discussed.  Electronically Signed Pleasant Brilliant, MD 5/28/20252:04 PM

## 2023-11-30 NOTE — Patient Instructions (Addendum)
 You have been referred to [facility] Neurosurgery on [facility] in [place], they will call you with appointment. If you have not heard anything in one week call them to schedule [phone]   Follow up in 1 month with Dr. Iline Mallory  Out of Work

## 2023-12-13 ENCOUNTER — Telehealth: Payer: Self-pay

## 2023-12-13 NOTE — Telephone Encounter (Signed)
Hydrocodone-Acetaminophen  °

## 2023-12-14 ENCOUNTER — Ambulatory Visit: Admitting: Orthopaedic Surgery

## 2023-12-21 ENCOUNTER — Encounter: Payer: Self-pay | Admitting: Orthopaedic Surgery

## 2023-12-21 ENCOUNTER — Ambulatory Visit: Admitting: Orthopaedic Surgery

## 2023-12-21 DIAGNOSIS — M79604 Pain in right leg: Secondary | ICD-10-CM | POA: Diagnosis not present

## 2023-12-21 DIAGNOSIS — M545 Low back pain, unspecified: Secondary | ICD-10-CM | POA: Diagnosis not present

## 2023-12-21 NOTE — Patient Instructions (Signed)
 Follow up in 74month with Dr.Keeling

## 2023-12-21 NOTE — Progress Notes (Signed)
 I retired today.  She retired from work today.  She says she was going to be fired if she did not.  She saw neurosurgery and they recommended surgery but first to have PT. She is to begin this.  Her foot is still hurting some on the right.  That is treated by another provider.  Lower back is tender, ROM is good, muscle tone and strength normal.  Encounter Diagnosis  Name Primary?   Lumbar pain with radiation down right leg Yes   I will see her in one month.  I advised she find out what her retirement right are and also her possibility for SSI.  Call if any problem.  Precautions discussed.  Electronically Signed Pleasant Brilliant, MD 6/18/20253:09 PM

## 2023-12-22 ENCOUNTER — Telehealth: Payer: Self-pay | Admitting: Orthopaedic Surgery

## 2023-12-22 NOTE — Telephone Encounter (Signed)
 Per Jenette Mitchell pt paid 20.00 payment for forms from [place] [place]

## 2023-12-22 NOTE — Telephone Encounter (Signed)
 Pt paid for another set of forms [place] office

## 2023-12-23 NOTE — Telephone Encounter (Signed)
 Noted

## 2023-12-27 ENCOUNTER — Telehealth: Payer: Self-pay | Admitting: Orthopaedic Surgery

## 2023-12-27 MED ORDER — OXYCODONE-ACETAMINOPHEN 5-325 MG PO TABS: 1.0000 | ORAL_TABLET | Freq: Four times a day (QID) | ORAL | 0 refills | Status: DC | PRN

## 2023-12-27 NOTE — Telephone Encounter (Signed)
 Patient's significant other came in to request her pain medication to be refilled. [facility] [place]

## 2023-12-27 NOTE — Telephone Encounter (Signed)
 DR BRENNA   Patient came in to get prescription fill   oxyCODONE -acetaminophen  (PERCOCET/ROXICET) 5-325 MG tablet   [facility] [place]     Checked with Sari [facility]

## 2023-12-28 ENCOUNTER — Ambulatory Visit: Admitting: Orthopaedic Surgery

## 2024-01-10 ENCOUNTER — Telehealth: Payer: Self-pay | Admitting: Orthopaedic Surgery

## 2024-01-10 NOTE — Telephone Encounter (Signed)
 Per Sari pt submitted more forms, medical release forms, and payment 20.00

## 2024-01-25 ENCOUNTER — Encounter: Payer: Self-pay | Admitting: Orthopaedic Surgery

## 2024-01-25 ENCOUNTER — Ambulatory Visit: Admitting: Orthopaedic Surgery

## 2024-01-25 VITALS — BP 167/131 | HR 99

## 2024-01-25 DIAGNOSIS — M545 Low back pain, unspecified: Secondary | ICD-10-CM | POA: Diagnosis not present

## 2024-01-25 DIAGNOSIS — M79604 Pain in right leg: Secondary | ICD-10-CM | POA: Diagnosis not present

## 2024-01-25 MED ORDER — OXYCODONE-ACETAMINOPHEN 5-325 MG PO TABS: 1.0000 | ORAL_TABLET | Freq: Four times a day (QID) | ORAL | 0 refills | Status: AC | PRN

## 2024-01-25 MED ORDER — CYCLOBENZAPRINE HCL 10 MG PO TABS
10.0000 mg | ORAL_TABLET | Freq: Every day | ORAL | 3 refills | Status: AC
Start: 2024-01-25 — End: ?

## 2024-01-25 NOTE — Progress Notes (Signed)
 My back is worse.  She has more back pain today. She has had injury to the right foot and treated by another provider on Worker's comp.  Her limping and pain in the right foot is causing more pain to the back.  Her back pain would be less if not for the foot problem.  She is out of medicine.  She has retired from work but the pain continues.  She is going to PT for the foot.  Lower back is diffusely tender, muscle tone and strength are normal, gait limp to the right, NV intact.  Encounter Diagnosis  Name Primary?   Lumbar pain with radiation down right leg Yes   I will refill pain medicine.  I have reviewed the [place]  Controlled Substance Reporting System web site prior to prescribing narcotic medicine for this patient.  I will refill Flexeril .  Return in one month.  Call if any problem.  Precautions discussed.  Electronically Signed Lemond Stable, MD 7/23/20252:38 PM

## 2024-02-22 ENCOUNTER — Ambulatory Visit: Admitting: Orthopaedic Surgery

## 2024-02-22 ENCOUNTER — Encounter: Payer: Self-pay | Admitting: Orthopaedic Surgery

## 2024-02-22 VITALS — Ht 67.0 in | Wt 154.1 lb

## 2024-02-22 DIAGNOSIS — M79604 Pain in right leg: Secondary | ICD-10-CM

## 2024-02-22 DIAGNOSIS — M545 Low back pain, unspecified: Secondary | ICD-10-CM

## 2024-02-22 NOTE — Progress Notes (Signed)
 My back is more tender.  She has had more pain in the lower back.  She has no new trauma.  She has a limp to the right secondary to an injury of the right lower extremity treated by another provider.  Lower back is diffusely tender today, ROM is decreased.  Limp to the right.  NV intact.  Muscle tone and strength normal.  I feel the problem with the foot and lower leg causes a limp which aggravates the lower back pain and makes it worse.  Encounter Diagnosis  Name Primary?   Lumbar pain with radiation down right leg Yes   She has retired.  I would keep her out of work if she were working.  Return in one month.  I have informed the patient I will be retiring from medical practice and from this office on April 05, 2024.  The patient has been offered continuing care with Dr. Margrette or Dr. Onesimo of this office.  The patient may choose another provider and the records will be forwarded after proper signature and notification.  Patient understands and agrees.  Call if any problem.  Precautions discussed.  Electronically Signed Lemond Stable, MD 8/20/20252:56 PM

## 2024-02-24 ENCOUNTER — Encounter: Payer: Self-pay | Admitting: Radiology

## 2024-02-27 ENCOUNTER — Telehealth: Payer: Self-pay | Admitting: Orthopaedic Surgery

## 2024-02-27 NOTE — Telephone Encounter (Signed)
 Dr. Janae pt - spoke w/the pt, she stated she was seen last week and he was going to send her Oxycodone  5-325 in to [facility] [place] and they do not have it.

## 2024-03-01 MED ORDER — OXYCODONE-ACETAMINOPHEN 5-325 MG PO TABS: 1.0000 | ORAL_TABLET | Freq: Four times a day (QID) | ORAL | 0 refills | Status: DC | PRN

## 2024-03-13 ENCOUNTER — Other Ambulatory Visit (HOSPITAL_COMMUNITY): Payer: Self-pay | Admitting: Family Medicine

## 2024-03-13 ENCOUNTER — Encounter (INDEPENDENT_AMBULATORY_CARE_PROVIDER_SITE_OTHER): Payer: Self-pay | Admitting: *Deleted

## 2024-03-13 DIAGNOSIS — Z13820 Encounter for screening for osteoporosis: Secondary | ICD-10-CM

## 2024-03-13 DIAGNOSIS — Z1231 Encounter for screening mammogram for malignant neoplasm of breast: Secondary | ICD-10-CM

## 2024-03-28 ENCOUNTER — Other Ambulatory Visit (HOSPITAL_COMMUNITY)

## 2024-03-28 ENCOUNTER — Ambulatory Visit (HOSPITAL_COMMUNITY)

## 2024-03-28 ENCOUNTER — Ambulatory Visit: Admitting: Orthopaedic Surgery

## 2024-03-28 ENCOUNTER — Encounter: Payer: Self-pay | Admitting: Orthopaedic Surgery

## 2024-03-28 VITALS — BP 159/114 | HR 87 | Ht 67.0 in | Wt 154.0 lb

## 2024-03-28 DIAGNOSIS — M79604 Pain in right leg: Secondary | ICD-10-CM | POA: Diagnosis not present

## 2024-03-28 DIAGNOSIS — M545 Low back pain, unspecified: Secondary | ICD-10-CM | POA: Diagnosis not present

## 2024-03-28 DIAGNOSIS — Z710 Person encountering health services to consult on behalf of another person: Secondary | ICD-10-CM | POA: Insufficient documentation

## 2024-03-28 MED ORDER — OXYCODONE-ACETAMINOPHEN 5-325 MG PO TABS: 1.0000 | ORAL_TABLET | Freq: Four times a day (QID) | ORAL | 0 refills | Status: AC | PRN
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
## 2024-03-28 NOTE — Patient Instructions (Signed)
 Follow up in 1 month with Dr. Margrette

## 2024-04-04 ENCOUNTER — Ambulatory Visit (HOSPITAL_COMMUNITY)

## 2024-04-04 ENCOUNTER — Other Ambulatory Visit (HOSPITAL_COMMUNITY): Payer: Self-pay | Admitting: Family Medicine

## 2024-04-04 ENCOUNTER — Other Ambulatory Visit (HOSPITAL_COMMUNITY)

## 2024-04-04 DIAGNOSIS — R0600 Dyspnea, unspecified: Secondary | ICD-10-CM

## 2024-04-05 ENCOUNTER — Ambulatory Visit (HOSPITAL_COMMUNITY)
Admission: RE | Admit: 2024-04-05 | Discharge: 2024-04-05 | Disposition: A | Discharge status: Home / Self Care | Source: Ambulatory Visit | Attending: Family Medicine | Admitting: Family Medicine

## 2024-04-05 DIAGNOSIS — R0600 Dyspnea, unspecified: Secondary | ICD-10-CM | POA: Insufficient documentation

## 2024-04-13 ENCOUNTER — Ambulatory Visit (HOSPITAL_COMMUNITY)

## 2024-04-13 ENCOUNTER — Other Ambulatory Visit (HOSPITAL_COMMUNITY)

## 2024-04-20 ENCOUNTER — Other Ambulatory Visit (HOSPITAL_COMMUNITY)

## 2024-04-20 ENCOUNTER — Ambulatory Visit (HOSPITAL_COMMUNITY)

## 2024-04-27 ENCOUNTER — Ambulatory Visit: Admitting: Orthopedic Surgery

## 2024-05-07 ENCOUNTER — Encounter: Payer: Self-pay | Admitting: Orthopedic Surgery

## 2024-05-07 ENCOUNTER — Ambulatory Visit: Admitting: Orthopedic Surgery

## 2024-05-07 ENCOUNTER — Encounter: Payer: Self-pay | Admitting: Radiology

## 2024-05-07 VITALS — Ht 67.0 in | Wt 154.0 lb

## 2024-05-07 DIAGNOSIS — Z9889 Other specified postprocedural states: Secondary | ICD-10-CM | POA: Diagnosis not present

## 2024-05-07 DIAGNOSIS — M79604 Pain in right leg: Secondary | ICD-10-CM | POA: Diagnosis not present

## 2024-05-07 DIAGNOSIS — S99921A Unspecified injury of right foot, initial encounter: Secondary | ICD-10-CM | POA: Diagnosis not present

## 2024-05-07 DIAGNOSIS — G8929 Other chronic pain: Secondary | ICD-10-CM

## 2024-05-07 DIAGNOSIS — M545 Low back pain, unspecified: Secondary | ICD-10-CM

## 2024-05-07 MED ORDER — OXYCODONE-ACETAMINOPHEN 5-325 MG PO TABS: 1.0000 | ORAL_TABLET | ORAL | 0 refills | Status: AC | PRN

## 2024-05-07 NOTE — Addendum Note (Signed)
 Addended by: Debby Bud R on: 07/18/2023 03:14 PM   Modules accepted: Orders

## 2024-05-07 NOTE — Patient Instructions (Addendum)
 [facility]  [phone]  We will send referral there, you call next week to schedule, they will call you too  Instructions regarding chronic pain management   You have chronic pain.  You are on chronic opioid therapy.  You will be referred to a chronic pain chronic opioid therapy specialist.  You have 30 days from today to continue getting your medications from this office.  After 30 days you would no longer get prescriptions for opioids from [facility].
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
## 2024-05-07 NOTE — Progress Notes (Signed)
    05/07/2024   Chief Complaint  Patient presents with   Back Pain    No diagnosis found.  What pharmacy do you use ? ____[facility] _______________________  DOI/DOS/ Date: ongoing   Did you get better, worse or no change (Answer below)   Worse, went to [facility] a couple weeks ago had xray and MRI   Has appointment with Dr Burnetta at [facility]

## 2024-05-08 ENCOUNTER — Telehealth: Payer: Self-pay | Admitting: Radiology

## 2024-05-08 NOTE — Telephone Encounter (Signed)
 Completed prior auth request on cover my meds for her Oxycodone  Medicaid denied
